# Patient Record
Sex: Female | Born: 2000 | Race: Black or African American | Hispanic: No | Marital: Single | Smoking: Never smoker
Health system: Southern US, Community
[De-identification: ages and names within clinical notes are randomized; demographics above are authoritative.]

## PROBLEM LIST (undated history)

## (undated) DIAGNOSIS — F419 Anxiety disorder, unspecified: Secondary | ICD-10-CM

## (undated) DIAGNOSIS — Z789 Other specified health status: Secondary | ICD-10-CM

## (undated) HISTORY — PX: NO PAST SURGERIES: SHX2092

## (undated) HISTORY — DX: Anxiety disorder, unspecified: F41.9

---
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
## 2019-02-19 ENCOUNTER — Emergency Department (HOSPITAL_COMMUNITY)
Admission: EM | Admit: 2019-02-19 | Discharge: 2019-02-19 | Disposition: A | Discharge status: Home / Self Care | Payer: Medicaid Other | Attending: Emergency Medicine | Admitting: Emergency Medicine

## 2019-02-19 ENCOUNTER — Other Ambulatory Visit: Payer: Self-pay

## 2019-02-19 ENCOUNTER — Encounter (HOSPITAL_COMMUNITY): Payer: Self-pay | Admitting: *Deleted

## 2019-02-19 DIAGNOSIS — Z79899 Other long term (current) drug therapy: Secondary | ICD-10-CM | POA: Diagnosis not present

## 2019-02-19 DIAGNOSIS — Y939 Activity, unspecified: Secondary | ICD-10-CM | POA: Insufficient documentation

## 2019-02-19 DIAGNOSIS — S61307A Unspecified open wound of left little finger with damage to nail, initial encounter: Secondary | ICD-10-CM | POA: Diagnosis not present

## 2019-02-19 DIAGNOSIS — S61317A Laceration without foreign body of left little finger with damage to nail, initial encounter: Secondary | ICD-10-CM | POA: Diagnosis not present

## 2019-02-19 DIAGNOSIS — Y929 Unspecified place or not applicable: Secondary | ICD-10-CM | POA: Diagnosis not present

## 2019-02-19 DIAGNOSIS — Y999 Unspecified external cause status: Secondary | ICD-10-CM | POA: Insufficient documentation

## 2019-02-19 DIAGNOSIS — X58XXXA Exposure to other specified factors, initial encounter: Secondary | ICD-10-CM | POA: Diagnosis not present

## 2019-02-19 DIAGNOSIS — S60922A Unspecified superficial injury of left hand, initial encounter: Secondary | ICD-10-CM | POA: Diagnosis present

## 2019-02-19 DIAGNOSIS — S61309A Unspecified open wound of unspecified finger with damage to nail, initial encounter: Secondary | ICD-10-CM

## 2019-02-19 MED ORDER — IBUPROFEN 800 MG PO TABS
800.0000 mg | ORAL_TABLET | Freq: Once | ORAL | Status: AC
  Administered 2019-02-19: 800 mg via ORAL
  Filled 2019-02-19: qty 1

## 2019-02-19 NOTE — Discharge Instructions (Addendum)
Keep a dressing on your finger to prevent re-injury of the nail.  Wash daily with soap and water, applying a new dressing each day.  A new nail will grow back in several months.  Use tylenol and ibuprofen for pain and swelling.  Use ice for pain and swelling.  Keep your pinky elevated when able.  Return to the ER if you develop fevers, severe worsening pain, pus draining form the area, it becomes very red/swollen, or with any new, worsening, or concerning symptoms.

## 2019-02-19 NOTE — ED Provider Notes (Signed)
[facility] EMERGENC[facility] DEPART[facility] Provider Note   CSN: [id number] Arrival date & time: 02/19/19  1761    History   Chief Complaint Chief Complaint  Patient presents with  . Nail Problem    HPI Rhonda Dixon is a 18 y.o. female presenting for evaluation of pinky injury.  Patient states just prior to arrival she jammed her left pinky, causing her fake nail attached to her real nail to lift up from the nailbed.  She reports acute onset pain and bleeding.  She soaked it in salt water, which stopped the bleeding.  She reports continued pain at the nail bed.  She denies injury elsewhere.  She denies numbness or tingling.  She has no medical problems, takes medications daily.  She is not on blood thinners.     HPI  History reviewed. No pertinent past medical history.  Patient Active Problem List   Diagnosis Date Noted  . Cough 11/22/2018  . Generalized muscle ache 11/22/2018  . Generalized anxiety disorder 10/23/2018  . Current severe episode of major depressive disorder without psychotic features without prior episode ([place]) 10/23/2018  . Hematuria 08/29/2018  . External hemorrhoid 08/22/2018  . 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 ALLERGIC RHINITIS, SEASONAL 04/01/2009    History reviewed. No pertinent surgical history.   OB History   No obstetric history on file.      Home Medications    Prior to Admission medications   Medication Sig Start Date End Date Taking? Authorizing Provider  cetirizine (ZYRTEC) 10 MG tablet Take 1 tablet (10 mg total) by mouth daily. 11/22/18   Everrett Coombe, MD  escitalopram (LEXAPRO) 10 MG tablet Take 0.5 tablets (5 mg total) by mouth daily. 10/23/18   Kathrene Alu, MD  ipratropium (ATROVENT) 0.06 % nasal spray Place 2 sprays into both nostrils 4 (four) times daily. 07/26/18   Tasia Catchings, Amy V, PA-C  omeprazole (PRILOSEC) 20 MG capsule Take 1 capsule (20 mg total) by mouth daily. 08/29/18   Steve Rattler, DO  ondansetron  (ZOFRAN) 4 MG tablet Take 1 tablet (4 mg total) by mouth every 8 (eight) hours as needed for nausea or vomiting. 08/29/18   Lucila Maine C, DO  polyethylene glycol powder (GLYCOLAX/MIRALAX) powder Take 17 g by mouth daily. 08/29/18   Steve Rattler, DO  predniSONE (DELTASONE) 50 MG tablet Take 1 tablet (50 mg total) by mouth daily. 07/26/18   Ok Edwards, PA-C    Family History Family History  Problem Relation Age of Onset  . Lupus Mother     Social History Social History   Tobacco Use  . Smoking status: Never Smoker  . Smokeless tobacco: Never Used  Substance Use Topics  . Alcohol use: No  . Drug use: No     Allergies   Patient has no known allergies.   Review of Systems Review of Systems  Skin:       Nail injury of L pinky  Hematological: Does not bruise/bleed easily.     Physical Exam Updated Vital Signs BP 125/78 (BP Location: Right Arm)   Pulse 95   Temp 98.5 F (36.9 C) (Oral)   Resp 14   Ht 5\' 4"  (1.626 m)   Wt 65.8 kg   SpO2 100%   BMI 24.89 kg/m   Physical Exam Vitals signs and nursing note reviewed.  Constitutional:      General: She is not in acute distress.    Appearance: She is well-developed.  HENT:  Head: Normocephalic and atraumatic.  Neck:     Musculoskeletal: Normal range of motion.  Pulmonary:     Effort: Pulmonary effort is normal.  Abdominal:     General: There is no distension.  Musculoskeletal: Normal range of motion.     Comments: Active range of motion of the pinky.  Distal sensation intact.  Good distal cap refill.  No tenderness palpation of the PIP or DIP.  Skin:    General: Skin is warm.     Capillary Refill: Capillary refill takes less than 2 seconds.     Findings: No rash.     Comments: Left pinky nail avulsed all except at the base of the nailbed.  No active bleeding.  Acrylic nail firmly attached to the real nail.  Neurological:     Mental Status: She is alert and oriented to person, place, and time.       ED Treatments / Results  Labs (all labs ordered are listed, but only abnormal results are displayed) Labs Reviewed - No data to display  EKG None  Radiology No results found.  Procedures Procedures (including critical care time)  Medications Ordered in ED Medications  ibuprofen (ADVIL) tablet 800 mg (800 mg Oral Given 02/19/19 [id number])     Initial Impression / Assessment and Plan / ED Course  I have reviewed the triage vital signs and the nursing notes.  Pertinent labs & imaging results that were available during my care of the patient were reviewed by me and considered in my medical decision making (see chart for details).        Patient presenting for evaluation of pinky nail injury.  Physical exam reassuring, she is neurovascularly intact.  On exam, she has almost complete avulsion of the pinky nail.  Discussed options of nail removal versus anchoring nail with sutures versus bulky dressing to apply pressure and decreased movement of the nail.  Patient does not want sutures or nail removal.  Discussed likelihood that nail will fall off, and that it will take several months for her new nail to grow back in.  Pt does not want to attempt removal of acrylic nail at this time. Discussed symptomatic treatment with daily cleaning, dressing changes, Tylenol, ibuprofen, elevation, and ice.  Encouraged close monitoring for signs of infection.  At this time, patient appears safe for discharge.  Return precautions given.  Patient states she understands and agrees to plan.   Final Clinical Impressions(s) / ED Diagnoses   Final diagnoses:  Avulsion of fingernail, initial encounter    ED Discharge Orders    None       Alveria ApleyCaccavale, Victorious Cosio, PA-C 02/19/19 1004    Pricilla LovelessGoldston, Scott, MD 02/22/19 [phone]

## 2019-02-19 NOTE — ED Triage Notes (Signed)
States she was wrestling around with a friend and her pinkly fingernail on the left got pushed backwards.

## 2019-02-19 NOTE — ED Triage Notes (Signed)
Pt in with L pinky nail injury. States her fake nail got pulled back, also pulling up her real nail from the bed.

## 2019-03-09 ENCOUNTER — Ambulatory Visit (INDEPENDENT_AMBULATORY_CARE_PROVIDER_SITE_OTHER): Payer: Medicaid Other | Admitting: Family Medicine

## 2019-03-09 ENCOUNTER — Other Ambulatory Visit: Payer: Self-pay

## 2019-03-09 VITALS — BP 100/70 | HR 94 | Temp 94.0°F

## 2019-03-09 DIAGNOSIS — F411 Generalized anxiety disorder: Secondary | ICD-10-CM

## 2019-03-09 DIAGNOSIS — N898 Other specified noninflammatory disorders of vagina: Secondary | ICD-10-CM

## 2019-03-09 MED ORDER — FLUCONAZOLE 150 MG PO TABS: 150.0000 mg | ORAL_TABLET | Freq: Once | ORAL | 0 refills | Status: AC

## 2019-03-09 NOTE — Assessment & Plan Note (Signed)
History appears consistent with vaginal candidiasis.  She was advised to move forward with comprehensive STI treatment but she preferred to hold off on any STI work-up because she was recently tested and has not had any new partners.  She has never had a speculum exam performed before and there are significant difficulties with this particular exam.  A standard and small speculum were used unsuccessfully.  In order to avoid additional trauma, she will be treated empirically for yeast infection was encouraged to return if she did not experience resolution of her symptoms. -Diflucan 150 once

## 2019-03-09 NOTE — Progress Notes (Signed)
    Subjective:  Rhonda Dixon is a 18 y.o. female who presents to the [facility] today with a chief complaint of vaginal discharge.   HPI: She reports that she has been having dysuria, vaginal itching/irritation, clear chunky discharge for about 2 weeks.  She reports this feels very similar to previous yeast infection.  She reports that she has recently been screened for gonorrhea, chlamydia, HIV, RPR.  She denies fevers, abdominal pain, back pain.  She reports that she is sexually active with another female.  She reports that there is no way she could be pregnant.  Chief Complaint noted Review of Symptoms - see HPI  Objective:  Physical Exam: BP 100/70   Pulse 94   Temp (!) 94 F (34.4 C)   SpO2 98%    Gen: NAD, resting comfortably Pelvic: Normal vaginal appearance and healthy appearing mucosa.  No significant malodor or discharge appreciated on exam.  Pelvic exam was complicated in the initial speculum was not able to progress.  A smaller speculum was used with the same result.  No results found for this or any previous visit (from the past 72 hour(s)).   Assessment/Plan:  Vaginal discharge History appears consistent with vaginal candidiasis.  She was advised to move forward with comprehensive STI treatment but she preferred to hold off on any STI work-up because she was recently tested and has not had any new partners.  She has never had a speculum exam performed before and there are significant difficulties with this particular exam.  A standard and small speculum were used unsuccessfully.  In order to avoid additional trauma, she will be treated empirically for yeast infection was encouraged to return if she did not experience resolution of her symptoms. -Diflucan 150 once  Generalized anxiety disorder She reports that she has been out of her medication, escitalopram, for about 2 months.  She would like a refill.  She never benefited significantly from this medication.  She was  encouraged to return to clinic at a later date for a more in-depth conversation about her anxiety/depression to fully address this issue.  She denies SI/HI at this time.  She feels comfortable rescheduling for later date.

## 2019-03-09 NOTE — Assessment & Plan Note (Signed)
She reports that she has been out of her medication, escitalopram, for about 2 months.  She would like a refill.  She never benefited significantly from this medication.  She was encouraged to return to clinic at a later date for a more in-depth conversation about her anxiety/depression to fully address this issue.  She denies SI/HI at this time.  She feels comfortable rescheduling for later date.

## 2019-03-09 NOTE — Patient Instructions (Signed)
I have prescribed a one-time pill for a yeast infection. If you do not experience significant symptom resolution in the next 4-5 days, I would consider returning for lab work.    I'm sorry that we did not have more time to discuss your experience with depression so far.  I think it would be really beneficial to have a visit dedicated to that.

## 2019-03-09 NOTE — Progress Notes (Signed)
wet 

## 2019-06-26 ENCOUNTER — Encounter (HOSPITAL_COMMUNITY): Payer: Self-pay

## 2019-06-26 ENCOUNTER — Ambulatory Visit (HOSPITAL_COMMUNITY)
Admission: EM | Admit: 2019-06-26 | Discharge: 2019-06-26 | Disposition: A | Discharge status: Home / Self Care | Payer: Medicaid Other | Attending: Family Medicine | Admitting: Family Medicine

## 2019-06-26 DIAGNOSIS — R5383 Other fatigue: Secondary | ICD-10-CM | POA: Diagnosis not present

## 2019-06-26 DIAGNOSIS — J069 Acute upper respiratory infection, unspecified: Secondary | ICD-10-CM | POA: Insufficient documentation

## 2019-06-26 DIAGNOSIS — R05 Cough: Secondary | ICD-10-CM | POA: Diagnosis not present

## 2019-06-26 DIAGNOSIS — Z20822 Contact with and (suspected) exposure to covid-19: Secondary | ICD-10-CM

## 2019-06-26 DIAGNOSIS — Z20828 Contact with and (suspected) exposure to other viral communicable diseases: Secondary | ICD-10-CM | POA: Diagnosis not present

## 2019-06-26 DIAGNOSIS — J029 Acute pharyngitis, unspecified: Secondary | ICD-10-CM | POA: Diagnosis not present

## 2019-06-26 DIAGNOSIS — R0981 Nasal congestion: Secondary | ICD-10-CM | POA: Diagnosis not present

## 2019-06-26 DIAGNOSIS — R059 Cough, unspecified: Secondary | ICD-10-CM

## 2019-06-26 MED ORDER — PROMETHAZINE-DM 6.25-15 MG/5ML PO SYRP: 5.0000 mL | ORAL_SOLUTION | Freq: Four times a day (QID) | ORAL | 0 refills | Status: DC | PRN

## 2019-06-26 NOTE — ED Triage Notes (Addendum)
Pt reports sore throat, nasal congestion, cough and low energy x 1 week.

## 2019-06-26 NOTE — Discharge Instructions (Addendum)
Take tylenol for pain and fever Take cough medicine as needed Quarantine at home until covid test results available Call for questions     Person Under Monitoring Name: Rhonda Dixon  Location: [address]  [place] [id number]   Infection Prevention Recommendations for Individuals Confirmed to have, or Being Evaluated for, 2019 Novel Coronavirus (COVID-19) Infection Who Receive Care at Home  Individuals who are confirmed to have, or are being evaluated for, COVID-19 should follow the prevention steps below until a healthcare provider or local or state health department says they can return to normal activities.  Stay home except to get medical care You should restrict activities outside your home, except for getting medical care. Do not go to work, school, or public areas, and do not use public transportation or taxis.  Call ahead before visiting your doctor Before your medical appointment, call the healthcare provider and tell them that you have, or are being evaluated for, COVID-19 infection. This will help the healthcare providers office take steps to keep other people from getting infected. Ask your healthcare provider to call the local or state health department.  Monitor your symptoms Seek prompt medical attention if your illness is worsening (e.g., difficulty breathing). Before going to your medical appointment, call the healthcare provider and tell them that you have, or are being evaluated for, COVID-19 infection. Ask your healthcare provider to call the local or state health department.  Wear a facemask You should wear a facemask that covers your nose and mouth when you are in the same room with other people and when you visit a healthcare provider. People who live with or visit you should also wear a facemask while they are in the same room with you.  Separate yourself from other people in your home As much as possible, you should stay in a different room  from other people in your home. Also, you should use a separate bathroom, if available.  Avoid sharing household items You should not share dishes, drinking glasses, cups, eating utensils, towels, bedding, or other items with other people in your home. After using these items, you should wash them thoroughly with soap and water.  Cover your coughs and sneezes Cover your mouth and nose with a tissue when you cough or sneeze, or you can cough or sneeze into your sleeve. Throw used tissues in a lined trash can, and immediately wash your hands with soap and water for at least 20 seconds or use an alcohol-based hand rub.  Wash your [employer] Wash your hands often and thoroughly with soap and water for at least 20 seconds. You can use an alcohol-based hand sanitizer if soap and water are not available and if your hands are not visibly dirty. Avoid touching your eyes, nose, and mouth with unwashed hands.   Prevention Steps for Caregivers and Household Members of Individuals Confirmed to have, or Being Evaluated for, COVID-19 Infection Being Cared for in the Home  If you live with, or provide care at home for, a person confirmed to have, or being evaluated for, COVID-19 infection please follow these guidelines to prevent infection:  Follow healthcare providers instructions Make sure that you understand and can help the patient follow any healthcare provider instructions for all care.  Provide for the patients basic needs You should help the patient with basic needs in the home and provide support for getting groceries, prescriptions, and other personal needs.  Monitor the patients symptoms If they are getting sicker, call his  or her medical provider and tell them that the patient has, or is being evaluated for, COVID-19 infection. This will help the healthcare providers office take steps to keep other people from getting infected. Ask the healthcare provider to call the local or state  health department.  Limit the number of people who have contact with the patient If possible, have only one caregiver for the patient. Other household members should stay in another home or place of residence. If this is not possible, they should stay in another room, or be separated from the patient as much as possible. Use a separate bathroom, if available. Restrict visitors who do not have an essential need to be in the home.  Keep older adults, very young children, and other sick people away from the patient Keep older adults, very young children, and those who have compromised immune systems or chronic health conditions away from the patient. This includes people with chronic heart, lung, or kidney conditions, diabetes, and cancer.  Ensure good ventilation Make sure that shared spaces in the home have good air flow, such as from an air conditioner or an opened window, weather permitting.  Wash your hands often Wash your hands often and thoroughly with soap and water for at least 20 seconds. You can use an alcohol based hand sanitizer if soap and water are not available and if your hands are not visibly dirty. Avoid touching your eyes, nose, and mouth with unwashed hands. Use disposable paper towels to dry your hands. If not available, use dedicated cloth towels and replace them when they become wet.  Wear a facemask and gloves Wear a disposable facemask at all times in the room and gloves when you touch or have contact with the patients blood, body fluids, and/or secretions or excretions, such as sweat, saliva, sputum, nasal mucus, vomit, urine, or feces.  Ensure the mask fits over your nose and mouth tightly, and do not touch it during use. Throw out disposable facemasks and gloves after using them. Do not reuse. Wash your hands immediately after removing your facemask and gloves. If your personal clothing becomes contaminated, carefully remove clothing and launder. Wash your hands  after handling contaminated clothing. Place all used disposable facemasks, gloves, and other waste in a lined container before disposing them with other household waste. Remove gloves and wash your hands immediately after handling these items.  Do not share dishes, glasses, or other household items with the patient Avoid sharing household items. You should not share dishes, drinking glasses, cups, eating utensils, towels, bedding, or other items with a patient who is confirmed to have, or being evaluated for, COVID-19 infection. After the person uses these items, you should wash them thoroughly with soap and water.  Wash laundry thoroughly Immediately remove and wash clothes or bedding that have blood, body fluids, and/or secretions or excretions, such as sweat, saliva, sputum, nasal mucus, vomit, urine, or feces, on them. Wear gloves when handling laundry from the patient. Read and follow directions on labels of laundry or clothing items and detergent. In general, wash and dry with the warmest temperatures recommended on the label.  Clean all areas the individual has used often Clean all touchable surfaces, such as counters, tabletops, doorknobs, bathroom fixtures, toilets, phones, keyboards, tablets, and bedside tables, every day. Also, clean any surfaces that may have blood, body fluids, and/or secretions or excretions on them. Wear gloves when cleaning surfaces the patient has come in contact with. Use a diluted bleach solution (e.g., dilute  bleach with 1 part bleach and 10 parts water) or a household disinfectant with a label that says EPA-registered for coronaviruses. To make a bleach solution at home, add 1 tablespoon of bleach to 1 quart (4 cups) of water. For a larger supply, add  cup of bleach to 1 gallon (16 cups) of water. Read labels of cleaning products and follow recommendations provided on product labels. Labels contain instructions for safe and effective use of the cleaning product  including precautions you should take when applying the product, such as wearing gloves or eye protection and making sure you have good ventilation during use of the product. Remove gloves and wash hands immediately after cleaning.  Monitor yourself for signs and symptoms of illness Caregivers and household members are considered close contacts, should monitor their health, and will be asked to limit movement outside of the home to the extent possible. Follow the monitoring steps for close contacts listed on the symptom monitoring form.   ? If you have additional questions, contact your local health department or call the epidemiologist on call at [phone] (available 24/7). ? This guidance is subject to change. For the most up-to-date guidance from [facility], please refer to their website: [email or web address]

## 2019-06-26 NOTE — ED Provider Notes (Signed)
[facility]    CSN: [id number] Arrival date & time: 06/26/19  0849      History   Chief Complaint Chief Complaint  Patient presents with   Sore Throat   Cough   Fatigue   Nasal Congestion    HPI Rhonda Dixon is a 18 y.o. female.   HPI  Patient states she has had some sweats and chills.  Did not take her temperature.  She has significant fatigue acute.  Cough sore throat and runny nose for about a week.  She tried to go to work today.  She was too sick to continue.  She was sent home and told to get testing.  She does not have any known exposure to coronavirus.  She does work in a [employer] and is around a lot of people.  Everyone at home as well.  Her sense of taste and smell is good.  Appetite poor.  She is drinking fluids.  No nausea vomiting diarrhea.    History reviewed. No pertinent past medical history.  Patient Active Problem List   Diagnosis Date Noted   Cough 11/22/2018   Generalized muscle ache 11/22/2018   Generalized anxiety disorder 10/23/2018   Current severe episode of major depressive disorder without psychotic features without prior episode (HCC) 10/23/2018   Hematuria 08/29/2018   External hemorrhoid 08/22/2018   Vaginal discharge 07/12/2017   Nocturnal enuresis 01/04/2013   ALLERGIC RHINITIS, SEASONAL 04/01/2009    History reviewed. No pertinent surgical history.  OB History   No obstetric history on file.      Home Medications    Prior to Admission medications   Medication Sig Start Date End Date Taking? Authorizing Provider  cetirizine (ZYRTEC) 10 MG tablet Take 1 tablet (10 mg total) by mouth daily. 11/22/18   Howard PouchFeng, Lauren, MD  escitalopram (LEXAPRO) 10 MG tablet Take 0.5 tablets (5 mg total) by mouth daily. 10/23/18   Lennox SoldersWinfrey, Amanda C, MD  ipratropium (ATROVENT) 0.06 % nasal spray Place 2 sprays into both nostrils 4 (four) times daily. 07/26/18   Cathie HoopsYu, Amy V, PA-C  promethazine-dextromethorphan  (PROMETHAZINE-DM) 6.25-15 MG/5ML syrup Take 5 mLs by mouth 4 (four) times daily as needed for cough. 06/26/19   Eustace MooreNelson, Jaleigh Mccroskey Sue, MD  omeprazole (PRILOSEC) 20 MG capsule Take 1 capsule (20 mg total) by mouth daily. 08/29/18 06/26/19  Tillman Sersiccio, Angela C, DO    Family History Family History  Problem Relation Age of Onset   Lupus Mother     Social History Social History   Tobacco Use   Smoking status: Never Smoker   Smokeless tobacco: Never Used  Substance Use Topics   Alcohol use: No   Drug use: No     Allergies   Patient has no known allergies.   Review of Systems Review of Systems  Constitutional: Positive for fatigue and fever. Negative for chills.       Did not take temperature  HENT: Positive for congestion, rhinorrhea and sore throat. Negative for ear pain.   Eyes: Negative for pain and visual disturbance.  Respiratory: Positive for cough. Negative for shortness of breath.   Cardiovascular: Negative for chest pain and palpitations.  Gastrointestinal: Negative for abdominal pain and vomiting.  Genitourinary: Negative for dysuria and hematuria.  Musculoskeletal: Negative for arthralgias and back pain.  Skin: Negative for color change and rash.  Neurological: Negative for seizures, syncope and headaches.  All other systems reviewed and are negative.    Physical Exam Triage Vital  Signs ED Triage Vitals  Enc Vitals Group     BP 06/26/19 0909 102/71     Pulse Rate 06/26/19 0909 78     Resp 06/26/19 0909 15     Temp 06/26/19 0909 97.7 F (36.5 C)     Temp Source 06/26/19 0909 Temporal     SpO2 06/26/19 0909 100 %     Weight --      Height --      Head Circumference --      Peak Flow --      Pain Score 06/26/19 0906 2     Pain Loc --      Pain Edu? --      Excl. in GC? --    No data found.  Updated Vital Signs BP 102/71 (BP Location: Right Arm)    Pulse 78    Temp 97.7 F (36.5 C) (Temporal)    Resp 15    SpO2 100%       Physical  Exam Constitutional:      General: She is not in acute distress.    Appearance: She is well-developed.  HENT:     Head: Normocephalic and atraumatic.     Right Ear: Tympanic membrane and ear canal normal.     Left Ear: Tympanic membrane and ear canal normal.     Nose: Rhinorrhea present.     Mouth/Throat:     Mouth: Mucous membranes are moist.     Pharynx: Posterior oropharyngeal erythema present.     Comments: Mild redness.  No exudate Eyes:     Conjunctiva/sclera: Conjunctivae normal.     Pupils: Pupils are equal, round, and reactive to light.  Neck:     Musculoskeletal: Normal range of motion.  Cardiovascular:     Rate and Rhythm: Normal rate and regular rhythm.  Pulmonary:     Effort: Pulmonary effort is normal. No respiratory distress.     Breath sounds: No wheezing or rales.  Abdominal:     General: There is no distension.     Palpations: Abdomen is soft.  Musculoskeletal: Normal range of motion.  Lymphadenopathy:     Cervical: Cervical adenopathy present.  Skin:    General: Skin is warm and dry.  Neurological:     General: No focal deficit present.     Mental Status: She is alert.  Psychiatric:        Mood and Affect: Mood normal.        Behavior: Behavior normal.     Comments: Appears tired      UC Treatments / Results  Labs (all labs ordered are listed, but only abnormal results are displayed) Labs Reviewed  NOVEL CORONAVIRUS, NAA (HOSP ORDER, SEND-OUT TO REF LAB; TAT 18-24 HRS)    EKG   Radiology No results found.  Procedures Procedures (including critical care time)  Medications Ordered in UC Medications - No data to display  Initial Impression / Assessment and Plan / UC Course  I have reviewed the triage vital signs and the nursing notes.  Pertinent labs & imaging results that were available during my care of the patient were reviewed by me and considered in my medical decision making (see chart for details).     Discussed she likely has  a viral illness.  Impossible to tell if it is coronavirus.  Covid testing is done.  Importance of quarantine is reviewed. Final Clinical Impressions(s) / UC Diagnoses   Final diagnoses:  Cough  Viral upper respiratory tract  infection  Suspected COVID-19 virus infection     Discharge Instructions     Take tylenol for pain and fever Take cough medicine as needed Quarantine at home until covid test results available Call for questions     Person Under Monitoring Name: JAMYIA FORTUNE  Location: [address]  [place] [address]   Infection Prevention Recommendations for Individuals Confirmed to have, or Being Evaluated for, 2019 Novel Coronavirus (COVID-19) Infection Who Receive Care at Home  Individuals who are confirmed to have, or are being evaluated for, COVID-19 should follow the prevention steps below until a healthcare provider or local or state health department says they can return to normal activities.  Stay home except to get medical care You should restrict activities outside your home, except for getting medical care. Do not go to work, school, or public areas, and do not use public transportation or taxis.  Call ahead before visiting your doctor Before your medical appointment, call the healthcare provider and tell them that you have, or are being evaluated for, COVID-19 infection. This will help the healthcare providers office take steps to keep other people from getting infected. Ask your healthcare provider to call the local or state health department.  Monitor your symptoms Seek prompt medical attention if your illness is worsening (e.g., difficulty breathing). Before going to your medical appointment, call the healthcare provider and tell them that you have, or are being evaluated for, COVID-19 infection. Ask your healthcare provider to call the local or state health department.  Wear a facemask You should wear a facemask that covers your nose and  mouth when you are in the same room with other people and when you visit a healthcare provider. People who live with or visit you should also wear a facemask while they are in the same room with you.  Separate yourself from other people in your home As much as possible, you should stay in a different room from other people in your home. Also, you should use a separate bathroom, if available.  Avoid sharing household items You should not share dishes, drinking glasses, cups, eating utensils, towels, bedding, or other items with other people in your home. After using these items, you should wash them thoroughly with soap and water.  Cover your coughs and sneezes Cover your mouth and nose with a tissue when you cough or sneeze, or you can cough or sneeze into your sleeve. Throw used tissues in a lined trash can, and immediately wash your hands with soap and water for at least 20 seconds or use an alcohol-based hand rub.  Wash your [employer] your hands often and thoroughly with soap and water for at least 20 seconds. You can use an alcohol-based hand sanitizer if soap and water are not available and if your hands are not visibly dirty. Avoid touching your eyes, nose, and mouth with unwashed hands.   Prevention Steps for Caregivers and Household Members of Individuals Confirmed to have, or Being Evaluated for, COVID-19 Infection Being Cared for in the Home  If you live with, or provide care at home for, a person confirmed to have, or being evaluated for, COVID-19 infection please follow these guidelines to prevent infection:  Follow healthcare providers instructions Make sure that you understand and can help the patient follow any healthcare provider instructions for all care.  Provide for the patients basic needs You should help the patient with basic needs in the home and provide support for getting groceries, prescriptions,  and other personal needs.  Monitor the patients  symptoms If they are getting sicker, call his or her medical provider and tell them that the patient has, or is being evaluated for, COVID-19 infection. This will help the healthcare providers office take steps to keep other people from getting infected. Ask the healthcare provider to call the local or state health department.  Limit the number of people who have contact with the patient  If possible, have only one caregiver for the patient.  Other household members should stay in another home or place of residence. If this is not possible, they should stay  in another room, or be separated from the patient as much as possible. Use a separate bathroom, if available.  Restrict visitors who do not have an essential need to be in the home.  Keep older adults, very young children, and other sick people away from the patient Keep older adults, very young children, and those who have compromised immune systems or chronic health conditions away from the patient. This includes people with chronic heart, lung, or kidney conditions, diabetes, and cancer.  Ensure good ventilation Make sure that shared spaces in the home have good air flow, such as from an air conditioner or an opened window, weather permitting.  Wash your hands often  Wash your hands often and thoroughly with soap and water for at least 20 seconds. You can use an alcohol based hand sanitizer if soap and water are not available and if your hands are not visibly dirty.  Avoid touching your eyes, nose, and mouth with unwashed hands.  Use disposable paper towels to dry your hands. If not available, use dedicated cloth towels and replace them when they become wet.  Wear a facemask and gloves  Wear a disposable facemask at all times in the room and gloves when you touch or have contact with the patients blood, body fluids, and/or secretions or excretions, such as sweat, saliva, sputum, nasal mucus, vomit, urine, or feces.  Ensure  the mask fits over your nose and mouth tightly, and do not touch it during use.  Throw out disposable facemasks and gloves after using them. Do not reuse.  Wash your hands immediately after removing your facemask and gloves.  If your personal clothing becomes contaminated, carefully remove clothing and launder. Wash your hands after handling contaminated clothing.  Place all used disposable facemasks, gloves, and other waste in a lined container before disposing them with other household waste.  Remove gloves and wash your hands immediately after handling these items.  Do not share dishes, glasses, or other household items with the patient  Avoid sharing household items. You should not share dishes, drinking glasses, cups, eating utensils, towels, bedding, or other items with a patient who is confirmed to have, or being evaluated for, COVID-19 infection.  After the person uses these items, you should wash them thoroughly with soap and water.  Wash laundry thoroughly  Immediately remove and wash clothes or bedding that have blood, body fluids, and/or secretions or excretions, such as sweat, saliva, sputum, nasal mucus, vomit, urine, or feces, on them.  Wear gloves when handling laundry from the patient.  Read and follow directions on labels of laundry or clothing items and detergent. In general, wash and dry with the warmest temperatures recommended on the label.  Clean all areas the individual has used often  Clean all touchable surfaces, such as counters, tabletops, doorknobs, bathroom fixtures, toilets, phones, keyboards, tablets, and bedside tables, every day.  Also, clean any surfaces that may have blood, body fluids, and/or secretions or excretions on them.  Wear gloves when cleaning surfaces the patient has come in contact with.  Use a diluted bleach solution (e.g., dilute bleach with 1 part bleach and 10 parts water) or a household disinfectant with a label that says  EPA-registered for coronaviruses. To make a bleach solution at home, add 1 tablespoon of bleach to 1 quart (4 cups) of water. For a larger supply, add  cup of bleach to 1 gallon (16 cups) of water.  Read labels of cleaning products and follow recommendations provided on product labels. Labels contain instructions for safe and effective use of the cleaning product including precautions you should take when applying the product, such as wearing gloves or eye protection and making sure you have good ventilation during use of the product.  Remove gloves and wash hands immediately after cleaning.  Monitor yourself for signs and symptoms of illness Caregivers and household members are considered close contacts, should monitor their health, and will be asked to limit movement outside of the home to the extent possible. Follow the monitoring steps for close contacts listed on the symptom monitoring form.   ? If you have additional questions, contact your local health department or call the epidemiologist on call at [phone] (available 24/7). ? This guidance is subject to change. For the most up-to-date guidance from [facility], please refer to their website: [email or web address]    ED Prescriptions    Medication Sig Dispense Auth. Provider   promethazine-dextromethorphan (PROMETHAZINE-DM) 6.25-15 MG/5ML syrup Take 5 mLs by mouth 4 (four) times daily as needed for cough. 118 mL Eustace Moore, MD     PDMP not reviewed this encounter.   Eustace Moore, MD 06/26/19 1105

## 2019-06-27 LAB — NOVEL CORONAVIRUS, NAA (HOSP ORDER, SEND-OUT TO REF LAB; TAT 18-24 HRS): SARS-CoV-2, NAA: NOT DETECTED

## 2019-07-25 ENCOUNTER — Ambulatory Visit: Payer: Medicaid Other

## 2019-07-27 ENCOUNTER — Ambulatory Visit: Payer: Medicaid Other | Admitting: Family Medicine

## 2019-07-30 ENCOUNTER — Ambulatory Visit (INDEPENDENT_AMBULATORY_CARE_PROVIDER_SITE_OTHER): Payer: Medicaid Other | Admitting: Student in an Organized Health Care Education/Training Program

## 2019-07-30 ENCOUNTER — Other Ambulatory Visit: Payer: Self-pay

## 2019-07-30 VITALS — BP 110/80 | HR 89 | Wt 159.8 lb

## 2019-07-30 DIAGNOSIS — R112 Nausea with vomiting, unspecified: Secondary | ICD-10-CM | POA: Diagnosis not present

## 2019-07-30 DIAGNOSIS — K219 Gastro-esophageal reflux disease without esophagitis: Secondary | ICD-10-CM | POA: Diagnosis not present

## 2019-07-30 DIAGNOSIS — N898 Other specified noninflammatory disorders of vagina: Secondary | ICD-10-CM

## 2019-07-30 LAB — POCT WET PREP (WET MOUNT)
Clue Cells Wet Prep Whiff POC: NEGATIVE
Trichomonas Wet Prep HPF POC: ABSENT

## 2019-07-30 LAB — POCT URINE PREGNANCY: Preg Test, Ur: NEGATIVE

## 2019-07-30 MED ORDER — FLUCONAZOLE 150 MG PO TABS: 150.0000 mg | ORAL_TABLET | Freq: Once | ORAL | 0 refills | Status: AC

## 2019-07-30 NOTE — Patient Instructions (Addendum)
It was a pleasure to see you today!  To summarize our discussion for this visit:  We are testing for gonorrhea, chlamydia, yeast, trichomonas.  I will mail you results unless there is an abnormal and I will call you.  I highly recommend you consider trying a type of birth control if you do not want to become pregnant.  I have provided some information on different options here.  I recommend trying over-the-counter acid reflux treatments.  If that does not seem like it is helping please come back to see your PCP.  Some additional health maintenance measures we should update are: Health Maintenance Due  Topic Date Due  . INFLUENZA VACCINE  04/14/2019  .    Call the clinic at [phone] if your symptoms worsen or you have any concerns.   Thank you for allowing me to take part in your care,  Dr. Jamelle Rushinghelsey Anderson   Contraception Choices Contraception, also called birth control, means things to use or ways to try not to get pregnant. Hormonal birth control This kind of birth control uses hormones. Here are some types of hormonal birth control:  A tube that is put under skin of the arm (implant). The tube can stay in for as long as 3 years.  Shots to get every 3 months (injections).  Pills to take every day (birth control pills).  A patch to change 1 time each week for 3 weeks (birth control patch). After that, the patch is taken off for 1 week.  A ring to put in the vagina. The ring is left in for 3 weeks. Then it is taken out of the vagina for 1 week. Then a new ring is put in.  Pills to take after unprotected sex (emergency birth control pills). Barrier birth control Here are some types of barrier birth control:  A thin covering that is put on the penis before sex (female condom). The covering is thrown away after sex.  A soft, loose covering that is put in the vagina before sex (female condom). The covering is thrown away after sex.  A rubber bowl that sits over the cervix  (diaphragm). The bowl must be made for you. The bowl is put into the vagina before sex. The bowl is left in for 6-8 hours after sex. It is taken out within 24 hours.  A small, soft cup that fits over the cervix (cervical cap). The cup must be made for you. The cup can be left in for 6-8 hours after sex. It is taken out within 48 hours.  A sponge that is put into the vagina before sex. It must be left in for at least 6 hours after sex. It must be taken out within 30 hours. Then it is thrown away.  A chemical that kills or stops sperm from getting into the uterus (spermicide). It may be a pill, cream, jelly, or foam to put in the vagina. The chemical should be used at least 10-15 minutes before sex. IUD (intrauterine) birth control An IUD is a small, T-shaped piece of plastic. It is put inside the uterus. There are two kinds:  Hormone IUD. This kind can stay in for 3-5 years.  Copper IUD. This kind can stay in for 10 years. Permanent birth control Here are some types of permanent birth control:  Surgery to block the fallopian tubes.  Having an insert put into each fallopian tube.  Surgery to tie off the tubes that carry sperm (vasectomy). Natural planning birth  control Here are some types of natural planning birth control:  Not having sex on the days the woman could get pregnant.  Using a calendar: ? To keep track of the length of each period. ? To find out what days pregnancy can happen. ? To plan to not have sex on days when pregnancy can happen.  Watching for symptoms of ovulation and not having sex during ovulation. One way the woman can check for ovulation is to check her temperature.  Waiting to have sex until after ovulation. Summary  Contraception, also called birth control, means things to use or ways to try not to get pregnant.  Hormonal methods of birth control include implants, injections, pills, patches, vaginal rings, and emergency birth control pills.  Barrier  methods of birth control can include female condoms, female condoms, diaphragms, cervical caps, sponges, and spermicides.  There are two types of IUD (intrauterine device) birth control. An IUD can be put in a woman's uterus to prevent pregnancy for 3-5 years.  Permanent sterilization can be done through a procedure for males, females, or both.  Natural planning methods involve not having sex on the days when the woman could get pregnant. This information is not intended to replace advice given to you by your health care provider. Make sure you discuss any questions you have with your health care provider. Document Released: 06/27/2009 Document Revised: 12/20/2018 Document Reviewed: 09/09/2016 Elsevier Patient Education  2020 [place] Sex Practicing safe sex means taking steps before and during sex to reduce your risk of:  Getting an STI (sexually transmitted infection).  Giving your partner an STI.  Unwanted or unplanned pregnancy. How can I practice safe sex?     Ways you can practice safe sex  Limit your sexual partners to only one partner who is having sex with only you.  Avoid using alcohol and drugs before having sex. Alcohol and drugs can affect your judgment.  Before having sex with a new partner: ? Talk to your partner about past partners, past STIs, and drug use. ? Get screened for STIs and discuss the results with your partner. Ask your partner to get screened, too.  Check your body regularly for sores, blisters, rashes, or unusual discharge. If you notice any of these problems, visit your health care provider.  Avoid sexual contact if you have symptoms of an infection or you are being treated for an STI.  While having sex, use a condom. Make sure to: ? Use a condom every time you have vaginal, oral, or anal sex. Both females and males should wear condoms during oral sex. ? Keep condoms in place from the beginning to the end of sexual activity. ? Use a  latex condom, if possible. Latex condoms offer the best protection. ? Use only water-based lubricants with a condom. Using petroleum-based lubricants or oils will weaken the condom and increase the chance that it will break. Ways your health care provider can help you practice safe sex  See your health care provider for regular screenings, exams, and tests for STIs.  Talk with your health care provider about what kind of birth control (contraception) is best for you.  Get vaccinated against hepatitis B and human papillomavirus (HPV).  If you are at risk of being infected with HIV (human immunodeficiency virus), talk with your health care provider about taking a prescription medicine to prevent HIV infection. You are at risk for HIV if you: ? Are a man who has sex  with other men. ? Are sexually active with more than one partner. ? Take drugs by injection. ? Have a sex partner who has HIV. ? Have unprotected sex. ? Have sex with someone who has sex with both men and women. ? Have had an STI. Follow these instructions at home:  Take over-the-counter and prescription medicines as told by your health care provider.  Keep all follow-up visits as told by your health care provider. This is important. Where to find more information  Centers for Disease Control and Prevention: LessFurniture.be  Planned Parenthood: https://www.plannedparenthood.org/  Office on Women's Health: EmploymentTracking.tn Summary  Practicing safe sex means taking steps before and during sex to reduce your risk of STIs, giving your partner STIs, and having an unwanted or unplanned pregnancy.  Before having sex with a new partner, talk to your partner about past partners, past STIs, and drug use.  Use a condom every time you have vaginal, oral, or anal sex. Both females and males should wear condoms during oral sex.  Check your body  regularly for sores, blisters, rashes, or unusual discharge. If you notice any of these problems, visit your health care provider.  See your health care provider for regular screenings, exams, and tests for STIs. This information is not intended to replace advice given to you by your health care provider. Make sure you discuss any questions you have with your health care provider. Document Released: 10/07/2004 Document Revised: 12/22/2018 Document Reviewed: 06/12/2018 Elsevier Patient Education  2020 [employer].

## 2019-07-30 NOTE — Progress Notes (Signed)
   Subjective:    Patient ID: Rhonda Dixon, female    DOB: 05-30-2001, 18 y.o.   MRN: [id number]  CC: STD and pregnancy test  HPI:  18 year old female who is sexually active with same female partner for the last 2 years presents with concerns for pregnancy and STDs.  The last time she had intercourse was November 7 which was unprotected- did not use "pull out" method.  She used Plan B 3 days after that interaction and her period began on the 9th which was normal for her other than increased abdominal cramping.  She took a pregnancy test 2 days after the intercourse which was negative.  She would like to be tested for pregnancy and STDs today.  Declines HIV and syphilis.  Denies dysuria, vaginal pruritus.  Endorses having increased vaginal discharge which is white and thick.  Has never been on birth control and is not considering at this time as her sister gained weight when she was on birth control.  Additionally, patient states that she has had several episodes of nausea and vomiting following fast food consumption which is worsened by laying down after eating.  She feels like she has epigastric burning pain that sometimes radiates to her back especially when she is laying down.  She usually takes to leave for the pain and takes it up to 2 times per week before bed.  She thinks it helps because she feels better in the morning.  Smoking status reviewed   ROS: pertinent noted in the HPI   I have personally reviewed pertinent past medical history, surgical, family, and social history as appropriate. Objective:  BP 110/80   Pulse 89   Wt 159 lb 12.8 oz (72.5 kg)   SpO2 100%   BMI 27.43 kg/m   Vitals and nursing note reviewed  General: NAD, pleasant, able to participate in exam Pelvic: External genitalia negative for lesions.  Vaginal os positive for moderate white discharge.  Negative for bleeding or lesions. Extremities: no edema or cyanosis. Skin: warm and dry, no rashes noted Neuro:  alert, no obvious focal deficits Psych: Normal affect and mood  Assessment & Plan:   Vaginal discharge Performed pelvic exam with GC chlamydia probe and wet prep which was positive for moderate yeast. Urine pregnancy was negative. I called to inform the patient and prescribed Diflucan. She had no questions or concerns at this time. Will send GC/chlamydia results when back.   GERD (gastroesophageal reflux disease) History is highly suspicious of GERD. Recommended patient stop taking NSAIDs and instead try some over-the-counter antacid medications.  Provided with examples. Asked patient to return if symptoms do not improve for further work-up.  Orders Placed This Encounter  Procedures  . POCT urine pregnancy  . POCT Wet Prep [facility])    Meds ordered this encounter  Medications  . fluconazole (DIFLUCAN) 150 MG tablet    Sig: Take 1 tablet (150 mg total) by mouth once for 1 dose.    Dispense:  1 tablet    Refill:  0    Doristine Mango, [facility] PGY-2

## 2019-07-30 NOTE — Assessment & Plan Note (Signed)
Performed pelvic exam with GC chlamydia probe and wet prep which was positive for moderate yeast. Urine pregnancy was negative. I called to inform the patient and prescribed Diflucan. She had no questions or concerns at this time. Will send GC/chlamydia results when back.

## 2019-07-30 NOTE — Assessment & Plan Note (Signed)
History is highly suspicious of GERD. Recommended patient stop taking NSAIDs and instead try some over-the-counter antacid medications.  Provided with examples. Asked patient to return if symptoms do not improve for further work-up.

## 2019-07-31 ENCOUNTER — Other Ambulatory Visit: Payer: Self-pay | Admitting: Student in an Organized Health Care Education/Training Program

## 2019-07-31 ENCOUNTER — Other Ambulatory Visit (HOSPITAL_COMMUNITY)
Admission: RE | Admit: 2019-07-31 | Discharge: 2019-07-31 | Disposition: A | Discharge status: Home / Self Care | Payer: Medicaid Other | Source: Ambulatory Visit | Attending: Family Medicine | Admitting: Family Medicine

## 2019-07-31 DIAGNOSIS — N898 Other specified noninflammatory disorders of vagina: Secondary | ICD-10-CM

## 2019-07-31 NOTE — Addendum Note (Signed)
Addended by: Leonia Corona R on: 07/31/2019 10:39 AM   Modules accepted: Orders

## 2019-08-01 LAB — CERVICOVAGINAL ANCILLARY ONLY
Chlamydia: NEGATIVE
Comment: NEGATIVE
Comment: NORMAL
Neisseria Gonorrhea: NEGATIVE

## 2019-08-20 ENCOUNTER — Other Ambulatory Visit: Payer: Self-pay

## 2019-08-20 ENCOUNTER — Ambulatory Visit (INDEPENDENT_AMBULATORY_CARE_PROVIDER_SITE_OTHER): Payer: Medicaid Other | Admitting: Family Medicine

## 2019-08-20 ENCOUNTER — Encounter: Payer: Self-pay | Admitting: Family Medicine

## 2019-08-20 VITALS — BP 120/74 | HR 88 | Wt 158.0 lb

## 2019-08-20 DIAGNOSIS — Z3200 Encounter for pregnancy test, result unknown: Secondary | ICD-10-CM | POA: Diagnosis not present

## 2019-08-20 DIAGNOSIS — N926 Irregular menstruation, unspecified: Secondary | ICD-10-CM

## 2019-08-20 LAB — POCT URINE PREGNANCY: Preg Test, Ur: NEGATIVE

## 2019-08-20 NOTE — Patient Instructions (Signed)
It was great to see you!  Our plans for today:  - Your pregnancy test is negative. - Make sure to wear condoms every time you have sex for the full time to prevent against pregnancy and sexually transmitted infections. - I strongly encourage you to use birth control based you do not want to get pregnant.  Review be handout on different birth control options and let [place] know if you change your mind.  Take care and seek immediate care sooner if you develop any concerns.   Dr. [facility]

## 2019-08-20 NOTE — Progress Notes (Signed)
  Subjective:   Patient ID: Rhonda Dixon    DOB: 2001-09-03, 18 y.o. female   MRN: [id number]  Rhonda Dixon is a 18 y.o. female with a history of external hemorrhoid, GERD, allergic rhinitis, noctural enuresis, GAD, MDD here for   Abnormal menses Patient is concerned about possible pregnancy as her last period had a lighter flow than usual. She did not miss her period. LMP 12/2 with last intercourse 11/28. She reports some nausea and vomited a few times and has noticed her breasts have been tender. Her menses are typically 3 days long with heavy flow and has periods every month. Denies abnormal vaginal discharge, dyspareunia, intermenstrual bleeding. She is currently not on birth control and is not currently interested in starting. Occasionally uses condoms. She has tried Plan B before. She is in a monogamous relationship and has no concerns for STIs.  Review of Systems:  Per HPI.  Medications and smoking status reviewed.  Objective:   BP 120/74   Pulse 88   Wt 158 lb (71.7 kg)   LMP 08/13/2019 (Approximate)   BMI 27.12 kg/m  Vitals and nursing note reviewed.  General: well nourished, well developed, in no acute distress with non-toxic appearance CV: regular rate  Lungs: normal work of breathing Skin: warm, dry, no rashes or lesions Extremities: warm and well perfused, normal tone MSK: ROM grossly intact, gait normal Neuro: Alert and oriented, speech normal  Assessment & Plan:   Abnormal menses Reports lighter flow than usual and is concerned about possible pregnancy given she is not on birth control. UPreg negative today. Tender breasts and slight nausea may be related to hormonal fluctuation versus exacerbation of anxiety. Extensive discussion was had regarding birth control and safe sex practices, handout provided. Patient recently tested for STIs a few weeks ago and with lack of symptoms or new partners, no indication for testing today. Would recommend follow-up if her  symptoms persist, may require adjustment of dosing of Lexapro.  Orders Placed This Encounter  Procedures  . POCT urine pregnancy   No orders of the defined types were placed in this encounter.   Rory Percy, DO PGY-3, [facility] 08/20/2019 4:27 PM

## 2019-08-20 NOTE — Assessment & Plan Note (Addendum)
Reports lighter flow than usual and is concerned about possible pregnancy given she is not on birth control. UPreg negative today. Tender breasts and slight nausea may be related to hormonal fluctuation versus exacerbation of anxiety. Extensive discussion was had regarding birth control and safe sex practices, handout provided. Patient recently tested for STIs a few weeks ago and with lack of symptoms or new partners, no indication for testing today. Would recommend follow-up if her symptoms persist, may require adjustment of dosing of Lexapro.

## 2019-09-18 ENCOUNTER — Ambulatory Visit: Payer: Medicaid Other | Admitting: Family Medicine

## 2019-09-18 DIAGNOSIS — Z20828 Contact with and (suspected) exposure to other viral communicable diseases: Secondary | ICD-10-CM | POA: Diagnosis not present

## 2019-09-19 ENCOUNTER — Ambulatory Visit: Payer: Medicaid Other

## 2019-09-26 ENCOUNTER — Ambulatory Visit: Payer: Medicaid Other | Attending: Internal Medicine

## 2019-09-26 DIAGNOSIS — Z20822 Contact with and (suspected) exposure to covid-19: Secondary | ICD-10-CM | POA: Diagnosis not present

## 2019-09-27 LAB — NOVEL CORONAVIRUS, NAA: SARS-CoV-2, NAA: NOT DETECTED

## 2019-12-11 ENCOUNTER — Ambulatory Visit: Payer: Medicaid Other | Admitting: Family Medicine

## 2019-12-11 NOTE — Progress Notes (Deleted)
    SUBJECTIVE:   CHIEF COMPLAINT / HPI:   GAD escitalopram 10  PERTINENT  PMH / PSH: ***  OBJECTIVE:   There were no vitals taken for this visit.  ***  ASSESSMENT/PLAN:   No problem-specific Assessment & Plan notes found for this encounter.     Mirian Mo, MD [facility]

## 2019-12-18 ENCOUNTER — Ambulatory Visit: Payer: Medicaid Other | Admitting: Family Medicine

## 2019-12-18 ENCOUNTER — Ambulatory Visit (INDEPENDENT_AMBULATORY_CARE_PROVIDER_SITE_OTHER): Payer: Medicaid Other | Admitting: Family Medicine

## 2019-12-18 ENCOUNTER — Other Ambulatory Visit: Payer: Self-pay

## 2019-12-18 ENCOUNTER — Other Ambulatory Visit (HOSPITAL_COMMUNITY)
Admission: RE | Admit: 2019-12-18 | Discharge: 2019-12-18 | Disposition: A | Discharge status: Home / Self Care | Payer: Medicaid Other | Source: Ambulatory Visit | Attending: Family Medicine | Admitting: Family Medicine

## 2019-12-18 VITALS — BP 94/68 | HR 74 | Ht 64.0 in | Wt 158.4 lb

## 2019-12-18 DIAGNOSIS — Z113 Encounter for screening for infections with a predominantly sexual mode of transmission: Secondary | ICD-10-CM

## 2019-12-18 DIAGNOSIS — N898 Other specified noninflammatory disorders of vagina: Secondary | ICD-10-CM

## 2019-12-18 DIAGNOSIS — F411 Generalized anxiety disorder: Secondary | ICD-10-CM

## 2019-12-18 LAB — POCT WET PREP (WET MOUNT)
Clue Cells Wet Prep Whiff POC: POSITIVE
Trichomonas Wet Prep HPF POC: ABSENT

## 2019-12-18 LAB — POCT URINE PREGNANCY: Preg Test, Ur: NEGATIVE

## 2019-12-18 MED ORDER — FLUOXETINE HCL 20 MG PO TABS: 20.0000 mg | ORAL_TABLET | Freq: Every day | ORAL | 3 refills | Status: DC

## 2019-12-18 MED ORDER — METRONIDAZOLE 500 MG PO TABS: 500.0000 mg | ORAL_TABLET | Freq: Two times a day (BID) | ORAL | 0 refills | Status: DC

## 2019-12-18 NOTE — Progress Notes (Signed)
    SUBJECTIVE:   CHIEF COMPLAINT / HPI:   Vaginal discharge Rhonda Dixon reports that she has been having some white vaginal discharge for the past week or week and a half.  There is some malodor.  She denies fever, nausea, vomiting, pelvic pain.  She would like to be tested for STIs.  Anxiety/depression She reports that she has been struggling with significant symptoms of anxiety and depression since she is about 15.  She has previously been treated with Rhonda Dixon but she does not remember that was significantly helpful.  She is not currently taking Rhonda Dixon.  GAD-7 score today of 19, PHQ-9 score 20.  No SI/HI.  PERTINENT  PMH / PSH: Previously medicated with for anxiety with Rhonda Dixon.  OBJECTIVE:   BP 94/68   Pulse 74   Ht 5\' 4"  (1.626 m)   Wt 158 lb 6.4 oz (71.8 kg)   LMP 12/11/2019 (Approximate)   SpO2 100%   BMI 27.19 kg/m    General: Seated comfortably in the exam table.  No acute distress. Pelvis: Normal external female genitalia.  Healthy appearing vaginal mucosa.  Mild leukorrhea.  Samples taken.  No cervical motion tenderness.  Normal adnexa.  No enlarged uterus.   ASSESSMENT/PLAN:   Vaginal discharge Positive clue cells and whiff test.  Positive for bacterial vaginosis.  Pregnancy test negative.  Last day of most recent.  Was 3 days ago.  No intercourse since then. -Metronidazole 500 mg twice daily 7 days -Follow-up GC/chlamydia -Follow-up HIV, RPR  Generalized anxiety disorder She reports experiencing significant symptoms of both anxiety and depression.  She is currently more concerned about her depressive symptoms.  After a thorough discussion regarding SSRIs and adverse effects in teenagers, it was decided to start her on Rhonda Dixon (she did not find her previous treatment with Rhonda Dixon helpful).  She is planning to follow-up in 1 week with a virtual visit to assess her symptoms and ensure that she is not having worsened depression with suicidality.  She was also  encouraged to establish with a therapist.  She had not previously been seen by a therapist due to fear of a negative stigma. -Rhonda Dixon 20 mg daily -Video follow-up in 1 week -Plan for follow-up visit in 4 weeks -Provided list of Medicaid excepting therapists     12/13/2019, MD [facility]

## 2019-12-18 NOTE — Assessment & Plan Note (Signed)
She reports experiencing significant symptoms of both anxiety and depression.  She is currently more concerned about her depressive symptoms.  After a thorough discussion regarding SSRIs and adverse effects in teenagers, it was decided to start her on fluoxetine (she did not find her previous treatment with Lexapro helpful).  She is planning to follow-up in 1 week with a virtual visit to assess her symptoms and ensure that she is not having worsened depression with suicidality.  She was also encouraged to establish with a therapist.  She had not previously been seen by a therapist due to fear of a negative stigma. -Fluoxetine 20 mg daily -Video follow-up in 1 week -Plan for follow-up visit in 4 weeks -Provided list of Medicaid excepting therapists

## 2019-12-18 NOTE — Assessment & Plan Note (Addendum)
Positive clue cells and whiff test.  Positive for bacterial vaginosis.  Pregnancy test negative.  Last day of most recent.  Was 3 days ago.  No intercourse since then. -Metronidazole 500 mg twice daily 7 days -Follow-up GC/chlamydia -Follow-up HIV, RPR

## 2019-12-18 NOTE — Patient Instructions (Addendum)
Looks like your testing was remarkable for bacterial vaginosis.  This is a simple infection to treat.  I have prescribed you metronidazole to take twice daily for the next 7 days.  Anxiety/depression: Please see below for therapists in the area who accept Medicaid.  We are also going to start you on a medication called Prozac.  You can start taking this once a day.  Please schedule a telephone visit in 1 week.  This medication has a risk for increased suicidality in young adults.  I simply would like to touch base in 1 week to ensure that your symptoms are not worsening.  This medication typically takes 4-6 weeks to take full effect.  We will need another appointment 4 weeks from today to consider increasing her dose.   Therapy and Counseling Resources Most providers on this list will take Medicaid. Patients with commercial insurance or Medicare should contact their insurance company to get a list of in network providers.  [facility]  [address]   [place], [place] [address]      [phone]  [facility] [address]  [place], [place] [address]       [phone]     [facility] [address], [place], [place]  [phone]    [facility] [address] [place], [place] [phone]  [facility]:  [address] [place] [phone]  [facility]:  [address] [place], [place] [phone]  [facility] (under & uninsured) [address]   [place] [phone]    [email or web address]    [facility] [place] -[address]     Phone:  [phone]     [facility]-  [address]  [phone]   [facility] [address]      [place], [place] [phone]  [facility]: [address] [place], [place], [place]  [phone]   [facility]  (Spanish therapist) [address] [place]  [address]   [place] [address]    [phone]    [facility]   [address],  [place], [place]  [phone]   [facility]  [address] [place]  [phone]  [facility]  [address] [place], [place]        [phone]  [facility] under & uninsured [place], To schedule an appointment call [phone]- [phone] [address], [place] [phone]):  [facility] [place] - Fri from 8 AM - 3 PM  [facility] [address],  (Spanish)   [address], [place]: [phone]) 8:30 - 12; 1 - 2:30  [facility] [employer],  [address] [place], [place]    [phone]):8:30 - 12; 2 - 3PM  [facility],  [address]  [place]; [phone]):   Mon - Fri 8 AM - 5 PM  [facility] [address] [place]  MWF 12:30 to 3:00 or call to schedule an appointment  [phone]  Specific Provider options Psychology Today  [email or web address]/us 1. click on find a therapist  2. enter your zip code 3. left side and select or tailor a therapist for your specific need.   [facility] Provider Directory [email or web address]  (Medicaid)   Follow all drop down to find a provider  [place] [phone] or [email or web address] [address], [place], [place] Recovery support and educational   In home counseling [place] Telephone: [phone]  office in [place] [email or web address]   Does not take reg. Medicaid or Medicare private insurance [facility], [place] health Choice, [facility], [place], [place], [place], [place] Health Choice  24- Hour Availability:  . [place] or [phone]  . [facility] 11-10-2003 [phone]  [facility] Crisis Service  [phone]    . [place]  [phone] (after hours)  . Therapeutic Alternative/Mobile Crisis   [phone]  . [phone] National Suicide Hotline  [phone] ([place])  . Call 911 or go to emergency room  . [address]  [phone]);  [place] and [phone]   . [facility]  [phone]); [place], [place], [place], [place], [place], [place], [place]

## 2019-12-19 LAB — CERVICOVAGINAL ANCILLARY ONLY
Chlamydia: NEGATIVE
Comment: NEGATIVE
Comment: NORMAL
Neisseria Gonorrhea: NEGATIVE

## 2020-01-07 ENCOUNTER — Ambulatory Visit: Payer: Medicaid Other

## 2020-01-09 ENCOUNTER — Encounter: Payer: Self-pay | Admitting: Family Medicine

## 2020-01-09 ENCOUNTER — Ambulatory Visit (INDEPENDENT_AMBULATORY_CARE_PROVIDER_SITE_OTHER): Payer: Medicaid Other | Admitting: Family Medicine

## 2020-01-09 ENCOUNTER — Other Ambulatory Visit: Payer: Self-pay

## 2020-01-09 VITALS — BP 102/70 | HR 85 | Ht 64.0 in | Wt 155.0 lb

## 2020-01-09 DIAGNOSIS — L608 Other nail disorders: Secondary | ICD-10-CM | POA: Insufficient documentation

## 2020-01-09 DIAGNOSIS — Z1331 Encounter for screening for depression: Secondary | ICD-10-CM | POA: Diagnosis not present

## 2020-01-09 NOTE — Progress Notes (Signed)
    SUBJECTIVE:   CHIEF COMPLAINT / HPI:   Bilateral great toe nail discoloration Patient recently had a manicure with acrylic nails.  After the nail has been on for a few days she developed toe pain in both great toes.  The pain resolved and a few days.  She then noticed some discoloration at the base of the cuticle as they grew out.  She is concerned about the discoloration.  She is curious if she can have the nails removed.  They are not causing her any pain right now.  She has not tried to remove that acrylic nails on her own.  Elevated PHQ-9 Patient screen positive on PHQ-9 today.  Negative for suicidal ideation. Depression screen [facility] 2/9 01/09/2020 12/18/2019 12/18/2019 08/20/2019 08/20/2019  Decreased Interest 2 2 0 0 0  Down, Depressed, Hopeless 2 3 2  0 0  PHQ - 2 Score 4 5 2  0 0  Altered sleeping 1 3 0 - -  Tired, decreased energy 1 3 2  - -  Change in appetite 2 3 - - -  Feeling bad or failure about yourself  2 2 - - -  Trouble concentrating 2 2 - - -  Moving slowly or fidgety/restless 2 2 - - -  Suicidal thoughts 0 0 - - -  PHQ-9 Score 14 20 4  - -  Difficult doing work/chores - - - - -     PERTINENT  PMH / PSH:  GAD  OBJECTIVE:   BP 102/70   Pulse 85   Ht 5\' 4"  (1.626 m)   Wt 155 lb (70.3 kg)   LMP 12/11/2019 (Approximate)   SpO2 99%   BMI 26.61 kg/m   General: No acute distress MSK: Patient with acrylic white nails on all 10 toes.  They are starting to grow out.  There is mild darkening at the base of the great toes bilaterally.  The right great to may be slightly elevated, but it is hard to tell given is covered with the acrylic nail, the great toes are nontender bilaterally, there is no erythema or purulent drainage  ASSESSMENT/PLAN:   Discolored nails Discolored nails likely from application of acrylic nail.  No signs or symptoms of infection.  Likely will improve with removal of acrylic nail and given time to grow back out.  Positive depression  screening Positive PHQ 9 without sign SI/HI.  Recommend follow-up with PCP     , MD [facility]

## 2020-01-09 NOTE — Assessment & Plan Note (Signed)
Discolored nails likely from application of acrylic nail.  No signs or symptoms of infection.  Likely will improve with removal of acrylic nail and given time to grow back out.

## 2020-01-09 NOTE — Assessment & Plan Note (Signed)
Positive PHQ 9 without sign SI/HI.  Recommend follow-up with PCP

## 2020-01-17 ENCOUNTER — Ambulatory Visit (HOSPITAL_COMMUNITY)
Admission: EM | Admit: 2020-01-17 | Discharge: 2020-01-17 | Disposition: A | Discharge status: Home / Self Care | Payer: Medicaid Other | Attending: Internal Medicine | Admitting: Internal Medicine

## 2020-01-17 ENCOUNTER — Other Ambulatory Visit: Payer: Self-pay

## 2020-01-17 DIAGNOSIS — Z20822 Contact with and (suspected) exposure to covid-19: Secondary | ICD-10-CM | POA: Diagnosis not present

## 2020-01-17 DIAGNOSIS — K2900 Acute gastritis without bleeding: Secondary | ICD-10-CM | POA: Diagnosis not present

## 2020-01-17 DIAGNOSIS — X58XXXA Exposure to other specified factors, initial encounter: Secondary | ICD-10-CM | POA: Insufficient documentation

## 2020-01-17 DIAGNOSIS — S43402A Unspecified sprain of left shoulder joint, initial encounter: Secondary | ICD-10-CM | POA: Diagnosis not present

## 2020-01-17 DIAGNOSIS — R1013 Epigastric pain: Secondary | ICD-10-CM | POA: Diagnosis present

## 2020-01-17 LAB — POCT URINALYSIS DIP (DEVICE)
Glucose, UA: NEGATIVE mg/dL
Hgb urine dipstick: NEGATIVE
Ketones, ur: 40 mg/dL — AB
Leukocytes,Ua: NEGATIVE
Nitrite: NEGATIVE
Protein, ur: 30 mg/dL — AB
Specific Gravity, Urine: 1.025 (ref 1.005–1.030)
Urobilinogen, UA: 0.2 mg/dL (ref 0.0–1.0)
pH: 7 (ref 5.0–8.0)

## 2020-01-17 LAB — POC URINE PREG, ED: Preg Test, Ur: NEGATIVE

## 2020-01-17 MED ORDER — ALUM & MAG HYDROXIDE-SIMETH 200-200-20 MG/5ML PO SUSP
ORAL | Status: AC
  Filled 2020-01-17: qty 30

## 2020-01-17 MED ORDER — PANTOPRAZOLE SODIUM 20 MG PO TBEC: 20.0000 mg | DELAYED_RELEASE_TABLET | Freq: Every day | ORAL | 0 refills | Status: DC

## 2020-01-17 MED ORDER — LIDOCAINE VISCOUS HCL 2 % MT SOLN
15.0000 mL | Freq: Once | OROMUCOSAL | Status: AC
  Administered 2020-01-17: 12:00:00 15 mL via ORAL

## 2020-01-17 MED ORDER — ONDANSETRON 4 MG PO TBDP
4.0000 mg | ORAL_TABLET | Freq: Once | ORAL | Status: AC
  Administered 2020-01-17: 12:00:00 4 mg via ORAL

## 2020-01-17 MED ORDER — ONDANSETRON 4 MG PO TBDP
ORAL_TABLET | ORAL | Status: AC
  Filled 2020-01-17: qty 1

## 2020-01-17 MED ORDER — ALUM & MAG HYDROXIDE-SIMETH 200-200-20 MG/5ML PO SUSP
30.0000 mL | Freq: Once | ORAL | Status: AC
  Administered 2020-01-17: 30 mL via ORAL

## 2020-01-17 MED ORDER — ACETAMINOPHEN 500 MG PO TABS: 500.0000 mg | ORAL_TABLET | Freq: Four times a day (QID) | ORAL | 0 refills | Status: DC | PRN

## 2020-01-17 MED ORDER — ONDANSETRON 4 MG PO TBDP: 4.0000 mg | ORAL_TABLET | Freq: Three times a day (TID) | ORAL | 0 refills | Status: DC | PRN

## 2020-01-17 MED ORDER — LIDOCAINE VISCOUS HCL 2 % MT SOLN
OROMUCOSAL | Status: AC
  Filled 2020-01-17: qty 15

## 2020-01-17 NOTE — ED Triage Notes (Signed)
Pt c/o acute onset generalized abdom pain, n/v last night, HA today with general malaise/weakness. Denies sore throat, congestion, or cough.   Also c/o left shoulder pain; denies injury to area.  Denies dysuria sx.

## 2020-01-17 NOTE — ED Provider Notes (Addendum)
[facility]    CSN: [id number] Arrival date & time: 01/17/20  1106      History   Chief Complaint Chief Complaint  Patient presents with  . Abdominal Pain  . Emesis    HPI Rhonda Dixon is a 19 y.o. female comes to urgent care with complaints of abdominal pain started yesterday.  Abdominal pain is mainly in the epigastric and periumbilical region.  She took some Advil with no improvement.  No known aggravating factors.  Pain is sharp and throbbing.  She has had some vomiting that is nonbilious nonbloody.  No diarrhea.  No dizziness, near syncope or syncopal episode.  It is associated with a headache as well as generalized malaise and weakness.  She denies any sore throat, nasal congestion or cough.   Patient also complains of left shoulder pain.  No fall.  No shoulder dislocation.  No heavy lifting.  HPI  No past medical history on file.  Patient Active Problem List   Diagnosis Date Noted  . Discolored nails 01/09/2020  . Positive depression screening 01/09/2020  . Abnormal menses 08/20/2019  . GERD (gastroesophageal reflux disease) 07/30/2019  . Generalized anxiety disorder 10/23/2018  . Current severe episode of major depressive disorder without psychotic features without prior episode (HCC) 10/23/2018  . Hematuria 08/29/2018  . External hemorrhoid 08/22/2018  . Vaginal discharge 07/12/2017  . 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 ALLERGIC RHINITIS, SEASONAL 04/01/2009    No past surgical history on file.  OB History   No obstetric history on file.      Home Medications    Prior to Admission medications   Medication Sig Start Date End Date Taking? Authorizing Provider  acetaminophen (TYLENOL) 500 MG tablet Take 1 tablet (500 mg total) by mouth every 6 (six) hours as needed. 01/17/20   Abi Shoults, Britta Mccreedy, MD  cetirizine (ZYRTEC) 10 MG tablet Take 1 tablet (10 mg total) by mouth daily. 11/22/18   Howard Pouch, MD  metroNIDAZOLE (FLAGYL) 500 MG tablet Take 1  tablet (500 mg total) by mouth 2 (two) times daily. 12/18/19   Mirian Mo, MD  ondansetron (ZOFRAN ODT) 4 MG disintegrating tablet Take 1 tablet (4 mg total) by mouth every 8 (eight) hours as needed for nausea or vomiting. 01/17/20   Deshon Hsiao, Britta Mccreedy, MD  pantoprazole (PROTONIX) 20 MG tablet Take 1 tablet (20 mg total) by mouth daily. 01/17/20   Merrilee Jansky, MD  promethazine-dextromethorphan (PROMETHAZINE-DM) 6.25-15 MG/5ML syrup Take 5 mLs by mouth 4 (four) times daily as needed for cough. 06/26/19   Eustace Moore, MD  escitalopram (LEXAPRO) 10 MG tablet Take 0.5 tablets (5 mg total) by mouth daily. 10/23/18 01/17/20  Lennox Solders, MD  FLUoxetine (PROZAC) 20 MG tablet Take 1 tablet (20 mg total) by mouth daily. 12/18/19 01/17/20  Mirian Mo, MD  ipratropium (ATROVENT) 0.06 % nasal spray Place 2 sprays into both nostrils 4 (four) times daily. 07/26/18 01/17/20  Belinda Fisher, PA-C  omeprazole (PRILOSEC) 20 MG capsule Take 1 capsule (20 mg total) by mouth daily. 08/29/18 06/26/19  Tillman Sers, DO    Family History Family History  Problem Relation Age of Onset  . Lupus Mother     Social History Social History   Tobacco Use  . Smoking status: Never Smoker  . Smokeless tobacco: Never Used  Substance Use Topics  . Alcohol use: No  . Drug use: No     Allergies   Patient has no known allergies.  Review of Systems Review of Systems  Constitutional: Positive for activity change and fatigue. Negative for chills and fever.  HENT: Negative.   Respiratory: Negative for chest tightness and shortness of breath.   Gastrointestinal: Positive for diarrhea, nausea and vomiting. Negative for constipation.  Genitourinary: Negative.   Musculoskeletal: Negative.   Neurological: Positive for headaches. Negative for seizures and numbness.     Physical Exam Triage Vital Signs ED Triage Vitals  Enc Vitals Group     BP 01/17/20 1125 101/63     Pulse Rate 01/17/20 1125 (!) 105     Resp  01/17/20 1125 16     Temp 01/17/20 1125 97.8 F (36.6 C)     Temp src --      SpO2 01/17/20 1125 100 %     Weight --      Height --      Head Circumference --      Peak Flow --      Pain Score 01/17/20 1123 8     Pain Loc --      Pain Edu? --      Excl. in [place]? --    No data found.  Updated Vital Signs BP 101/63 (BP Location: Left Arm)   Pulse (!) 105   Temp 97.8 F (36.6 C)   Resp 16   LMP 12/27/2019 (Approximate)   SpO2 100%   Visual Acuity Right Eye Distance:   Left Eye Distance:   Bilateral Distance:    Right Eye Near:   Left Eye Near:    Bilateral Near:     Physical Exam   UC Treatments / Results  Labs (all labs ordered are listed, but only abnormal results are displayed) Labs Reviewed  POCT URINALYSIS DIP (DEVICE) - Abnormal; Notable for the following components:      Result Value   Bilirubin Urine SMALL (*)    Ketones, ur 40 (*)    Protein, ur 30 (*)    All other components within normal limits  SARS CORONAVIRUS 2 (TAT 6-24 HRS)  POC URINE PREG, ED    EKG   Radiology No results found.  Procedures Procedures (including critical care time)  Medications Ordered in UC Medications  ondansetron (ZOFRAN-ODT) disintegrating tablet 4 mg (4 mg Oral Given 01/17/20 1134)  alum & mag hydroxide-simeth (MAALOX/MYLANTA) 200-200-20 MG/5ML suspension 30 mL (30 mLs Oral Given 01/17/20 1217)    And  lidocaine (XYLOCAINE) 2 % viscous mouth solution 15 mL (15 mLs Oral Given 01/17/20 1217)    Initial Impression / Assessment and Plan / UC Course  I have reviewed the triage vital signs and the nursing notes.  Pertinent labs & imaging results that were available during my care of the patient were reviewed by me and considered in my medical decision making (see chart for details).     1.  Acute gastritis without hemorrhage: GI cocktail improved with the symptoms Protonix 20 mg orally daily Zofran 4 mg every 8 hours as needed for nausea/vomiting Tylenol as needed  for abdominal pain Return precautions given  2.  Left shoulder sprain: Gentle range of motion exercises Tylenol as needed for pain If no improvement, patient is advised to follow-up with orthopedic surgery for further evaluation.  Final Clinical Impressions(s) / UC Diagnoses   Final diagnoses:  Acute superficial gastritis without hemorrhage  Sprain of left shoulder, unspecified shoulder sprain type, initial encounter   Discharge Instructions   None    ED Prescriptions    Medication Sig  Dispense Auth. Provider   pantoprazole (PROTONIX) 20 MG tablet Take 1 tablet (20 mg total) by mouth daily. 30 tablet Anneli Bing, Britta Mccreedy, MD   ondansetron (ZOFRAN ODT) 4 MG disintegrating tablet Take 1 tablet (4 mg total) by mouth every 8 (eight) hours as needed for nausea or vomiting. 20 tablet Gregor Dershem, Britta Mccreedy, MD   acetaminophen (TYLENOL) 500 MG tablet Take 1 tablet (500 mg total) by mouth every 6 (six) hours as needed. 30 tablet Jaelen Soth, Britta Mccreedy, MD     PDMP not reviewed this encounter.   Merrilee Jansky, MD 01/17/20 1446    Merrilee Jansky, MD 01/17/20 1447

## 2020-01-18 LAB — SARS CORONAVIRUS 2 (TAT 6-24 HRS): SARS Coronavirus 2: NEGATIVE

## 2020-02-08 ENCOUNTER — Ambulatory Visit (HOSPITAL_COMMUNITY)
Admission: EM | Admit: 2020-02-08 | Discharge: 2020-02-08 | Disposition: A | Discharge status: Home / Self Care | Payer: Medicaid Other | Attending: Family Medicine | Admitting: Family Medicine

## 2020-02-08 ENCOUNTER — Encounter (HOSPITAL_COMMUNITY): Payer: Self-pay

## 2020-02-08 ENCOUNTER — Other Ambulatory Visit: Payer: Self-pay

## 2020-02-08 DIAGNOSIS — R110 Nausea: Secondary | ICD-10-CM | POA: Diagnosis not present

## 2020-02-08 DIAGNOSIS — R109 Unspecified abdominal pain: Secondary | ICD-10-CM | POA: Diagnosis not present

## 2020-02-08 DIAGNOSIS — R1010 Upper abdominal pain, unspecified: Secondary | ICD-10-CM | POA: Insufficient documentation

## 2020-02-08 DIAGNOSIS — Z3202 Encounter for pregnancy test, result negative: Secondary | ICD-10-CM

## 2020-02-08 LAB — BASIC METABOLIC PANEL
Anion gap: 8 (ref 5–15)
BUN: 8 mg/dL (ref 6–20)
CO2: 25 mmol/L (ref 22–32)
Calcium: 9.3 mg/dL (ref 8.9–10.3)
Chloride: 106 mmol/L (ref 98–111)
Creatinine, Ser: 1.08 mg/dL — ABNORMAL HIGH (ref 0.44–1.00)
GFR calc Af Amer: >60 mL/min (ref >60)
GFR calc non Af Amer: >60 mL/min (ref >60)
Glucose, Bld: 78 mg/dL (ref 70–99)
Potassium: 4.5 mmol/L (ref 3.5–5.1)
Sodium: 139 mmol/L (ref 135–145)

## 2020-02-08 LAB — CBC WITH DIFFERENTIAL/PLATELET
Abs Immature Granulocytes: 0.01 10*3/uL (ref 0.00–0.07)
Basophils Absolute: 0.1 10*3/uL (ref 0.0–0.1)
Basophils Relative: 1 %
Eosinophils Absolute: 0.1 10*3/uL (ref 0.0–0.5)
Eosinophils Relative: 3 %
HCT: 41.8 % (ref 36.0–46.0)
Hemoglobin: 13.7 g/dL (ref 12.0–15.0)
Immature Granulocytes: 0 %
Lymphocytes Relative: 40 %
Lymphs Abs: 1.9 10*3/uL (ref 0.7–4.0)
MCH: 30.6 pg (ref 26.0–34.0)
MCHC: 32.8 g/dL (ref 30.0–36.0)
MCV: 93.5 fL (ref 80.0–100.0)
Monocytes Absolute: 0.7 10*3/uL (ref 0.1–1.0)
Monocytes Relative: 15 %
Neutro Abs: 2 10*3/uL (ref 1.7–7.7)
Neutrophils Relative %: 41 %
Platelets: 359 10*3/uL (ref 150–400)
RBC: 4.47 MIL/uL (ref 3.87–5.11)
RDW: 13 % (ref 11.5–15.5)
WBC: 4.8 10*3/uL (ref 4.0–10.5)
nRBC: 0 % (ref 0.0–0.2)

## 2020-02-08 LAB — POCT URINALYSIS DIP (DEVICE)
Bilirubin Urine: NEGATIVE
Glucose, UA: NEGATIVE mg/dL
Hgb urine dipstick: NEGATIVE
Ketones, ur: NEGATIVE mg/dL
Leukocytes,Ua: NEGATIVE
Nitrite: NEGATIVE
Protein, ur: NEGATIVE mg/dL
Specific Gravity, Urine: >=1.03 (ref 1.005–1.030)
Urobilinogen, UA: 2 mg/dL — ABNORMAL HIGH (ref 0.0–1.0)
pH: 6.5 (ref 5.0–8.0)

## 2020-02-08 LAB — POC URINE PREG, ED: Preg Test, Ur: NEGATIVE

## 2020-02-08 NOTE — ED Triage Notes (Signed)
Pt c/o intermittent RUQ, LUQ painx3 days. Pt states the pain feels like the acid in her stomach is "messing" with her stomach. Pt c/o nausea. Pt denies V/D or constipation. Pt last BM was today. Pt states her stomach does not hurt at this time.

## 2020-02-08 NOTE — Discharge Instructions (Addendum)
Urobilinogen was in your urine  We will go forward with a gallbladder friendly diet.   I have included information for the stomach doctor for you to call as well  We will call you with abnormal test results.

## 2020-02-10 LAB — URINE CULTURE: Culture: >=100000 — AB

## 2020-02-12 NOTE — ED Provider Notes (Signed)
[facility]   [id number] 02/08/20 Arrival Time: 1823  CC: ABDOMINAL PAIN  SUBJECTIVE:  Rhonda Dixon is a 19 y.o. female who presents with complaint of abdominal discomfort that began gradually 3 days ago.  Denies a precipitating event, trauma, close contacts with similar symptoms, recent travel or antibiotic use. Localizes pain to epigastrum. Describes as intermittent and burning in character. Has not tried OTC medications. Denies alleviating or aggravating factors. Denies similar symptoms in the past.    Denies fever, chills, appetite changes, weight changes, nausea, vomiting, chest pain, SOB, diarrhea, constipation, hematochezia, melena, dysuria, difficulty urinating, increased frequency or urgency, flank pain, loss of bowel or bladder function, vaginal discharge, vaginal odor, vaginal bleeding, dyspareunia, pelvic pain.     ROS: As per HPI.  All other pertinent ROS negative.     History reviewed. No pertinent past medical history. History reviewed. No pertinent surgical history. No Known Allergies No current facility-administered medications on file prior to encounter.   Current Outpatient Medications on File Prior to Encounter  Medication Sig Dispense Refill  . acetaminophen (TYLENOL) 500 MG tablet Take 1 tablet (500 mg total) by mouth every 6 (six) hours as needed. 30 tablet 0  . cetirizine (ZYRTEC) 10 MG tablet Take 1 tablet (10 mg total) by mouth daily. 30 tablet 11  . metroNIDAZOLE (FLAGYL) 500 MG tablet Take 1 tablet (500 mg total) by mouth 2 (two) times daily. 14 tablet 0  . ondansetron (ZOFRAN ODT) 4 MG disintegrating tablet Take 1 tablet (4 mg total) by mouth every 8 (eight) hours as needed for nausea or vomiting. 20 tablet 0  . pantoprazole (PROTONIX) 20 MG tablet Take 1 tablet (20 mg total) by mouth daily. 30 tablet 0  . promethazine-dextromethorphan (PROMETHAZINE-DM) 6.25-15 MG/5ML syrup Take 5 mLs by mouth 4 (four) times daily as needed for cough. 118 mL 0    . [DISCONTINUED] escitalopram (LEXAPRO) 10 MG tablet Take 0.5 tablets (5 mg total) by mouth daily. 30 tablet 1  . [DISCONTINUED] FLUoxetine (PROZAC) 20 MG tablet Take 1 tablet (20 mg total) by mouth daily. 30 tablet 3  . [DISCONTINUED] ipratropium (ATROVENT) 0.06 % nasal spray Place 2 sprays into both nostrils 4 (four) times daily. 15 mL 0  . [DISCONTINUED] omeprazole (PRILOSEC) 20 MG capsule Take 1 capsule (20 mg total) by mouth daily. 14 capsule 0   Social History   Socioeconomic History  . Marital status: Single    Spouse name: Not on file  . Number of children: Not on file  . Years of education: Not on file  . Highest education level: Not on file  Occupational History  . Not on file  Tobacco Use  . Smoking status: Never Smoker  . Smokeless tobacco: Never Used  Substance and Sexual Activity  . Alcohol use: Yes    Comment: occ  . Drug use: No  . Sexual activity: Not Currently  Other Topics Concern  . Not on file  Social History Narrative  . Not on file   Social Determinants of Health   Financial Resource Strain:   . Difficulty of Paying Living Expenses:   Food Insecurity:   . Worried About Programme researcher, broadcasting/film/video in the Last Year:   . Barista in the Last Year:   Transportation Needs:   . Freight forwarder (Medical):   Marland Kitchen Lack of Transportation (Non-Medical):   Physical Activity:   . Days of Exercise per Week:   . Minutes of Exercise per Session:  Stress:   . Feeling of Stress :   Social Connections:   . Frequency of Communication with Friends and Family:   . Frequency of Social Gatherings with Friends and Family:   . Attends Religious Services:   . Active Member of Clubs or Organizations:   . Attends Archivist Meetings:   Marland Kitchen Marital Status:   Intimate Partner Violence:   . Fear of Current or Ex-Partner:   . Emotionally Abused:   Marland Kitchen Physically Abused:   . Sexually Abused:    Family History  Problem Relation Age of Onset  . Lupus Mother       OBJECTIVE:  Vitals:   02/08/20 1941  BP: (!) 112/58  Pulse: 64  Resp: 16  Temp: 99 F (37.2 C)  TempSrc: Oral  SpO2: 100%  Weight: 150 lb (68 kg)  Height: 5\' 4"  (1.626 m)    General appearance: Alert; NAD HEENT: NCAT.  Oropharynx clear.  Lungs: clear to auscultation bilaterally without adventitious breath sounds Heart: regular rate and rhythm.  Radial pulses 2+ symmetrical bilaterally Abdomen: soft, non-distended; normal active bowel sounds; non-tender to light and deep palpation; nontender at McBurney's point; negative Murphy's sign; negative rebound; no guarding Back: no CVA tenderness Extremities: no edema; symmetrical with no gross deformities Skin: warm and dry Neurologic: normal gait Psychological: alert and cooperative; normal mood and affect  LABS: No results found for this or any previous visit (from the past 24 hour(s)).  DIAGNOSTIC STUDIES: No results found.   ASSESSMENT & PLAN:  1. Pain of upper abdomen   2. Nausea     No orders of the defined types were placed in this encounter.  Abd pain Nausea Get rest and drink fluids Take home Zofran as needed for nausea.  DIET Instructions:  30 minutes after taking nausea medicine, begin with sips of clear liquids. If able to hold down 2 - 4 ounces for 30 minutes, begin drinking more. Increase your fluid intake to replace losses. Clear liquids only for 24 hours (water, tea, sport drinks, clear flat ginger ale or cola and juices, broth, jello, popsicles, ect). Advance to bland foods, applesauce, rice, baked or boiled chicken, ect. Avoid milk, greasy foods and anything that doesn't agree with you. Included gallbladder friendly diet  If you experience new or worsening symptoms return or go to ER such as fever, chills, nausea, vomiting, diarrhea, bloody or dark tarry stools, constipation, urinary symptoms, worsening abdominal discomfort, symptoms that do not improve with medications, inability to keep fluids  down.  Reviewed expectations re: course of current medical issues. Questions answered. Outlined signs and symptoms indicating need for more acute intervention. Patient verbalized understanding. After Visit Summary given.    Faustino Congress, NP 02/12/20 1010

## 2020-04-02 ENCOUNTER — Ambulatory Visit: Payer: Medicaid Other | Admitting: Family Medicine

## 2020-04-10 ENCOUNTER — Ambulatory Visit (INDEPENDENT_AMBULATORY_CARE_PROVIDER_SITE_OTHER): Payer: Medicaid Other | Admitting: Family Medicine

## 2020-04-10 ENCOUNTER — Other Ambulatory Visit: Payer: Self-pay

## 2020-04-10 ENCOUNTER — Encounter: Payer: Self-pay | Admitting: Family Medicine

## 2020-04-10 ENCOUNTER — Other Ambulatory Visit (HOSPITAL_COMMUNITY)
Admission: RE | Admit: 2020-04-10 | Discharge: 2020-04-10 | Disposition: A | Discharge status: Home / Self Care | Payer: Medicaid Other | Source: Ambulatory Visit | Attending: Family Medicine | Admitting: Family Medicine

## 2020-04-10 VITALS — BP 108/74 | HR 74 | Ht 64.0 in | Wt 157.2 lb

## 2020-04-10 DIAGNOSIS — N898 Other specified noninflammatory disorders of vagina: Secondary | ICD-10-CM | POA: Diagnosis not present

## 2020-04-10 DIAGNOSIS — Z114 Encounter for screening for human immunodeficiency virus [HIV]: Secondary | ICD-10-CM | POA: Diagnosis not present

## 2020-04-10 DIAGNOSIS — Z113 Encounter for screening for infections with a predominantly sexual mode of transmission: Secondary | ICD-10-CM | POA: Diagnosis not present

## 2020-04-10 DIAGNOSIS — B9689 Other specified bacterial agents as the cause of diseases classified elsewhere: Secondary | ICD-10-CM

## 2020-04-10 DIAGNOSIS — Z309 Encounter for contraceptive management, unspecified: Secondary | ICD-10-CM | POA: Insufficient documentation

## 2020-04-10 DIAGNOSIS — N760 Acute vaginitis: Secondary | ICD-10-CM | POA: Diagnosis not present

## 2020-04-10 LAB — POCT URINALYSIS DIP (MANUAL ENTRY)
Glucose, UA: NEGATIVE mg/dL
Ketones, POC UA: NEGATIVE mg/dL
Leukocytes, UA: NEGATIVE
Nitrite, UA: NEGATIVE
Spec Grav, UA: >=1.03 — AB (ref 1.010–1.025)
Urobilinogen, UA: 0.2 E.U./dL
pH, UA: 6 (ref 5.0–8.0)

## 2020-04-10 LAB — POCT WET PREP (WET MOUNT)
Clue Cells Wet Prep Whiff POC: POSITIVE
Trichomonas Wet Prep HPF POC: ABSENT

## 2020-04-10 MED ORDER — METRONIDAZOLE 500 MG PO TABS
500.0000 mg | ORAL_TABLET | Freq: Two times a day (BID) | ORAL | 0 refills | Status: DC
Start: 2020-04-10 — End: 2021-01-15

## 2020-04-10 NOTE — Assessment & Plan Note (Signed)
She was informed that having intercourse on a regular basis without any birth control creates a situation where she is very likely to become pregnant.  We discussed various methods of birth control.  She is not interested in initiating any birth control today.  She was strongly advised to consider birth control.

## 2020-04-10 NOTE — Progress Notes (Signed)
    SUBJECTIVE:   CHIEF COMPLAINT / HPI:   Bacterial vaginosis For the past week, she has noted very mild white vaginal discharge with malodor and vaginal itching.  She denies abdominal pain, fevers, stomach pain.  She is sexually active and would like to be tested for STIs.  She reports that her last period began 2 days ago.  Birth control She reports that she has been in a committed relationship for over a year and has been sexually active on a regular basis with her partner without any form of birth control.  She has not gotten pregnant yet.  She does not want to become pregnant at this time but she wants to know if she should be concerned that she has not yet conceived.  PERTINENT  PMH / PSH: Sexually active  OBJECTIVE:   BP 108/74   Pulse 74   Ht 5\' 4"  (1.626 m)   Wt 157 lb 3.2 oz (71.3 kg)   LMP 04/08/2020 (Exact Date)   SpO2 98%   BMI 26.98 kg/m    General: Well-appearing young woman.  No acute distress.  Lying comfortably on the exam table. Pelvic exam: Normal external genitalia.  Pink, moist vaginal mucosa.  Os visualized.  No obvious leukorrhea.  Samples taken.  Bimanual exam declined.  ASSESSMENT/PLAN:   Bacterial vaginosis History and wet prep consistent with bacterial vaginosis.  No pregnancy test performed because her period began 2 days ago. -Metronidazole 500 mg twice daily for 7 days -Follow-up GC/chlamydia -Follow-up HIV/RPR, hepatitis C  Contraception management She was informed that having intercourse on a regular basis without any birth control creates a situation where she is very likely to become pregnant.  We discussed various methods of birth control.  She is not interested in initiating any birth control today.  She was strongly advised to consider birth control.     04/10/2020, MD [facility]

## 2020-04-10 NOTE — Assessment & Plan Note (Signed)
History and wet prep consistent with bacterial vaginosis.  No pregnancy test performed because her period began 2 days ago. -Metronidazole 500 mg twice daily for 7 days -Follow-up GC/chlamydia -Follow-up HIV/RPR, hepatitis C

## 2020-04-10 NOTE — Patient Instructions (Signed)
Bacterial vaginosis: This is an overabundance of the normal vaginal flora.  This should improve without issue with oral antibiotics for 1 week.

## 2020-04-11 LAB — CERVICOVAGINAL ANCILLARY ONLY
Chlamydia: NEGATIVE
Comment: NEGATIVE
Comment: NORMAL
Neisseria Gonorrhea: NEGATIVE

## 2020-04-11 LAB — HIV ANTIBODY (ROUTINE TESTING W REFLEX): HIV Screen 4th Generation wRfx: NONREACTIVE

## 2020-04-11 LAB — HEPATITIS C ANTIBODY: Hep C Virus Ab: <0.1 s/co ratio (ref 0.0–0.9)

## 2020-04-11 LAB — RPR: RPR Ser Ql: NONREACTIVE

## 2020-06-06 ENCOUNTER — Other Ambulatory Visit: Payer: Medicaid Other

## 2020-06-28 ENCOUNTER — Other Ambulatory Visit: Payer: Self-pay

## 2020-06-28 ENCOUNTER — Encounter (HOSPITAL_COMMUNITY): Payer: Self-pay | Admitting: Emergency Medicine

## 2020-06-28 ENCOUNTER — Emergency Department (HOSPITAL_COMMUNITY): Payer: Medicaid Other

## 2020-06-28 ENCOUNTER — Emergency Department (HOSPITAL_COMMUNITY)
Admission: EM | Admit: 2020-06-28 | Discharge: 2020-06-28 | Disposition: A | Discharge status: Home / Self Care | Payer: Medicaid Other | Attending: Emergency Medicine | Admitting: Emergency Medicine

## 2020-06-28 DIAGNOSIS — M545 Low back pain, unspecified: Secondary | ICD-10-CM | POA: Diagnosis not present

## 2020-06-28 DIAGNOSIS — Z5321 Procedure and treatment not carried out due to patient leaving prior to being seen by health care provider: Secondary | ICD-10-CM | POA: Insufficient documentation

## 2020-06-28 DIAGNOSIS — R519 Headache, unspecified: Secondary | ICD-10-CM | POA: Diagnosis not present
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
## 2020-06-28 NOTE — ED Notes (Signed)
No answer for vitals recheck x1 

## 2020-06-29 ENCOUNTER — Other Ambulatory Visit: Payer: Self-pay

## 2020-06-29 ENCOUNTER — Emergency Department (HOSPITAL_COMMUNITY)
Admission: EM | Admit: 2020-06-29 | Discharge: 2020-06-29 | Disposition: A | Discharge status: Home / Self Care | Payer: Medicaid Other | Attending: Emergency Medicine | Admitting: Emergency Medicine

## 2020-06-29 ENCOUNTER — Encounter (HOSPITAL_COMMUNITY): Payer: Self-pay

## 2020-06-29 ENCOUNTER — Emergency Department (HOSPITAL_COMMUNITY): Payer: Medicaid Other

## 2020-06-29 DIAGNOSIS — M25512 Pain in left shoulder: Secondary | ICD-10-CM | POA: Diagnosis not present

## 2020-06-29 DIAGNOSIS — M5459 Other low back pain: Secondary | ICD-10-CM | POA: Insufficient documentation

## 2020-06-29 DIAGNOSIS — M545 Low back pain, unspecified: Secondary | ICD-10-CM | POA: Diagnosis not present

## 2020-06-29 DIAGNOSIS — M549 Dorsalgia, unspecified: Secondary | ICD-10-CM | POA: Diagnosis present

## 2020-06-29 MED ORDER — CYCLOBENZAPRINE HCL 10 MG PO TABS: 10.0000 mg | ORAL_TABLET | Freq: Two times a day (BID) | ORAL | 0 refills | Status: DC | PRN

## 2020-06-29 MED ORDER — IBUPROFEN 800 MG PO TABS
800.0000 mg | ORAL_TABLET | Freq: Once | ORAL | Status: AC
  Administered 2020-06-29: 800 mg via ORAL
  Filled 2020-06-29: qty 1
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
## 2020-06-30 ENCOUNTER — Telehealth: Payer: Self-pay | Admitting: *Deleted

## 2020-06-30 NOTE — Telephone Encounter (Signed)
Transition Care Management Unsuccessful Follow-up Telephone Call  Date of discharge and from where:  06/29/20 [facility]  Attempts:  1st Attempt  Reason for unsuccessful TCM follow-up call:  Left voice message

## 2020-07-01 ENCOUNTER — Telehealth: Payer: Self-pay | Admitting: *Deleted

## 2020-07-01 NOTE — Telephone Encounter (Signed)
Email sent to Family Medicine  to schedule follow up appointment .   Zakeria Kulzer    PEC [phone] 

## 2020-07-02 NOTE — Telephone Encounter (Signed)
Message will be closed as we are outside of the 48 hour window to contact patient.  

## 2020-09-24 NOTE — Progress Notes (Deleted)
    SUBJECTIVE:   CHIEF COMPLAINT / HPI:   ***  PERTINENT  PMH / PSH: ***  OBJECTIVE:   There were no vitals taken for this visit.  General: ***, NAD CV: RRR, no murmurs*** Pulm: CTAB, no wheezes or rales  ASSESSMENT/PLAN:   No problem-specific Assessment & Plan notes found for this encounter.     Rhonda Wendell, MD [facility]  

## 2020-09-24 NOTE — Patient Instructions (Incomplete)
It was nice seeing you today!    Stay well, Liley Rake, MD [facility] [phone]  

## 2020-09-25 ENCOUNTER — Ambulatory Visit: Payer: Medicaid Other | Admitting: Family Medicine

## 2020-09-26 ENCOUNTER — Ambulatory Visit (INDEPENDENT_AMBULATORY_CARE_PROVIDER_SITE_OTHER): Payer: Medicaid Other | Admitting: Family Medicine

## 2020-09-26 ENCOUNTER — Other Ambulatory Visit (HOSPITAL_COMMUNITY)
Admission: RE | Admit: 2020-09-26 | Discharge: 2020-09-26 | Disposition: A | Discharge status: Home / Self Care | Payer: Medicaid Other | Source: Ambulatory Visit | Attending: Family Medicine | Admitting: Family Medicine

## 2020-09-26 ENCOUNTER — Other Ambulatory Visit: Payer: Self-pay

## 2020-09-26 ENCOUNTER — Encounter: Payer: Self-pay | Admitting: Family Medicine

## 2020-09-26 VITALS — BP 112/70 | HR 79 | Ht 64.17 in | Wt 164.4 lb

## 2020-09-26 DIAGNOSIS — R300 Dysuria: Secondary | ICD-10-CM | POA: Diagnosis not present

## 2020-09-26 DIAGNOSIS — B373 Candidiasis of vulva and vagina: Secondary | ICD-10-CM

## 2020-09-26 DIAGNOSIS — N898 Other specified noninflammatory disorders of vagina: Secondary | ICD-10-CM | POA: Insufficient documentation

## 2020-09-26 DIAGNOSIS — Z113 Encounter for screening for infections with a predominantly sexual mode of transmission: Secondary | ICD-10-CM | POA: Diagnosis not present

## 2020-09-26 DIAGNOSIS — K1370 Unspecified lesions of oral mucosa: Secondary | ICD-10-CM

## 2020-09-26 DIAGNOSIS — B3731 Acute candidiasis of vulva and vagina: Secondary | ICD-10-CM

## 2020-09-26 LAB — POCT UA - MICROSCOPIC ONLY

## 2020-09-26 LAB — POCT URINALYSIS DIP (MANUAL ENTRY)
Bilirubin, UA: NEGATIVE
Glucose, UA: NEGATIVE mg/dL
Ketones, POC UA: NEGATIVE mg/dL
Leukocytes, UA: NEGATIVE
Nitrite, UA: NEGATIVE
Protein Ur, POC: NEGATIVE mg/dL
Spec Grav, UA: 1.025 (ref 1.010–1.025)
Urobilinogen, UA: 0.2 E.U./dL
pH, UA: 6.5 (ref 5.0–8.0)

## 2020-09-26 LAB — POCT WET PREP (WET MOUNT)
Clue Cells Wet Prep Whiff POC: NEGATIVE
Trichomonas Wet Prep HPF POC: ABSENT

## 2020-09-26 MED ORDER — FLUCONAZOLE 150 MG PO TABS: 150.0000 mg | ORAL_TABLET | Freq: Once | ORAL | 0 refills | Status: AC

## 2020-09-26 NOTE — Assessment & Plan Note (Signed)
Symptoms and pelvic exam congruent with vaginal candidiasis.  Wet prep confirmed.  Prescribed p.o. fluconazole 150 mg once.  Return precautions given.

## 2020-09-26 NOTE — Patient Instructions (Addendum)
It was wonderful to meet you today. Thank you for allowing me to be a part of your care. Below is a short summary of what we discussed at your visit today:  Yeast infection Today, we performed a pelvic exam and obtained a swab.  Your vaginal exam and discharge were congruent with a yeast infection.  We have collected swabs to confirm this. For normal results, we will send a MyChart message; for abnormal results, we will give you a call with the results and a plan going forward.  If you do in fact have a yeast infection, I will send the appropriate medication to your pharmacy.  STD screenings Today we obtained a vaginal swab to test for gonorrhea, chlamydia, and trichomonas. We also obtained a blood sample to test for HIV, herpes, and syphilis. For normal results, we will send a MyChart message; for abnormal results, we will give you a call with the results and a plan going forward.   Vaccines Today you declined the annual flu vaccine the COVID vaccine.  If you change your mind at any time, you may simply call the clinic and make a vaccine only appointment with a nurse at your convenience.  We recommend getting your flu shot every year.  While it does not fully prevent you from getting the flu or COVID, it does reduce symptom severity and keep you out of the hospital.    If you have any questions or concerns, please do not hesitate to contact us via phone or MyChart message.   Fayette Pho, MD

## 2020-09-26 NOTE — Assessment & Plan Note (Addendum)
Cervical vaginal swab and blood samples obtained.  Vaginal erythema and thick white discharge most likely due to vaginal candidiasis as noted above.  Low suspicion for STI.  Offered to discuss PrEP, patient declined citing low concern due to currently monogamous relationship.  Discussed with her that should she have additional sexual partners in the future, high risk sexual activity, or other concerns and would desire HIV prophylaxis, that she may obtain a clinic appointment anytime to discuss PrEP.

## 2020-09-26 NOTE — Addendum Note (Signed)
Addended by: Valetta Close on: 09/26/2020 05:48 PM   Modules accepted: Orders
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
## 2020-09-27 LAB — HIV ANTIBODY (ROUTINE TESTING W REFLEX): HIV Screen 4th Generation wRfx: NONREACTIVE

## 2020-09-27 LAB — RPR: RPR Ser Ql: NONREACTIVE

## 2020-09-27 LAB — HSV(HERPES SIMPLEX VRS) I + II AB-IGG
HSV 1 Glycoprotein G Ab, IgG: 44.3 index — ABNORMAL HIGH (ref 0.00–0.90)
HSV 2 IgG, Type Spec: <0.91 index (ref 0.00–0.90)

## 2020-09-29 LAB — CERVICOVAGINAL ANCILLARY ONLY
Bacterial Vaginitis (gardnerella): NEGATIVE
Candida Glabrata: NEGATIVE
Candida Vaginitis: POSITIVE — AB
Chlamydia: NEGATIVE
Comment: NEGATIVE
Comment: NEGATIVE
Comment: NEGATIVE
Comment: NEGATIVE
Comment: NEGATIVE
Comment: NORMAL
Neisseria Gonorrhea: NEGATIVE
Trichomonas: NEGATIVE

## 2020-09-30 ENCOUNTER — Encounter: Payer: Self-pay | Admitting: Family Medicine

## 2020-11-04 DIAGNOSIS — Z1152 Encounter for screening for COVID-19: Secondary | ICD-10-CM | POA: Diagnosis not present

## 2020-11-13 DIAGNOSIS — Z1152 Encounter for screening for COVID-19: Secondary | ICD-10-CM | POA: Diagnosis not present

## 2021-01-15 ENCOUNTER — Encounter (HOSPITAL_COMMUNITY): Payer: Self-pay

## 2021-01-15 ENCOUNTER — Other Ambulatory Visit: Payer: Self-pay

## 2021-01-15 ENCOUNTER — Ambulatory Visit (HOSPITAL_COMMUNITY)
Admission: EM | Admit: 2021-01-15 | Discharge: 2021-01-15 | Disposition: A | Discharge status: Home / Self Care | Payer: Medicaid Other | Attending: Internal Medicine | Admitting: Internal Medicine

## 2021-01-15 DIAGNOSIS — Z20822 Contact with and (suspected) exposure to covid-19: Secondary | ICD-10-CM | POA: Diagnosis not present

## 2021-01-15 DIAGNOSIS — Z79899 Other long term (current) drug therapy: Secondary | ICD-10-CM | POA: Insufficient documentation

## 2021-01-15 DIAGNOSIS — J029 Acute pharyngitis, unspecified: Secondary | ICD-10-CM | POA: Insufficient documentation

## 2021-01-15 DIAGNOSIS — R059 Cough, unspecified: Secondary | ICD-10-CM | POA: Diagnosis not present

## 2021-01-15 LAB — SARS CORONAVIRUS 2 (TAT 6-24 HRS): SARS Coronavirus 2: NEGATIVE

## 2021-01-15 MED ORDER — BENZONATATE 100 MG PO CAPS: 100.0000 mg | ORAL_CAPSULE | Freq: Three times a day (TID) | ORAL | 0 refills | Status: DC

## 2021-01-15 NOTE — Discharge Instructions (Addendum)
Warm salt water gargle Please quarantine until COVID-19 test results are available Tessalon Perles as needed for cough Throat lozenges will help your cough as well We will call you with recommendations if labs results are abnormal.

## 2021-01-15 NOTE — ED Triage Notes (Signed)
Pt c/o sore throat, cough X 3 days.

## 2021-01-18 NOTE — ED Provider Notes (Signed)
[facility]    CSN: [id number] Arrival date & time: 01/15/21  1110      History   Chief Complaint Chief Complaint  Patient presents with  . Cough  . Sore Throat    HPI Rhonda Dixon is a 20 y.o. female comes to the urgent care with a 3-day history of sore throat and cough.  Symptoms started insidiously and has been persistent.  She denies any shortness of breath or chest tightness.  Cough is nonproductive.  No fever or chills.  No sick contacts.  No abdominal pain, nausea or vomiting.   HPI  History reviewed. No pertinent past medical history.  Patient Active Problem List   Diagnosis Date Noted  . Vaginal candidiasis 09/26/2020  . Routine screening for STI (sexually transmitted infection) 09/26/2020  . Bacterial vaginosis 04/10/2020  . Contraception management 04/10/2020  . Discolored nails 01/09/2020  . Positive depression screening 01/09/2020  . Abnormal menses 08/20/2019  . GERD (gastroesophageal reflux disease) 07/30/2019  . Generalized anxiety disorder 10/23/2018  . Current severe episode of major depressive disorder without psychotic features without prior episode (HCC) 10/23/2018  . Hematuria 08/29/2018  . External hemorrhoid 08/22/2018  . Vaginal discharge 07/12/2017  . 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 ALLERGIC RHINITIS, SEASONAL 04/01/2009    History reviewed. No pertinent surgical history.  OB History   No obstetric history on file.      Home Medications    Prior to Admission medications   Medication Sig Start Date End Date Taking? Authorizing Provider  benzonatate (TESSALON) 100 MG capsule Take 1 capsule (100 mg total) by mouth every 8 (eight) hours. 01/15/21  Yes Zykeriah Mathia, Britta Mccreedy, MD  acetaminophen (TYLENOL) 500 MG tablet Take 1 tablet (500 mg total) by mouth every 6 (six) hours as needed. 01/17/20   Cruz Bong, Britta Mccreedy, MD  cetirizine (ZYRTEC) 10 MG tablet Take 1 tablet (10 mg total) by mouth daily. 11/22/18   Howard Pouch, MD   cyclobenzaprine (FLEXERIL) 10 MG tablet Take 1 tablet (10 mg total) by mouth 2 (two) times daily as needed for muscle spasms. 06/29/20   Farrel Gordon, PA-C  ondansetron (ZOFRAN ODT) 4 MG disintegrating tablet Take 1 tablet (4 mg total) by mouth every 8 (eight) hours as needed for nausea or vomiting. 01/17/20   Jodey Burbano, Britta Mccreedy, MD  pantoprazole (PROTONIX) 20 MG tablet Take 1 tablet (20 mg total) by mouth daily. 01/17/20   Merrilee Jansky, MD  promethazine-dextromethorphan (PROMETHAZINE-DM) 6.25-15 MG/5ML syrup Take 5 mLs by mouth 4 (four) times daily as needed for cough. 06/26/19   Eustace Moore, MD  escitalopram (LEXAPRO) 10 MG tablet Take 0.5 tablets (5 mg total) by mouth daily. 10/23/18 01/17/20  Lennox Solders, MD  FLUoxetine (PROZAC) 20 MG tablet Take 1 tablet (20 mg total) by mouth daily. 12/18/19 01/17/20  Mirian Mo, MD  ipratropium (ATROVENT) 0.06 % nasal spray Place 2 sprays into both nostrils 4 (four) times daily. 07/26/18 01/17/20  Belinda Fisher, PA-C  omeprazole (PRILOSEC) 20 MG capsule Take 1 capsule (20 mg total) by mouth daily. 08/29/18 06/26/19  Tillman Sers, DO    Family History Family History  Problem Relation Age of Onset  . Lupus Mother     Social History Social History   Tobacco Use  . Smoking status: Never Smoker  . Smokeless tobacco: Never Used  Substance Use Topics  . Alcohol use: Yes    Comment: occ  . Drug use: No  Allergies   Patient has no known allergies.   Review of Systems Review of Systems  Constitutional: Negative for chills and fever.  HENT: Positive for sore throat.   Respiratory: Positive for cough.   Musculoskeletal: Negative for arthralgias.  Neurological: Negative for headaches.     Physical Exam Triage Vital Signs ED Triage Vitals  Enc Vitals Group     BP 01/15/21 1226 120/80     Pulse Rate 01/15/21 1226 95     Resp 01/15/21 1226 20     Temp 01/15/21 1226 98.6 F (37 C)     Temp Source 01/15/21 1226 Oral     SpO2  01/15/21 1226 98 %     Weight --      Height --      Head Circumference --      Peak Flow --      Pain Score 01/15/21 1225 8     Pain Loc --      Pain Edu? --      Excl. in GC? --    No data found.  Updated Vital Signs BP 120/80 (BP Location: Right Arm)   Pulse 95   Temp 98.6 F (37 C) (Oral)   Resp 20   LMP 01/11/2021 (Exact Date)   SpO2 98%   Visual Acuity Right Eye Distance:   Left Eye Distance:   Bilateral Distance:    Right Eye Near:   Left Eye Near:    Bilateral Near:     Physical Exam Vitals and nursing note reviewed.  Constitutional:      General: She is not in acute distress.    Appearance: She is not ill-appearing.  HENT:     Right Ear: Tympanic membrane normal.     Left Ear: Tympanic membrane normal.     Nose: Congestion present.     Mouth/Throat:     Mouth: Mucous membranes are moist.     Pharynx: No posterior oropharyngeal erythema.  Cardiovascular:     Rate and Rhythm: Normal rate and regular rhythm.     Heart sounds: Normal heart sounds.  Pulmonary:     Effort: Pulmonary effort is normal.     Breath sounds: Normal breath sounds.  Neurological:     Mental Status: She is alert.      UC Treatments / Results  Labs (all labs ordered are listed, but only abnormal results are displayed) Labs Reviewed  SARS CORONAVIRUS 2 (TAT 6-24 HRS)    EKG   Radiology No results found.  Procedures Procedures (including critical care time)  Medications Ordered in UC Medications - No data to display  Initial Impression / Assessment and Plan / UC Course  I have reviewed the triage vital signs and the nursing notes.  Pertinent labs & imaging results that were available during my care of the patient were reviewed by me and considered in my medical decision making (see chart for details).     1.  Acute pharyngitis likely viral COVID-19 PCR test that was [employer] as needed for cough Warm salt water gargle We will call you with  recommendations if labs are abnormal Cepacol lozenges will help soothe your throat. Return precautions given Final Clinical Impressions(s) / UC Diagnoses   Final diagnoses:  Acute pharyngitis, unspecified etiology     Discharge Instructions     Warm salt water gargle Please quarantine until COVID-19 test results are available Tessalon Perles as needed for cough Throat lozenges will help your cough as well We  will call you with recommendations if labs results are abnormal.   ED Prescriptions    Medication Sig Dispense Auth. Provider   benzonatate (TESSALON) 100 MG capsule Take 1 capsule (100 mg total) by mouth every 8 (eight) hours. 21 capsule Arla Boutwell, Britta Mccreedy, MD     PDMP not reviewed this encounter.   Merrilee Jansky, MD 01/18/21 1120

## 2021-01-21 ENCOUNTER — Other Ambulatory Visit: Payer: Self-pay

## 2021-01-21 ENCOUNTER — Ambulatory Visit (HOSPITAL_COMMUNITY)
Admission: EM | Admit: 2021-01-21 | Discharge: 2021-01-21 | Disposition: A | Discharge status: Home / Self Care | Payer: Medicaid Other | Attending: Family Medicine | Admitting: Family Medicine

## 2021-01-21 ENCOUNTER — Encounter (HOSPITAL_COMMUNITY): Payer: Self-pay | Admitting: *Deleted

## 2021-01-21 DIAGNOSIS — N760 Acute vaginitis: Secondary | ICD-10-CM | POA: Diagnosis not present

## 2021-01-21 DIAGNOSIS — N898 Other specified noninflammatory disorders of vagina: Secondary | ICD-10-CM | POA: Diagnosis not present

## 2021-01-21 MED ORDER — NYSTATIN 100000 UNIT/GM EX CREA: TOPICAL_CREAM | CUTANEOUS | 0 refills | Status: DC

## 2021-01-21 MED ORDER — DOXYCYCLINE HYCLATE 100 MG PO TABS: 100.0000 mg | ORAL_TABLET | Freq: Two times a day (BID) | ORAL | 0 refills | Status: DC

## 2021-01-21 NOTE — ED Triage Notes (Signed)
Pt reports vag swelling and yeast infection over night.

## 2021-01-21 NOTE — ED Provider Notes (Signed)
[facility]    CSN: [id number] Arrival date & time: 01/21/21  1027      History   Chief Complaint Chief Complaint  Patient presents with  . Groin Swelling  . Vaginitis    HPI Rhonda Dixon is a 20 y.o. female.   Presenting today with partner for evaluation of right-sided labial pain and swelling that started yesterday after intercourse.  She denies any new products used or abnormalities to the interaction.  States this is never happened to her before.  Denies abnormal bleeding, but states the area is somewhat itchy and she does have a white vaginal discharge at the moment.  Has not tried anything over-the-counter for the area thus far.  Denies fever, chills, pelvic pain, known exposures to STDs, chance of pregnancy, nausea vomiting diarrhea.  LMP 01/11/2021.     History reviewed. No pertinent past medical history.  Patient Active Problem List   Diagnosis Date Noted  . Vaginal candidiasis 09/26/2020  . Routine screening for STI (sexually transmitted infection) 09/26/2020  . Bacterial vaginosis 04/10/2020  . Contraception management 04/10/2020  . Discolored nails 01/09/2020  . Positive depression screening 01/09/2020  . Abnormal menses 08/20/2019  . GERD (gastroesophageal reflux disease) 07/30/2019  . Generalized anxiety disorder 10/23/2018  . Current severe episode of major depressive disorder without psychotic features without prior episode (HCC) 10/23/2018  . Hematuria 08/29/2018  . External hemorrhoid 08/22/2018  . Vaginal discharge 07/12/2017  . 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 ALLERGIC RHINITIS, SEASONAL 04/01/2009    History reviewed. No pertinent surgical history.  OB History   No obstetric history on file.      Home Medications    Prior to Admission medications   Medication Sig Start Date End Date Taking? Authorizing Provider  doxycycline (VIBRA-TABS) 100 MG tablet Take 1 tablet (100 mg total) by mouth 2 (two) times daily. 01/21/21  Yes  Particia Nearing, PA-C  nystatin cream (MYCOSTATIN) Apply to affected area 2 times daily 01/21/21  Yes Particia Nearing, PA-C  acetaminophen (TYLENOL) 500 MG tablet Take 1 tablet (500 mg total) by mouth every 6 (six) hours as needed. 01/17/20   Lamptey, Britta Mccreedy, MD  benzonatate (TESSALON) 100 MG capsule Take 1 capsule (100 mg total) by mouth every 8 (eight) hours. 01/15/21   Merrilee Jansky, MD  cetirizine (ZYRTEC) 10 MG tablet Take 1 tablet (10 mg total) by mouth daily. 11/22/18   Howard Pouch, MD  cyclobenzaprine (FLEXERIL) 10 MG tablet Take 1 tablet (10 mg total) by mouth 2 (two) times daily as needed for muscle spasms. 06/29/20   Farrel Gordon, PA-C  ondansetron (ZOFRAN ODT) 4 MG disintegrating tablet Take 1 tablet (4 mg total) by mouth every 8 (eight) hours as needed for nausea or vomiting. 01/17/20   Lamptey, Britta Mccreedy, MD  pantoprazole (PROTONIX) 20 MG tablet Take 1 tablet (20 mg total) by mouth daily. 01/17/20   Merrilee Jansky, MD  promethazine-dextromethorphan (PROMETHAZINE-DM) 6.25-15 MG/5ML syrup Take 5 mLs by mouth 4 (four) times daily as needed for cough. 06/26/19   Eustace Moore, MD  escitalopram (LEXAPRO) 10 MG tablet Take 0.5 tablets (5 mg total) by mouth daily. 10/23/18 01/17/20  Lennox Solders, MD  FLUoxetine (PROZAC) 20 MG tablet Take 1 tablet (20 mg total) by mouth daily. 12/18/19 01/17/20  Mirian Mo, MD  ipratropium (ATROVENT) 0.06 % nasal spray Place 2 sprays into both nostrils 4 (four) times daily. 07/26/18 01/17/20  Belinda Fisher, PA-C  omeprazole (PRILOSEC)  20 MG capsule Take 1 capsule (20 mg total) by mouth daily. 08/29/18 06/26/19  Tillman Sers, DO    Family History Family History  Problem Relation Age of Onset  . Lupus Mother     Social History Social History   Tobacco Use  . Smoking status: Never Smoker  . Smokeless tobacco: Never Used  Substance Use Topics  . Alcohol use: Yes    Comment: occ  . Drug use: No     Allergies   Patient has no known  allergies.   Review of Systems Review of Systems Per HPI Physical Exam Triage Vital Signs ED Triage Vitals  Enc Vitals Group     BP 01/21/21 1230 108/69     Pulse Rate 01/21/21 1230 88     Resp 01/21/21 1230 18     Temp 01/21/21 1230 98.2 F (36.8 C)     Temp Source 01/21/21 1230 Oral     SpO2 01/21/21 1230 98 %     Weight --      Height --      Head Circumference --      Peak Flow --      Pain Score 01/21/21 1228 7     Pain Loc --      Pain Edu? --      Excl. in GC? --    No data found.  Updated Vital Signs BP 108/69   Pulse 88   Temp 98.2 F (36.8 C) (Oral)   Resp 18   LMP 01/11/2021 (Exact Date)   SpO2 98%   Visual Acuity Right Eye Distance:   Left Eye Distance:   Bilateral Distance:    Right Eye Near:   Left Eye Near:    Bilateral Near:     Physical Exam Vitals and nursing note reviewed. Exam conducted with a chaperone present.  Constitutional:      Appearance: Normal appearance. She is not ill-appearing.  HENT:     Head: Atraumatic.  Eyes:     Extraocular Movements: Extraocular movements intact.     Conjunctiva/sclera: Conjunctivae normal.  Cardiovascular:     Rate and Rhythm: Normal rate and regular rhythm.     Heart sounds: Normal heart sounds.  Pulmonary:     Effort: Pulmonary effort is normal.     Breath sounds: Normal breath sounds.  Abdominal:     General: Bowel sounds are normal. There is no distension.     Palpations: Abdomen is soft.     Tenderness: There is no abdominal tenderness. There is no right CVA tenderness, left CVA tenderness or guarding.  Genitourinary:      Comments: Thick white vaginal discharge present within vaginal canal Musculoskeletal:        General: Normal range of motion.     Cervical back: Normal range of motion and neck supple.  Skin:    General: Skin is warm and dry.  Neurological:     Mental Status: She is alert and oriented to person, place, and time.  Psychiatric:        Mood and Affect: Mood normal.         Thought Content: Thought content normal.        Judgment: Judgment normal.      UC Treatments / Results  Labs (all labs ordered are listed, but only abnormal results are displayed) Labs Reviewed  CERVICOVAGINAL ANCILLARY ONLY    EKG   Radiology No results found.  Procedures Procedures (including critical care time)  Medications  Ordered in UC Medications - No data to display  Initial Impression / Assessment and Plan / UC Course  I have reviewed the triage vital signs and the nursing notes.  Pertinent labs & imaging results that were available during my care of the patient were reviewed by me and considered in my medical decision making (see chart for details).     Vaginal swab pending, will treat with doxycycline, nystatin cream to cover both bacterial and candidal causes while awaiting these results.  Discussed Aquaphor to the area for further protection, particularly with urination.  Avoid any sexual interaction until results return and symptoms resolved.  Over-the-counter pain relievers as needed for irritation.  Final Clinical Impressions(s) / UC Diagnoses   Final diagnoses:  Infection of labia  Vaginal discharge   Discharge Instructions   None    ED Prescriptions    Medication Sig Dispense Auth. Provider   doxycycline (VIBRA-TABS) 100 MG tablet Take 1 tablet (100 mg total) by mouth 2 (two) times daily. 14 tablet Roosvelt Maser [place], [place]   nystatin cream (MYCOSTATIN) Apply to affected area 2 times daily 60 g Particia Nearing, [place]     PDMP not reviewed this encounter.   Particia Nearing, [place] 01/21/21 1311

## 2021-01-22 LAB — CERVICOVAGINAL ANCILLARY ONLY
Bacterial Vaginitis (gardnerella): POSITIVE — AB
Candida Glabrata: NEGATIVE
Candida Vaginitis: NEGATIVE
Chlamydia: NEGATIVE
Comment: NEGATIVE
Comment: NEGATIVE
Comment: NEGATIVE
Comment: NEGATIVE
Comment: NEGATIVE
Comment: NORMAL
Neisseria Gonorrhea: NEGATIVE
Trichomonas: NEGATIVE

## 2021-01-23 ENCOUNTER — Telehealth (HOSPITAL_COMMUNITY): Payer: Self-pay | Admitting: Emergency Medicine

## 2021-01-23 MED ORDER — METRONIDAZOLE 500 MG PO TABS: 500.0000 mg | ORAL_TABLET | Freq: Two times a day (BID) | ORAL | 0 refills | Status: DC

## 2021-03-11 ENCOUNTER — Other Ambulatory Visit (HOSPITAL_COMMUNITY)
Admission: RE | Admit: 2021-03-11 | Discharge: 2021-03-11 | Disposition: A | Discharge status: Home / Self Care | Payer: Medicaid Other | Source: Ambulatory Visit | Attending: Family Medicine | Admitting: Family Medicine

## 2021-03-11 ENCOUNTER — Encounter: Payer: Self-pay | Admitting: Family Medicine

## 2021-03-11 ENCOUNTER — Ambulatory Visit (INDEPENDENT_AMBULATORY_CARE_PROVIDER_SITE_OTHER): Payer: Medicaid Other | Admitting: Family Medicine

## 2021-03-11 ENCOUNTER — Other Ambulatory Visit: Payer: Self-pay

## 2021-03-11 VITALS — HR 86 | Ht 64.0 in | Wt 175.0 lb

## 2021-03-11 DIAGNOSIS — N898 Other specified noninflammatory disorders of vagina: Secondary | ICD-10-CM | POA: Diagnosis not present

## 2021-03-11 LAB — POCT WET PREP (WET MOUNT)
Clue Cells Wet Prep Whiff POC: NEGATIVE
Trichomonas Wet Prep HPF POC: ABSENT

## 2021-03-11 NOTE — Progress Notes (Signed)
    SUBJECTIVE:   CHIEF COMPLAINT / HPI:   Vaginal discharge Rhonda Dixon reports that she has been having vaginal discharge for roughly the past 2 weeks.  She notes that it does itch a little bit but she is primarily concerned about the malodor.  She describes the discharge is thin and white.  She has mild suprapubic discomfort but no nausea, vomiting or fevers.  She has been in a monogamous relationship with 1 woman for the past 2 years.  She has not had any new partners since her last STI testing in 09/2020.  PERTINENT  PMH / PSH: Previous history of vaginal candidiasis and BV.  OBJECTIVE:   Pulse 86   Ht 5\' 4"  (1.626 m)   Wt 175 lb (79.4 kg)   LMP 03/02/2021   SpO2 100%   BMI 30.04 kg/m   General: Alert and cooperative and appears to be in no acute distress Respiratory: Breathing comfortably on room air.  No respiratory distress. Pelvic: Normal-appearing external genitalia.  Moist, pink vaginal mucosa.  Mild leukorrhea.  No significant plaque formation.  No significant malodor.  Samples taken.  No bimanual exam performed.  Chaperone present. Abdomen: Bowel sounds normal. Abdomen soft and non-tender.  Extremities: No peripheral edema. Warm/ well perfused.  Strong radial pulses.   ASSESSMENT/PLAN:   Vaginal discharge Wet prep showed no evidence of yeast infection, BV or Trichomonas.  She has a low suspicion for STIs based on no new sexual partners in the past 2 years.  We will send out for gonorrhea/chlamydia testing.  We discussed the possibility of empiric treatment for yeast despite the negative wet prep.  Based on our conversation, I have a very low suspicion for yeast infection and she noted that she is primarily concerned about malodor as opposed to significant itching and discharge.  We did not move forward with empiric treatment for yeast.  We did discuss appropriate, routine pelvic hygiene. -Follow-up GC/chlamydia     03/04/2021, MD [facility]

## 2021-03-11 NOTE — Assessment & Plan Note (Signed)
Wet prep showed no evidence of yeast infection, BV or Trichomonas.  She has a low suspicion for STIs based on no new sexual partners in the past 2 years.  We will send out for gonorrhea/chlamydia testing.  We discussed the possibility of empiric treatment for yeast despite the negative wet prep.  Based on our conversation, I have a very low suspicion for yeast infection and she noted that she is primarily concerned about malodor as opposed to significant itching and discharge.  We did not move forward with empiric treatment for yeast.  We did discuss appropriate, routine pelvic hygiene. -Follow-up GC/chlamydia

## 2021-03-12 LAB — CERVICOVAGINAL ANCILLARY ONLY
Chlamydia: NEGATIVE
Comment: NEGATIVE
Comment: NEGATIVE
Comment: NORMAL
Neisseria Gonorrhea: NEGATIVE
Trichomonas: NEGATIVE

## 2021-04-06 ENCOUNTER — Other Ambulatory Visit: Payer: Self-pay

## 2021-04-06 ENCOUNTER — Emergency Department (HOSPITAL_COMMUNITY)
Admission: EM | Admit: 2021-04-06 | Discharge: 2021-04-06 | Disposition: A | Discharge status: Home / Self Care | Payer: Medicaid Other | Attending: Emergency Medicine | Admitting: Emergency Medicine

## 2021-04-06 ENCOUNTER — Encounter (HOSPITAL_COMMUNITY): Payer: Self-pay

## 2021-04-06 DIAGNOSIS — R079 Chest pain, unspecified: Secondary | ICD-10-CM | POA: Diagnosis not present

## 2021-04-06 DIAGNOSIS — R0789 Other chest pain: Secondary | ICD-10-CM | POA: Diagnosis not present

## 2021-04-06 DIAGNOSIS — M940 Chondrocostal junction syndrome [Tietze]: Secondary | ICD-10-CM | POA: Diagnosis not present

## 2021-04-06 NOTE — Discharge Instructions (Addendum)
Your evaluated for your chest pain.  EKG showed no evidence of abnormal heart beats or heart attack.  Our evaluation showed low degree of risk for concerning disease such as a blood clot in your lung, collapsed lung, pneumonia.  You should take 2 ibuprofen tablets (200 mg x 2 totaling 400 mg) and alternate this with Tylenol x1 (325-500 mg) every 3-4 hours to allow both of these medications to build up in your blood to improve your pain.  You should follow-up with your primary care doctor in the next couple days for reevaluation further management.  Use the number above if you are unable to follow-up with them.  Please return to the ER for any worsening, including severe chest pain, profuse sweating, shortness of breath, new vomiting.

## 2021-04-06 NOTE — ED Provider Notes (Signed)
[facility] EMERGENCY DEPARTMENT Provider Note   CSN: [id number] Arrival date & time: 04/06/21  6803     History No chief complaint on file.   Rhonda Dixon is a 20 y.o. female.  HPI  20 year old female PMHx GERD, GAD, MDD, presenting for chest pain.  Onset suddenly approximately 8 hours ago, when she sat out up out of bed, moderate severity, located centrally, worse with movement.  No prior CV history.  No known injury.  States she does carry heavy objects at work.  No further medical concerns cynically fevers, chills, diaphoresis, sore throat, rhinorrhea, sneezing, shortness of breath, pedal edema, palpitations, N/V, abdominal pain, bowel/bladder changes, headache, visual change, focal paresthesia/weakness.  History obtained from patient and chart review.  History reviewed. No pertinent past medical history.  Patient Active Problem List   Diagnosis Date Noted   Routine screening for STI (sexually transmitted infection) 09/26/2020   Contraception management 04/10/2020   Discolored nails 01/09/2020   Positive depression screening 01/09/2020   Abnormal menses 08/20/2019   GERD (gastroesophageal reflux disease) 07/30/2019   Generalized anxiety disorder 10/23/2018   Current severe episode of major depressive disorder without psychotic features without prior episode (HCC) 10/23/2018   Hematuria 08/29/2018   External hemorrhoid 08/22/2018   Vaginal discharge 07/12/2017   Nocturnal enuresis 01/04/2013   ALLERGIC RHINITIS, SEASONAL 04/01/2009    History reviewed. No pertinent surgical history.   OB History   No obstetric history on file.     Family History  Problem Relation Age of Onset   Lupus Mother     Social History   Tobacco Use   Smoking status: Never   Smokeless tobacco: Never  Substance Use Topics   Alcohol use: Yes    Comment: occ   Drug use: No    Home Medications Prior to Admission medications   Medication Sig Start Date End  Date Taking? Authorizing Provider  acetaminophen (TYLENOL) 500 MG tablet Take 1 tablet (500 mg total) by mouth every 6 (six) hours as needed. 01/17/20   Lamptey, Britta Mccreedy, MD  benzonatate (TESSALON) 100 MG capsule Take 1 capsule (100 mg total) by mouth every 8 (eight) hours. 01/15/21   Merrilee Jansky, MD  cetirizine (ZYRTEC) 10 MG tablet Take 1 tablet (10 mg total) by mouth daily. 11/22/18   Howard Pouch, MD  cyclobenzaprine (FLEXERIL) 10 MG tablet Take 1 tablet (10 mg total) by mouth 2 (two) times daily as needed for muscle spasms. 06/29/20   Farrel Gordon, PA-C  doxycycline (VIBRA-TABS) 100 MG tablet Take 1 tablet (100 mg total) by mouth 2 (two) times daily. 01/21/21   Particia Nearing, PA-C  metroNIDAZOLE (FLAGYL) 500 MG tablet Take 1 tablet (500 mg total) by mouth 2 (two) times daily. 01/23/21   Merrilee Jansky, MD  nystatin cream (MYCOSTATIN) Apply to affected area 2 times daily 01/21/21   Particia Nearing, PA-C  ondansetron (ZOFRAN ODT) 4 MG disintegrating tablet Take 1 tablet (4 mg total) by mouth every 8 (eight) hours as needed for nausea or vomiting. 01/17/20   Lamptey, Britta Mccreedy, MD  pantoprazole (PROTONIX) 20 MG tablet Take 1 tablet (20 mg total) by mouth daily. 01/17/20   Merrilee Jansky, MD  promethazine-dextromethorphan (PROMETHAZINE-DM) 6.25-15 MG/5ML syrup Take 5 mLs by mouth 4 (four) times daily as needed for cough. 06/26/19   Eustace Moore, MD  escitalopram (LEXAPRO) 10 MG tablet Take 0.5 tablets (5 mg total) by mouth daily. 10/23/18 01/17/20  Lezlie Octave  C, MD  FLUoxetine (PROZAC) 20 MG tablet Take 1 tablet (20 mg total) by mouth daily. 12/18/19 01/17/20  Mirian Mo, MD  ipratropium (ATROVENT) 0.06 % nasal spray Place 2 sprays into both nostrils 4 (four) times daily. 07/26/18 01/17/20  Belinda Fisher, PA-C  omeprazole (PRILOSEC) 20 MG capsule Take 1 capsule (20 mg total) by mouth daily. 08/29/18 06/26/19  Tillman Sers, DO    Allergies    Patient has no known  allergies.  Review of Systems   Review of Systems  All other systems reviewed and are negative.  Physical Exam Updated Vital Signs BP 131/72   Pulse 75   Temp 98.1 F (36.7 C)   Resp 16   SpO2 100%   Physical Exam Vitals and nursing note reviewed.  Constitutional:      General: She is not in acute distress.    Appearance: She is well-developed.  HENT:     Head: Normocephalic and atraumatic.  Eyes:     Extraocular Movements: Extraocular movements intact.     Conjunctiva/sclera: Conjunctivae normal.     Pupils: Pupils are equal, round, and reactive to light.  Cardiovascular:     Rate and Rhythm: Normal rate and regular rhythm.     Heart sounds: No murmur heard. Pulmonary:     Effort: Pulmonary effort is normal. No respiratory distress.     Breath sounds: Normal breath sounds.  Abdominal:     General: There is no distension.     Palpations: Abdomen is soft.     Tenderness: There is no abdominal tenderness. There is no guarding or rebound.  Musculoskeletal:     Cervical back: Neck supple. No rigidity.     Right lower leg: No edema.     Left lower leg: No edema.     Comments: Tender to palpation over sternum, no deformity or crepitus  Skin:    General: Skin is warm and dry.  Neurological:     Mental Status: She is alert and oriented to person, place, and time. Mental status is at baseline.  Psychiatric:        Mood and Affect: Mood normal.        Behavior: Behavior normal.    ED Results / Procedures / Treatments   Labs (all labs ordered are listed, but only abnormal results are displayed) Labs Reviewed - No data to display  EKG EKG Interpretation  Date/Time:  Monday April 06 2021 09:33:00 EDT Ventricular Rate:  74 PR Interval:  158 QRS Duration: 82 QT Interval:  358 QTC Calculation: 397 R Axis:   70 Text Interpretation: Normal sinus rhythm Normal ECG NSR Confirmed by Coralee Pesa ([id number]) on 04/06/2021 8:10:05 PM  Radiology No results  found.  Procedures Procedures   Medications Ordered in ED Medications - No data to display  ED Course  I have reviewed the triage vital signs and the nursing notes.  Pertinent labs & imaging results that were available during my care of the patient were reviewed by me and considered in my medical decision making (see chart for details).    MDM Rules/Calculators/A&P                         This is a 20 year old female with no relevant past medical history, nor significant cardiovascular risk factors, presenting for chest pain after sitting up this morning.  On exam, AF, VSS, pain reproducible to palpation over sternum.  All studies independently reviewed by myself,  d/w the attending physician, factored into my MDM. -EKG: NSR 74 bpm, normal axis, normal intervals, no acute ST/T changes; no prior for comparison  Presentation appears most consistent with musculoskeletal chest pain, costochondritis.  Reassuring EKG.  HEAR score low risk.  PERC negative.  No tearing chest pain radiating posteriorly, positive for cyst, or focal neurodeficits.  No localizing breath sounds or associated infectious symptoms.  Lungs CTA bilaterally, no tracheal deviation.  No overlying rash such as shingles.  Therefore, feel the patient is stable for discharge home with close outpatient follow-up.  Recommended alternating ibuprofen and Tylenol to treat the pain.  Return precautions discussed.  She understands and agrees with this plan.  Patient HDS on reevaluation, nontoxic, ambulatory, subsequently discharged.  Final Clinical Impression(s) / ED Diagnoses Final diagnoses:  Costochondritis    Rx / DC Orders ED Discharge Orders     None        Colvin Caroli, MD 04/07/21 0211    Rozelle Logan, DO 04/07/21 2233

## 2021-04-06 NOTE — ED Provider Notes (Signed)
Emergency Medicine Provider Triage Evaluation Note  CRESENCIA ASMUS , a 20 y.o. female  was evaluated in triage.  Pt complains of chest wall pain.  Pain when she sat up out of bed.  Central.  Has been constant since. worse with movement.  No PE risk factors.  No previous cardiac problems.  Review of Systems  Positive: cp Negative:  N/v  Physical Exam  BP 115/76 (BP Location: Left Arm)   Pulse 77   Temp 98.9 F (37.2 C) (Oral)   Resp 16   SpO2 100%  Gen:   Awake, no distress   Resp:  Normal effort  MSK:   Moves extremities without difficulty. Ttp of the chest wall. Clear lung sounds Other:  No leg pain or swelling  Medical Decision Making  Medically screening exam initiated at 9:36 AM.  Appropriate orders placed.  Zanna Hawn Conaway was informed that the remainder of the evaluation will be completed by another provider, this initial triage assessment does not replace that evaluation, and the importance of remaining in the ED until their evaluation is complete.  Ekg. Likely Winfield Rast, PA-C 04/06/21 [id number]    Mancel Bale, MD 04/07/21 2202

## 2021-04-06 NOTE — ED Triage Notes (Signed)
Patient arrived by [facility] for chest wall pain. Patient states that it started when she raised up to get out of bed. Patient alert and oriented, NAD

## 2021-05-08 ENCOUNTER — Other Ambulatory Visit: Payer: Self-pay

## 2021-05-08 ENCOUNTER — Ambulatory Visit (INDEPENDENT_AMBULATORY_CARE_PROVIDER_SITE_OTHER): Payer: Medicaid Other | Admitting: Family Medicine

## 2021-05-08 ENCOUNTER — Other Ambulatory Visit (HOSPITAL_COMMUNITY)
Admission: RE | Admit: 2021-05-08 | Discharge: 2021-05-08 | Disposition: A | Discharge status: Home / Self Care | Payer: Medicaid Other | Source: Ambulatory Visit | Attending: Family Medicine | Admitting: Family Medicine

## 2021-05-08 VITALS — BP 112/80 | HR 82 | Ht 64.0 in | Wt 177.6 lb

## 2021-05-08 DIAGNOSIS — Z789 Other specified health status: Secondary | ICD-10-CM | POA: Insufficient documentation

## 2021-05-08 DIAGNOSIS — N760 Acute vaginitis: Secondary | ICD-10-CM | POA: Diagnosis not present

## 2021-05-08 DIAGNOSIS — B3731 Acute candidiasis of vulva and vagina: Secondary | ICD-10-CM

## 2021-05-08 DIAGNOSIS — B373 Candidiasis of vulva and vagina: Secondary | ICD-10-CM | POA: Diagnosis not present

## 2021-05-08 DIAGNOSIS — B9689 Other specified bacterial agents as the cause of diseases classified elsewhere: Secondary | ICD-10-CM

## 2021-05-08 DIAGNOSIS — N898 Other specified noninflammatory disorders of vagina: Secondary | ICD-10-CM

## 2021-05-08 DIAGNOSIS — Z3200 Encounter for pregnancy test, result unknown: Secondary | ICD-10-CM | POA: Diagnosis not present

## 2021-05-08 LAB — POCT WET PREP (WET MOUNT)
Clue Cells Wet Prep Whiff POC: POSITIVE
Trichomonas Wet Prep HPF POC: ABSENT

## 2021-05-08 LAB — POCT URINE PREGNANCY: Preg Test, Ur: NEGATIVE

## 2021-05-08 MED ORDER — FLUCONAZOLE 150 MG PO TABS: 150.0000 mg | ORAL_TABLET | Freq: Once | ORAL | 0 refills | Status: AC

## 2021-05-08 MED ORDER — METRONIDAZOLE 500 MG PO TABS
500.0000 mg | ORAL_TABLET | Freq: Two times a day (BID) | ORAL | 0 refills | Status: DC
Start: 2021-05-08 — End: 2021-09-25

## 2021-05-08 NOTE — Progress Notes (Signed)
    SUBJECTIVE:   CHIEF COMPLAINT / HPI:   Patient reports she has noted a change in her vaginal odor over the last 2 weeks.  She has noted some discharge but feels that it has been her normal discharge.  She has had a yeast infection before that was very similar to this.  Patient does report she has a new sexual partner and would like to be STD tested but does not want to get blood work at this time.  Patient does note that she is currently planning for pregnancy with her partner.  Her family is aware of this decision.  Is only been 3 weeks since her last menstrual cycle but she is willing to check if she is pregnant at this time.  Patient has some understanding of ovulation cycles.  PERTINENT  PMH / PSH: Reviewed  OBJECTIVE:   BP 112/80   Pulse 82   Ht 5\' 4"  (1.626 m)   Wt 177 lb 9.6 oz (80.6 kg)   LMP 04/17/2021   SpO2 100%   BMI 30.48 kg/m   General: NAD, well-appearing, well-nourished Respiratory: No respiratory distress, breathing comfortably, able to speak in full sentences Skin: warm and dry, no rashes noted on exposed skin Psych: Appropriate affect and mood Pelvic exam: VULVA: normal appearing vulva with no masses, tenderness or lesions, VAGINA: normal appearing vagina with normal color and discharge, no lesions, vaginal discharge - white and curd-like, CERVIX: normal appearing cervix without discharge or lesions, PAP: Pap smear done today, WET MOUNT done - results: KOH done, clue cells, excessive bacteria, exam chaperoned by 06/17/2021, CMA.  ASSESSMENT/PLAN:   Vaginal discharge Vaginal odor and discharge for the last 2 weeks.  Currently not having any other symptoms at this time.  Wet mount was completed and showed positive for bacterial vaginosis and yeast infection. - Diflucan 150 mg x 1 dose - Metronidazole 500 mg twice daily x7 days (to be started after Diflucan completed)  Trying to get pregnant Currently trying to get pregnant, most recent menstrual cycle was  at the beginning of this month.  Pregnancy test today was negative.  Counseled patient on timing with ovulation and recommended a menstrual cycle tracker.  Patient to monitor and if no menstrual cycle over the next 2 to 3 weeks we will take a home pregnancy test and come back to the clinic if it is positive.     Cleatrice Burke, DO [facility]

## 2021-05-08 NOTE — Patient Instructions (Addendum)
You have bacterial vaginosis and a yeast infection as well.  I will send in the medications.  I would take the Diflucan 1 day and then the next day you can start the metronidazole.  Please let me know if you have any issues or concerns.  I will send this medication once your pregnancy test comes back.  If you are trying to get pregnant, I do recommend getting a period tracker app as most of them will help estimate when your ovulation is.  It is less about the amount of intercourse you are having and more about the timing of it and those days around ovulation are the most likely days where you could have a successful pregnancy.  If you do not have your period within the next couple of weeks take a home test and if that is positive come back to the clinic and we can do more labs.  We also did your Pap smear today, this result can take a couple days to come back so I will contact you if there is any issues.  Otherwise you may just get a MyChart message letting of the results.

## 2021-05-08 NOTE — Assessment & Plan Note (Signed)
Vaginal odor and discharge for the last 2 weeks.  Currently not having any other symptoms at this time.  Wet mount was completed and showed positive for bacterial vaginosis and yeast infection. - Diflucan 150 mg x 1 dose - Metronidazole 500 mg twice daily x7 days (to be started after Diflucan completed)

## 2021-05-08 NOTE — Assessment & Plan Note (Signed)
Currently trying to get pregnant, most recent menstrual cycle was at the beginning of this month.  Pregnancy test today was negative.  Counseled patient on timing with ovulation and recommended a menstrual cycle tracker.  Patient to monitor and if no menstrual cycle over the next 2 to 3 weeks we will take a home pregnancy test and come back to the clinic if it is positive.

## 2021-05-13 LAB — CYTOLOGY - PAP
Chlamydia: NEGATIVE
Comment: NEGATIVE
Comment: NEGATIVE
Comment: NORMAL
Diagnosis: UNDETERMINED — AB
High risk HPV: POSITIVE — AB
Neisseria Gonorrhea: NEGATIVE

## 2021-06-04 ENCOUNTER — Ambulatory Visit: Payer: Medicaid Other

## 2021-06-18 ENCOUNTER — Other Ambulatory Visit: Payer: Self-pay

## 2021-06-18 ENCOUNTER — Ambulatory Visit (INDEPENDENT_AMBULATORY_CARE_PROVIDER_SITE_OTHER): Payer: Medicaid Other | Admitting: Family Medicine

## 2021-06-18 VITALS — BP 115/79 | HR 87 | Wt 182.0 lb

## 2021-06-18 DIAGNOSIS — R300 Dysuria: Secondary | ICD-10-CM

## 2021-06-18 DIAGNOSIS — N926 Irregular menstruation, unspecified: Secondary | ICD-10-CM | POA: Diagnosis not present

## 2021-06-18 DIAGNOSIS — N898 Other specified noninflammatory disorders of vagina: Secondary | ICD-10-CM

## 2021-06-18 LAB — POCT WET PREP (WET MOUNT)
Clue Cells Wet Prep Whiff POC: NEGATIVE
Trichomonas Wet Prep HPF POC: ABSENT

## 2021-06-18 LAB — POCT URINE PREGNANCY: Preg Test, Ur: NEGATIVE

## 2021-06-18 NOTE — Patient Instructions (Signed)
Urine pregnancy test is negative. If you desire pregnancy consider getting eve app to track cycle for time of ovulation.   For other tests I will notify you via mychart or phone.   Remember to follow up next August for a repeat pap smear.   Dr. Salvadore Dom

## 2021-06-18 NOTE — Progress Notes (Signed)
    SUBJECTIVE:   CHIEF COMPLAINT / HPI:   Rhonda Dixon is a 20 yo F who presents with her partner for the issues below.   Vaginal irritation Experiencing increased vaginal discharge and pruritis. Associated symptom of dysuria. Denies vaginal odor, pain with intercourse, vaginal bleeding, blood in urine, and back pain. Declines STD testing.   Missed Period-trying to conceive LMP 05/2021. She is unsure of actual date. Attempting to conceived for one month now.   PERTINENT  PMH / PSH: Hx of depression  OBJECTIVE:   BP 115/79   Pulse 87   Wt 182 lb (82.6 kg)   LMP 05/14/2021 (Approximate)   BMI 31.24 kg/m   Flowsheet Row Office Visit from 06/18/2021 in [facility]  PHQ-9 Total Score 8       General: Appears well, no acute distress. Age appropriate Respiratory: normal effort Pelvic exam: normal external genitalia, vulva, vagina, cervix, uterus and adnexa, WET MOUNT done - results: pending, exam chaperoned by Chase Picket, CMA. ASSESSMENT/PLAN:   1. Vaginal irritation Previously treated for yeast and BV 8/26. Symptoms reportedly similar after resuming sex. No post coital bleeding or foul odor. Will obtain wet mount. Patient declined STD testing at this time.  - POCT Wet Prep [facility]); if positive will treat  2. Dysuria Endorsing a burning sensation when she is urinating along with the symptoms above. Denies back and lower abdominal pain. Consider UTI v. Yeast infection v. STI. Patient declined STD testing.  - Urinalysis, dipstick only - Urine Culture; if positive will treat  3. Missed period LMP in September, unknown. Obtain UPT. Patient trying to conceive.  - POCT urine pregnancy   [name], DO [facility]

## 2021-06-19 LAB — URINALYSIS, DIPSTICK ONLY
Bilirubin, UA: NEGATIVE
Glucose, UA: NEGATIVE
Ketones, UA: NEGATIVE
Nitrite, UA: NEGATIVE
Specific Gravity, UA: 1.027 (ref 1.005–1.030)
Urobilinogen, Ur: 0.2 mg/dL (ref 0.2–1.0)
pH, UA: 5.5 (ref 5.0–7.5)

## 2021-06-23 LAB — URINE CULTURE

## 2021-06-25 ENCOUNTER — Encounter: Payer: Self-pay | Admitting: Family Medicine

## 2021-06-26 ENCOUNTER — Other Ambulatory Visit: Payer: Self-pay | Admitting: Family Medicine

## 2021-06-26 DIAGNOSIS — N342 Other urethritis: Secondary | ICD-10-CM

## 2021-06-26 MED ORDER — CEPHALEXIN 500 MG PO CAPS: 500.0000 mg | ORAL_CAPSULE | Freq: Two times a day (BID) | ORAL | 0 refills | Status: AC

## 2021-07-01 ENCOUNTER — Encounter: Payer: Medicaid Other | Admitting: Student

## 2021-07-01 NOTE — Progress Notes (Deleted)
    SUBJECTIVE:   Chief compliant/HPI: annual examination  Rhonda Dixon is a 20 y.o. who presents today for an annual exam.   Review of systems form notable for ***.   Updated history tabs and problem list ***.   OBJECTIVE:   There were no vitals taken for this visit.  ***  ASSESSMENT/PLAN:   No problem-specific Assessment & Plan notes found for this encounter.    Annual Examination  See AVS for age appropriate recommendations.   PHQ score ***, reviewed and discussed. Blood pressure reviewed and at goal ***.  Asked about intimate partner violence and patient reports ***.  The patient currently uses *** for contraception. Folate recommended as appropriate, minimum of 400 mcg per day.  Advanced directives ***   Considered the following items based upon USPSTF recommendations: HIV testing: {discussed/ordered:14545} Hepatitis C: {discussed/ordered:14545} Hepatitis B: {discussed/ordered:14545} Syphilis if at high risk: {discussed/ordered:14545} GC/CT {GC/CT screening :23818} Lipid panel (nonfasting or fasting) discussed based upon AHA recommendations and {ordered not order:23822}.  Consider repeat every 4-6 years.  Reviewed risk factors for latent tuberculosis and {not indicated/requested/declined:14582}  Discussed family history, BRCA testing {not indicated/requested/declined:14582}. Tool used to risk stratify was Pedigree Assessment tool ***  Cervical cancer screening: {PAPTYPE:23819} Immunizations ***   Follow up in 1  *** year or sooner if indicated.    Gerrit Heck, MD [place]

## 2021-07-20 NOTE — Progress Notes (Signed)
SUBJECTIVE:   Chief compliant/HPI: annual examination  Rhonda Dixon is a 20 y.o. who presents today for an annual exam.   Abnormal pap: in August this year patient had a pap smear that was + for HR HPV and had ASCUS. She will need pap smear repeated in 1 year. She is vaccinated against HPV. Given recent HPV diagnosis, patient desires pelvic exam to check for any signs of genital warts.  HC maintenance: counseled on flu, COVID, and TDAP vaccines.  Fertility issues: patient is trying to conceive for 5 months without success. She has never been on [facility]. She has been trying to get pregnant with the same partner, who has other children. She has STI testing in August that was negative. She reports that she had chlamydia several years ago. Currently in a monogamous heterosexual relationship. Patient reports that her period are regular, not particularly heavy. Last one started 07/14/21. She is concerned that "there is something wrong with [her]" as she expected to get pregnant now. No dyspareunia, attempting coitus 2-4 times per week. She is taking a PNV.  Weight: patient has BMI of 30, class I obesity. She works at [employer] and says that she gets most of her exercise by walking during her work day. She noticed that her diet has been more about expedience and not as intentional lately.  Reviewed and updated history.   Review of systems form not notable.    OBJECTIVE:   BP 114/75   Pulse 77   Wt 189 lb 4 oz (85.8 kg)   LMP 07/14/2021   SpO2 100%   BMI 32.48 kg/m   Nursing note and vitals reviewed GEN: young AAW, resting comfortably in chair, NAD, class I obesity Cardiac: Regular rate and rhythm. Normal S1/S2. No murmurs, rubs, or gallops appreciated. 2+ radial pulses. Lungs: Clear bilaterally to ascultation. No increased WOB, no accessory muscle usage. No w/r/r. Abdomen: Normoactive bowel sounds. No tenderness to deep or light palpation. No rebound or guarding.    PELVIC:  Normal  appearing external female genitalia, normal vaginal epithelium, no abnormal discharge.  Neuro: AOx3  Ext: no edema Psych: Pleasant and appropriate   ASSESSMENT/PLAN:   Trying to get pregnant Counseled patient on cycle timing for ovulation, can use basal thermometer. Also reassured her that it is unlikely that she is infertile at this point as it is normal to not get pregnant within 5-6 months of trying. Will check NG/CT given history of STI in past. Of note, partner has children, if infertility suspected, female partner is unlikely to be the cause at this point. Continue PNV.  Encounter for weight management Patient has BMI of 30, sedentary lifestyle, mostly fast food diet. Recommend implementing small changes that are manageable such as walking for 30 minutes 3 x per week outside of work or trying to achieve 10k steps per day. Also incorporate goals to decrease fried foods and carbs, increase fresh fruits and vegetables. Patient plans on trying to get 10k steps and to eat 2 vegetables with each meal.   Annual Examination  See AVS for age appropriate recommendations  PHQ score 4, reviewed and discussed.  Blood pressure reviewed and at goal.      Considered the following items based upon USPSTF recommendations: HIV testing:  recently done in August and NR Hepatitis C:  recently done in August and NR Hepatitis B: not indicated Syphilis if at high risk: { Recently done in August and NR GC/CTordered Lipid panel (nonfasting or fasting)  discussed based upon AHA recommendations (not indicated) and not ordered.  Reviewed risk factors for latent tuberculosis and not indicated Immunizations Counseled.   Follow up in 1 year or sooner if indicated.    Shirlean Mylar, MD [facility]

## 2021-07-21 ENCOUNTER — Other Ambulatory Visit: Payer: Self-pay

## 2021-07-21 ENCOUNTER — Encounter: Payer: Self-pay | Admitting: Family Medicine

## 2021-07-21 ENCOUNTER — Ambulatory Visit (INDEPENDENT_AMBULATORY_CARE_PROVIDER_SITE_OTHER): Payer: Medicaid Other | Admitting: Family Medicine

## 2021-07-21 ENCOUNTER — Other Ambulatory Visit (HOSPITAL_COMMUNITY)
Admission: RE | Admit: 2021-07-21 | Discharge: 2021-07-21 | Disposition: A | Discharge status: Home / Self Care | Payer: Medicaid Other | Source: Ambulatory Visit | Attending: Family Medicine | Admitting: Family Medicine

## 2021-07-21 VITALS — BP 114/75 | HR 77 | Wt 189.2 lb

## 2021-07-21 DIAGNOSIS — N898 Other specified noninflammatory disorders of vagina: Secondary | ICD-10-CM

## 2021-07-21 DIAGNOSIS — Z7689 Persons encountering health services in other specified circumstances: Secondary | ICD-10-CM | POA: Diagnosis not present

## 2021-07-21 DIAGNOSIS — Z789 Other specified health status: Secondary | ICD-10-CM

## 2021-07-21 NOTE — Patient Instructions (Signed)
It was a pleasure to see you today!  We will get some labs today.  If they are abnormal or we need to do something about them, I will call you.  If they are normal, I will send you a message on MyChart (if it is active) or a letter in the mail.  If you don't hear from [place] in 2 weeks, please call the office  [phone]. For weight loss: increase amount of fruit and vegetables, walk 30 minutes 3 x per week or reach 10,000 steps per day Follow up in 5-6 months if not pregnant   Be Well,  Dr. Leary Roca

## 2021-07-22 LAB — CERVICOVAGINAL ANCILLARY ONLY
Chlamydia: NEGATIVE
Comment: NEGATIVE
Comment: NEGATIVE
Comment: NORMAL
Neisseria Gonorrhea: NEGATIVE
Trichomonas: NEGATIVE

## 2021-07-23 DIAGNOSIS — Z3009 Encounter for other general counseling and advice on contraception: Secondary | ICD-10-CM

## 2021-07-23 DIAGNOSIS — Z7689 Persons encountering health services in other specified circumstances: Secondary | ICD-10-CM | POA: Insufficient documentation

## 2021-07-23 HISTORY — DX: Encounter for other general counseling and advice on contraception: Z30.09

## 2021-07-23 NOTE — Assessment & Plan Note (Signed)
Patient has BMI of 30, sedentary lifestyle, mostly fast food diet. Recommend implementing small changes that are manageable such as walking for 30 minutes 3 x per week outside of work or trying to achieve 10k steps per day. Also incorporate goals to decrease fried foods and carbs, increase fresh fruits and vegetables. Patient plans on trying to get 10k steps and to eat 2 vegetables with each meal.

## 2021-07-23 NOTE — Assessment & Plan Note (Signed)
Counseled patient on cycle timing for ovulation, can use basal thermometer. Also reassured her that it is unlikely that she is infertile at this point as it is normal to not get pregnant within 5-6 months of trying. Will check NG/CT given history of STI in past. Of note, partner has children, if infertility suspected, female partner is unlikely to be the cause at this point. Continue PNV.

## 2021-07-24 ENCOUNTER — Other Ambulatory Visit: Payer: Self-pay

## 2021-07-24 ENCOUNTER — Ambulatory Visit (HOSPITAL_COMMUNITY)
Admission: EM | Admit: 2021-07-24 | Discharge: 2021-07-24 | Disposition: A | Discharge status: Home / Self Care | Payer: Medicaid Other | Attending: Urgent Care | Admitting: Urgent Care

## 2021-07-24 DIAGNOSIS — R051 Acute cough: Secondary | ICD-10-CM | POA: Diagnosis present

## 2021-07-24 DIAGNOSIS — B349 Viral infection, unspecified: Secondary | ICD-10-CM | POA: Diagnosis not present

## 2021-07-24 DIAGNOSIS — R52 Pain, unspecified: Secondary | ICD-10-CM | POA: Diagnosis not present

## 2021-07-24 DIAGNOSIS — Z20822 Contact with and (suspected) exposure to covid-19: Secondary | ICD-10-CM | POA: Diagnosis not present

## 2021-07-24 DIAGNOSIS — R6883 Chills (without fever): Secondary | ICD-10-CM | POA: Diagnosis present

## 2021-07-24 DIAGNOSIS — J101 Influenza due to other identified influenza virus with other respiratory manifestations: Secondary | ICD-10-CM | POA: Insufficient documentation

## 2021-07-24 LAB — POC INFLUENZA A AND B ANTIGEN (URGENT CARE ONLY)
INFLUENZA A ANTIGEN, POC: POSITIVE — AB
INFLUENZA B ANTIGEN, POC: NEGATIVE

## 2021-07-24 MED ORDER — ACETAMINOPHEN 325 MG PO TABS
650.0000 mg | ORAL_TABLET | Freq: Once | ORAL | Status: AC
  Administered 2021-07-24: 650 mg via ORAL

## 2021-07-24 MED ORDER — PROMETHAZINE-DM 6.25-15 MG/5ML PO SYRP: 5.0000 mL | ORAL_SOLUTION | Freq: Every evening | ORAL | 0 refills | Status: DC | PRN

## 2021-07-24 MED ORDER — BENZONATATE 100 MG PO CAPS: 100.0000 mg | ORAL_CAPSULE | Freq: Three times a day (TID) | ORAL | 0 refills | Status: DC | PRN

## 2021-07-24 MED ORDER — ACETAMINOPHEN 325 MG PO TABS
ORAL_TABLET | ORAL | Status: AC
  Filled 2021-07-24: qty 2

## 2021-07-24 NOTE — ED Provider Notes (Addendum)
[facility]   MRN: [id number] DOB: 07-29-2001  Subjective:   Rhonda Dixon is a 20 y.o. female presenting for 3 day history of acute onset persistent malaise, fatigue, body aches, chills, cough that elicits chest pain. Had indirect exposure to influenza and wants to be tested for COVID too.  No headache, ear pain, throat pain, shortness of breath, wheezing.  No nausea, vomiting, abdominal pain.  No current facility-administered medications for this encounter.  Current Outpatient Medications:    acetaminophen (TYLENOL) 500 MG tablet, Take 1 tablet (500 mg total) by mouth every 6 (six) hours as needed., Disp: 30 tablet, Rfl: 0   benzonatate (TESSALON) 100 MG capsule, Take 1 capsule (100 mg total) by mouth every 8 (eight) hours., Disp: 21 capsule, Rfl: 0   cetirizine (ZYRTEC) 10 MG tablet, Take 1 tablet (10 mg total) by mouth daily., Disp: 30 tablet, Rfl: 11   cyclobenzaprine (FLEXERIL) 10 MG tablet, Take 1 tablet (10 mg total) by mouth 2 (two) times daily as needed for muscle spasms., Disp: 20 tablet, Rfl: 0   metroNIDAZOLE (FLAGYL) 500 MG tablet, Take 1 tablet (500 mg total) by mouth 2 (two) times daily., Disp: 14 tablet, Rfl: 0   nystatin cream (MYCOSTATIN), Apply to affected area 2 times daily, Disp: 60 g, Rfl: 0   ondansetron (ZOFRAN ODT) 4 MG disintegrating tablet, Take 1 tablet (4 mg total) by mouth every 8 (eight) hours as needed for nausea or vomiting., Disp: 20 tablet, Rfl: 0   pantoprazole (PROTONIX) 20 MG tablet, Take 1 tablet (20 mg total) by mouth daily., Disp: 30 tablet, Rfl: 0   promethazine-dextromethorphan (PROMETHAZINE-DM) 6.25-15 MG/5ML syrup, Take 5 mLs by mouth 4 (four) times daily as needed for cough., Disp: 118 mL, Rfl: 0   No Known Allergies  No past medical history on file.   No past surgical history on file.  Family History  Problem Relation Age of Onset   Lupus Mother     Social History   Tobacco Use   Smoking status: Never    Smokeless tobacco: Never  Substance Use Topics   Alcohol use: Yes    Comment: occ   Drug use: No    ROS   Objective:   Vitals: BP 106/75 (BP Location: Right Arm)   Pulse 97   Temp (!) 100.6 F (38.1 C) (Oral)   Resp 19   LMP 07/18/2021 (Approximate)   SpO2 97%   Physical Exam Constitutional:      General: She is not in acute distress.    Appearance: Normal appearance. She is well-developed. She is not ill-appearing, toxic-appearing or diaphoretic.  HENT:     Head: Normocephalic and atraumatic.     Right Ear: Tympanic membrane, ear canal and external ear normal. No drainage or tenderness. No middle ear effusion. There is no impacted cerumen. Tympanic membrane is not erythematous.     Left Ear: Tympanic membrane, ear canal and external ear normal. No drainage or tenderness.  No middle ear effusion. There is no impacted cerumen. Tympanic membrane is not erythematous.     Nose: Nose normal. No congestion or rhinorrhea.     Mouth/Throat:     Mouth: Mucous membranes are moist. No oral lesions.     Pharynx: No pharyngeal swelling, oropharyngeal exudate, posterior oropharyngeal erythema or uvula swelling.     Tonsils: No tonsillar exudate or tonsillar abscesses.  Eyes:     General: No scleral icterus.       Right  eye: No discharge.        Left eye: No discharge.     Extraocular Movements: Extraocular movements intact.     Right eye: Normal extraocular motion.     Left eye: Normal extraocular motion.     Conjunctiva/sclera: Conjunctivae normal.     Pupils: Pupils are equal, round, and reactive to light.  Cardiovascular:     Rate and Rhythm: Normal rate and regular rhythm.     Pulses: Normal pulses.     Heart sounds: Normal heart sounds. No murmur heard.   No friction rub. No gallop.  Pulmonary:     Effort: Pulmonary effort is normal. No respiratory distress.     Breath sounds: Normal breath sounds. No stridor. No wheezing, rhonchi or rales.  Musculoskeletal:     Cervical  back: Normal range of motion and neck supple.  Lymphadenopathy:     Cervical: No cervical adenopathy.  Skin:    General: Skin is warm and dry.     Findings: No rash.  Neurological:     General: No focal deficit present.     Mental Status: She is alert and oriented to person, place, and time.  Psychiatric:        Mood and Affect: Mood normal.        Behavior: Behavior normal.        Thought Content: Thought content normal.        Judgment: Judgment normal.   APAP given in clinic for fever.   Assessment and Plan :   PDMP not reviewed this encounter.  1. Acute viral syndrome   2. Acute cough   3. Chills   4. Body aches    The influenza point of care test was taking too long for the patient. We obtained the test, will follow up through mychart. Will treat with supportive care for influenza given current outbreak. Testing pending. Deferred imaging given clear cardiopulmonary exam, hemodynamically stable vital signs. Counseled patient on potential for adverse effects with medications prescribed/recommended today, ER and return-to-clinic precautions discussed, patient verbalized understanding.     Wallis Bamberg, PA-C 07/24/21 1344   Results for orders placed or performed during the hospital encounter of 07/24/21 (from the past 24 hour(s))  POC Influenza A & B Ag (Urgent Care)     Status: Abnormal   Collection Time: 07/24/21  1:47 PM  Result Value Ref Range   INFLUENZA A ANTIGEN, POC POSITIVE (A) NEGATIVE   INFLUENZA B ANTIGEN, POC NEGATIVE NEGATIVE   Patient called and notified about her positive flu A test result.     Wallis Bamberg, PA-C 07/24/21 9833

## 2021-07-24 NOTE — Discharge Instructions (Addendum)
We will notify you of your test results as they arrive and may take between 24 hours.  I encourage you to sign up for MyChart if you have not already done so as this can be the easiest way for [place] to communicate results to you online or through a phone app.  Generally, we only contact you if it is a positive test result.  In the meantime, if you develop worsening symptoms including fever, chest pain, shortness of breath despite our current treatment plan then please report to the emergency room as this may be a sign of worsening status from possible viral infection.  Otherwise, we will manage this as a viral syndrome. For sore throat or cough try using a honey-based tea. Use 3 teaspoons of honey with juice squeezed from half lemon. Place shaved pieces of ginger into 1/2-1 cup of water and warm over stove top. Then mix the ingredients and repeat every 4 hours as needed. Please take Tylenol 500mg -650mg  every 6 hours for aches and pains, fevers. Hydrate very well with at least 2 liters of water. Eat light meals such as soups to replenish electrolytes and soft fruits, veggies. Start an antihistamine like Zyrtec for postnasal drainage, sinus congestion.  You can take this together with pseudoephedrine (Sudafed) at a dose of 30 mg 2-3 times a day as needed for the same kind of congestion.

## 2021-07-24 NOTE — ED Triage Notes (Signed)
Pt presents with coughing, body aches and chills x 3 days. States she wants to be tested for COVID.

## 2021-07-25 LAB — SARS CORONAVIRUS 2 (TAT 6-24 HRS): SARS Coronavirus 2: NEGATIVE

## 2021-09-09 ENCOUNTER — Ambulatory Visit (INDEPENDENT_AMBULATORY_CARE_PROVIDER_SITE_OTHER): Payer: Medicaid Other | Admitting: Family Medicine

## 2021-09-09 ENCOUNTER — Other Ambulatory Visit: Payer: Self-pay

## 2021-09-09 VITALS — BP 104/60 | HR 85 | Wt 193.6 lb

## 2021-09-09 DIAGNOSIS — Z3201 Encounter for pregnancy test, result positive: Secondary | ICD-10-CM | POA: Insufficient documentation

## 2021-09-09 DIAGNOSIS — B379 Candidiasis, unspecified: Secondary | ICD-10-CM | POA: Diagnosis not present

## 2021-09-09 DIAGNOSIS — B3731 Acute candidiasis of vulva and vagina: Secondary | ICD-10-CM | POA: Diagnosis not present

## 2021-09-09 DIAGNOSIS — N898 Other specified noninflammatory disorders of vagina: Secondary | ICD-10-CM | POA: Diagnosis not present

## 2021-09-09 DIAGNOSIS — N926 Irregular menstruation, unspecified: Secondary | ICD-10-CM

## 2021-09-09 LAB — POCT WET PREP (WET MOUNT)
Clue Cells Wet Prep Whiff POC: NEGATIVE
Trichomonas Wet Prep HPF POC: ABSENT

## 2021-09-09 LAB — POCT URINE PREGNANCY: Preg Test, Ur: POSITIVE — AB

## 2021-09-09 MED ORDER — MICONAZOLE NITRATE 2 % EX CREA: 1.0000 "application " | TOPICAL_CREAM | Freq: Two times a day (BID) | CUTANEOUS | 0 refills | Status: AC

## 2021-09-09 MED ORDER — FLUCONAZOLE 150 MG PO TABS: 150.0000 mg | ORAL_TABLET | Freq: Once | ORAL | 0 refills | Status: DC

## 2021-09-09 NOTE — Patient Instructions (Signed)
It was great seeing you today!  We will test today for certain infections, I will let you know of any abnormal results. Please make sure to take a prenatal vitamin daily.   Please follow up at your next scheduled appointment, if anything arises between now and then, please don't hesitate to contact our office.   Thank you for allowing [place] to be a part of your medical care!  Thank you, Dr. Robyne Peers

## 2021-09-09 NOTE — Assessment & Plan Note (Signed)
-  etiology of vaginal symptoms, sent prescription for miconazole topical gel given pregnancy wanted to avoid oral fluconazole

## 2021-09-09 NOTE — Progress Notes (Signed)
° ° °  SUBJECTIVE:   CHIEF COMPLAINT / HPI:   Patient presents with vaginal odor for 2-3 weeks. Denies vaginal irritation, itching, discharge, fever, chills and pelvic pain. Sexually active with 1 female partner, does not use protection since he is her boyfriend and they are cohabiting. She is not on birth control and does not use barrier protection since she is desiring pregnancy. Denies any other symptoms, states that she did have a sore throat recently but this has completely resolved.   OBJECTIVE:   BP 104/60    Pulse 85    Wt 193 lb 9.6 oz (87.8 kg)    LMP 07/28/2021 (Approximate)    SpO2 98%    BMI 33.23 kg/m   General: Patient well-appearing, in no acute distress. CV: RRR, no murmurs or gallops auscultated  Resp: CTAB GU: no rashes or external lesions noted, no vaginal or cervical discharge noted Ext: radial pulses strong and equal bilaterally  Wet prep performed in the presence of chaperone.   ASSESSMENT/PLAN:   Yeast infection -etiology of vaginal symptoms, sent prescription for miconazole topical gel given pregnancy wanted to avoid oral fluconazole    Positive pregnancy test -Upreg positive, called patient to discuss results -instructed to take daily prenatal vitamin -lab visit for initial OB labs and then schedule for initial OB visit, patient aware and will schedule these, OB labs ordered   Reece Leader, DO [facility]

## 2021-09-09 NOTE — Assessment & Plan Note (Signed)
-  Upreg positive, called patient to discuss results -instructed to take daily prenatal vitamin -lab visit for initial OB labs and then schedule for initial OB visit, patient aware and will schedule these, OB labs ordered

## 2021-09-13 NOTE — L&D Delivery Note (Signed)
Delivery Note At 0933 a viable female infant was delivered via SVD, presentation: LOA. APGAR: 9, 9; weight pending.   Placenta status: spontaneously delivered intact with gentle cord traction. Fundus firm with massage and Pitocin.   Anesthesia: epidural and local Lacerations: bilateral labial Suture used for repair: 3-0 Vicryl rapide Est. Blood Loss (mL): 76 Placenta to LD Complications maternal fever immediately post delivery Cord ph n/a   Mom to postpartum. Baby to Couplet care / Skin to Skin.    Donette Larry, CNM 05/16/2022 10:07 AM

## 2021-09-18 ENCOUNTER — Other Ambulatory Visit: Payer: Medicaid Other

## 2021-09-18 ENCOUNTER — Other Ambulatory Visit: Payer: Self-pay

## 2021-09-18 DIAGNOSIS — Z3201 Encounter for pregnancy test, result positive: Secondary | ICD-10-CM

## 2021-09-22 ENCOUNTER — Telehealth: Payer: Self-pay

## 2021-09-22 NOTE — Telephone Encounter (Signed)
Patient calls nurse line stating that she is returning phone call to provider. Advised patient that call may have been to confirm appointment with Dr. Anner Crete on 1/13. Patient states that she had two missed calls.   Please advise if you were trying to reach patient.   Veronda Prude, RN

## 2021-09-23 LAB — CBC/D/PLT+RPR+RH+ABO+RUBIGG...
Basophils Absolute: 0.1 10*3/uL (ref 0.0–0.2)
Basos: 1 %
Bilirubin, UA: NEGATIVE
EOS (ABSOLUTE): 0 10*3/uL (ref 0.0–0.4)
Eos: 1 %
Glucose, UA: NEGATIVE
HCV Ab: <0.1 s/co ratio (ref 0.0–0.9)
HIV Screen 4th Generation wRfx: NONREACTIVE
Hematocrit: 38.1 % (ref 34.0–46.6)
Hemoglobin: 12.8 g/dL (ref 11.1–15.9)
Hepatitis B Surface Ag: NEGATIVE
Immature Grans (Abs): 0 10*3/uL (ref 0.0–0.1)
Immature Granulocytes: 0 %
Ketones, UA: NEGATIVE
Leukocytes,UA: NEGATIVE
Lymphocytes Absolute: 2.1 10*3/uL (ref 0.7–3.1)
Lymphs: 31 %
MCH: 30.1 pg (ref 26.6–33.0)
MCHC: 33.6 g/dL (ref 31.5–35.7)
MCV: 90 fL (ref 79–97)
Monocytes Absolute: 0.6 10*3/uL (ref 0.1–0.9)
Monocytes: 8 %
Neutrophils Absolute: 4.1 10*3/uL (ref 1.4–7.0)
Neutrophils: 59 %
Nitrite, UA: NEGATIVE
Platelets: 354 10*3/uL (ref 150–450)
Protein,UA: NEGATIVE
RBC: 4.25 x10E6/uL (ref 3.77–5.28)
RDW: 12.2 % (ref 11.7–15.4)
RPR Ser Ql: NONREACTIVE
Rh Factor: POSITIVE
Rubella Antibodies, IGG: 1.58 index (ref >0.99)
Specific Gravity, UA: 1.022 (ref 1.005–1.030)
Urobilinogen, Ur: 0.2 mg/dL (ref 0.2–1.0)
WBC: 6.9 10*3/uL (ref 3.4–10.8)
pH, UA: 6 (ref 5.0–7.5)

## 2021-09-23 LAB — HGB FRACTIONATION CASCADE
Hgb A2: 2.9 % (ref 1.8–3.2)
Hgb A: 97.1 % (ref 96.4–98.8)
Hgb F: 0 % (ref 0.0–2.0)
Hgb S: 0 %

## 2021-09-23 LAB — MICROSCOPIC EXAMINATION
Bacteria, UA: NONE SEEN
Casts: NONE SEEN /lpf
WBC, UA: NONE SEEN /hpf (ref 0–5)

## 2021-09-23 LAB — AB SCR+ANTIBODY ID

## 2021-09-23 LAB — URINE CULTURE, OB REFLEX

## 2021-09-23 LAB — HCV INTERPRETATION

## 2021-09-25 ENCOUNTER — Other Ambulatory Visit: Payer: Self-pay

## 2021-09-25 ENCOUNTER — Ambulatory Visit (INDEPENDENT_AMBULATORY_CARE_PROVIDER_SITE_OTHER): Payer: Medicaid Other | Admitting: Family Medicine

## 2021-09-25 ENCOUNTER — Encounter: Payer: Self-pay | Admitting: Family Medicine

## 2021-09-25 ENCOUNTER — Other Ambulatory Visit (HOSPITAL_COMMUNITY)
Admission: RE | Admit: 2021-09-25 | Discharge: 2021-09-25 | Disposition: A | Discharge status: Home / Self Care | Payer: Medicaid Other | Source: Ambulatory Visit | Attending: Family Medicine | Admitting: Family Medicine

## 2021-09-25 VITALS — BP 102/58 | HR 92 | Wt 195.0 lb

## 2021-09-25 DIAGNOSIS — Z3481 Encounter for supervision of other normal pregnancy, first trimester: Secondary | ICD-10-CM

## 2021-09-25 DIAGNOSIS — Z3490 Encounter for supervision of normal pregnancy, unspecified, unspecified trimester: Secondary | ICD-10-CM | POA: Insufficient documentation

## 2021-09-25 NOTE — Progress Notes (Signed)
Patient Name: ANGENI CHAUDHURI Date of Birth: 03/10/2001 [facility] Initial Prenatal Visit  FARYAL MARXEN is a 21 y.o. year old G1P0 at [redacted]w[redacted]d who presents for her initial prenatal visit. Pregnancy is planned She reports breast tenderness and nausea, no vomiting. She is taking a prenatal vitamin.  She denies pelvic pain. 1-2 weeks ago she had an episode of blood (light pink) when she wiped after urinating. No blood in toilet or in underwear. No additional bleeding since.  Pregnancy Dating: The patient is dated by LMP. LMP: approximately 07/28/2021 (unsure of exact date) Period is certain:  No.  Periods were regular:  Yes.  LMP was a typical period:  Yes.  Using hormonal contraception in 3 months prior to conception: No  Lab Review: Blood type: B Rh Status: + Antibody screen: Negative HIV: Negative RPR: Negative Hemoglobin electrophoresis reviewed: Yes, normal Results of OB urine culture are: Negative Rubella: Immune Hep C Ab: Negative Varicella status is Immune (vaccinated)  PMH: Reviewed and as detailed below: HTN: No  Gestational Hypertension/preeclampsia: No  Type 1 or 2 Diabetes: No  Depression:  Yes, not on medication, well-controlled currently Seizure disorder:  No VTE: No ,  History of STI Yes, chlamydia several years ago Abnormal Pap smear:  Yes- ASCUS 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  Summary of prior pregnancies: n/a, G1P0 Cesarean delivery: No  Gestational Diabetes:  No Hypertension in pregnancy: No History of preterm birth: No History of LGA/SGA infant:  No History of shoulder dystocia: No Indications for referral were reviewed, and the patient has no obstetric indications for referral to High [facility] Clinic at this time.   Social  History: Partner's name: Allyson Sabal Tobacco use: Yes- vapes intermittently Alcohol use:  Yes- 1 shot last week Other substance use:  No  Current Medications:  Prenatal vitamin Reviewed and appropriate in pregnancy.   Genetic and Infection Screen: Flow Sheet Updated Yes  Prenatal Exam: Gen: Well nourished, well developed.  No distress.  Vitals noted. HEENT: Normocephalic, atraumatic. Lungs: Normal respiratory effort Abd: soft, NTND.  Uterus not appreciated above pelvis. GU: Normal external female genitalia without lesions.  Nl vaginal, well rugated without lesions. No vaginal discharge.  Bimanual exam: No adnexal mass or TTP. No CMT.  Psych: Normal grooming and dress.  Not depressed or anxious appearing.  Normal thought content and process without flight of ideas or looseness of associations  Fetal heart tones: n/a, only 8 weeks by LMP  Assessment/Plan:  SHALUNDA LINDH is a 21 y.o. G1P0 at [redacted]w[redacted]d by LMP who presents to initiate prenatal care. She is doing well.   Routine prenatal care: As dating is not reliable, a dating ultrasound has been ordered. Scheduled for January 30th. Pre-pregnancy weight updated. Expected weight gain this pregnancy is 15-25 pounds Prenatal labs reviewed, unremarkable. Indications for referral to [facility] were reviewed and the patient does not meet criteria for referral.  Medication list reviewed and updated.  Bleeding and pain precautions reviewed. Importance of prenatal vitamins reviewed.  Genetic screening discussed. Will defer further discussion until dating is known. The patient has the following indications for aspirin to begin 81 mg at 12-16 weeks: One high risk condition: no single high risk condition  MORE than one moderate risk condition: nulliparity, low SES  , and identifies as  African American  Aspirin was  recommended today based upon above risk factors (one high risk condition or more than one moderate risk factor)-- will plan to start  at 12 weeks pending results of dating scan The patient will not be age 29 or over at time of delivery. Referral to genetic counseling was not offered today.  The patient has the following risk factors for preexisting diabetes: BMI > 25 and high risk ethnicity (Latino, [place] American, Native American, [place] Islander, Asian Naval architect) . An early 1 hour glucose tolerance test was not ordered. Recommend at next visit. Pregnancy Medical Home and PHQ-9 forms completed. No problems noted. GC/chlamydia obtained today  2. Pregnancy issues include the following which were addressed today:  Occasional vaping and alcohol use-- counseled    Follow up 4 weeks for next prenatal visit.   Maury Dus, MD PGY-2 [facility]

## 2021-09-25 NOTE — Patient Instructions (Signed)
Commonly Asked Questions During Pregnancy  Cats: A parasite can be excreted in cat feces.  To avoid exposure you need to have another person empty the little box.  If you must empty the litter box you will need to wear gloves.  Wash your hands after handling your cat.  This parasite can also be found in raw or undercooked meat so this should also be avoided.  Colds, Sore Throats, Flu: Please check your medication sheet to see what you can take for symptoms.  If your symptoms are unrelieved by these medications please call the office.  Dental Work: Most any dental work Investment banker, corporate recommends is permitted.  X-rays should only be taken during the first trimester if absolutely necessary.  Your abdomen should be shielded with a lead apron during all x-rays.  Please notify your provider prior to receiving any x-rays.  Novocaine is fine; gas is not recommended.  If your dentist requires a note from [place] prior to dental work please call the office and we will provide one for you.  Exercise: Exercise is an important part of staying healthy during your pregnancy.  You may continue most exercises you were accustomed to prior to pregnancy.  Later in your pregnancy you will most likely notice you have difficulty with activities requiring balance like riding a bicycle.  It is important that you listen to your body and avoid activities that put you at a higher risk of falling.  Adequate rest and staying well hydrated are a must!  If you have questions about the safety of specific activities ask your provider.    Exposure to Children with illness: Try to avoid obvious exposure; report any symptoms to [place] when noted,  If you have chicken pos, red measles or mumps, you should be immune to these diseases.   Please do not take any vaccines while pregnant unless you have checked with your OB provider.  Fetal Movement: After 28 weeks we recommend you do "kick counts" twice daily.  Lie or sit down in a calm quiet environment and  count your baby movements "kicks".  You should feel your baby at least 10 times per hour.  If you have not felt 10 kicks within the first hour get up, walk around and have something sweet to eat or drink then repeat for an additional hour.  If count remains less than 10 per hour notify your provider.  Fumigating: Follow your pest control agent's advice as to how long to stay out of your home.  Ventilate the area well before re-entering.  Hemorrhoids:   Most over-the-counter preparations can be used during pregnancy.  Check your medication to see what is safe to use.  It is important to use a stool softener or fiber in your diet and to drink lots of liquids.  If hemorrhoids seem to be getting worse please call the office.   Hot Tubs:  Hot tubs Jacuzzis and saunas are not recommended while pregnant.  These increase your internal body temperature and should be avoided.  Intercourse:  Sexual intercourse is safe during pregnancy as long as you are comfortable, unless otherwise advised by your provider.  Spotting may occur after intercourse; report any bright red bleeding that is heavier than spotting.  Labor:  If you know that you are in labor, please go to the hospital.  If you are unsure, please call the office and let us help you decide what to do.  Lifting, straining, etc:  If your job requires heavy  lifting or straining please check with your provider for any limitations.  Generally, you should not lift items heavier than that you can lift simply with your hands and arms (no back muscles)  Painting:  Paint fumes do not harm your pregnancy, but may make you ill and should be avoided if possible.  Latex or water based paints have less odor than oils.  Use adequate ventilation while painting.  Permanents & Hair Color:  Chemicals in hair dyes are not recommended as they cause increase hair dryness which can increase hair loss during pregnancy.  " Highlighting" and permanents are allowed.  Dye may be  absorbed differently and permanents may not hold as well during pregnancy.  Sunbathing:  Use a sunscreen, as skin burns easily during pregnancy.  Drink plenty of fluids; avoid over heating.  Tanning Beds:  Because their possible side effects are still unknown, tanning beds are not recommended.  Ultrasound Scans:  Routine ultrasounds are performed at approximately 20 weeks.  You will be able to see your baby's general anatomy an if you would like to know the gender this can usually be determined as well.  If it is questionable when you conceived you may also receive an ultrasound early in your pregnancy for dating purposes.  Otherwise ultrasound exams are not routinely performed unless there is a medical necessity.  Although you can request a scan we ask that you pay for it when conducted because insurance does not cover " patient request" scans.  Work: If your pregnancy proceeds without complications you may work until your due date, unless your physician or employer advises otherwise.  Round Ligament Pain/Pelvic Discomfort:  Sharp, shooting pains not associated with bleeding are fairly common, usually occurring in the second trimester of pregnancy.  They tend to be worse when standing up or when you remain standing for long periods of time.  These are the result of pressure of certain pelvic ligaments called "round ligaments".  Rest, Tylenol and heat seem to be the most effective relief.  As the womb and fetus grow, they rise out of the pelvis and the discomfort improves.  Please notify the office if your pain seems different than that described.  It may represent a more serious condition.     Common Medications Safe in Pregnancy  Acne:      Constipation:  Benzoyl Peroxide     Colace  Clindamycin      Dulcolax Suppository  Topica Erythromycin     Fibercon  Salicylic Acid      Metamucil         Miralax AVOID:        Senakot   Accutane    Cough:  Retin-A       Cough  Drops  Tetracycline      Phenergan w/ Codeine if Rx  Minocycline      Robitussin (Plain & DM)  Antibiotics:     Crabs/Lice:  Ceclor       RID  Cephalosporins    AVOID:  E-Mycins      Kwell  Keflex  Macrobid/Macrodantin   Diarrhea:  Penicillin      Kao-Pectate  Zithromax      Imodium AD         PUSH FLUIDS AVOID:       Cipro     Fever:  Tetracycline      Tylenol (Regular or Extra  Minocycline       Strength)  Levaquin      Extra  Strength-Do not          Exceed 8 tabs/24 hrs Caffeine:        '200mg'$ /day (equiv. To 1 cup of coffee or  approx. 3 12 oz sodas)         Gas: Cold/Hayfever:       Gas-X  Benadryl      Mylicon  Claritin       Phazyme  **Claritin-D        Chlor-Trimeton    Headaches:  Dimetapp      ASA-Free Excedrin  Drixoral-Non-Drowsy     Cold Compress  Mucinex (Guaifenasin)     Tylenol (Regular or Extra  Sudafed/Sudafed-12 Hour     Strength)  **Sudafed PE Pseudoephedrine   Tylenol Cold & Sinus     Vicks Vapor Rub  Zyrtec  **AVOID if Problems With Blood Pressure         Heartburn: Avoid lying down for at least 1 hour after meals  Aciphex      Maalox     Rash:  Milk of Magnesia     Benadryl    Mylanta       1% Hydrocortisone Cream  Pepcid  Pepcid Complete   Sleep Aids:  Prevacid      Ambien   Prilosec       Benadryl  Rolaids       Chamomile Tea  Tums (Limit 4/day)     Unisom         Tylenol PM         Warm milk-add vanilla or  Hemorrhoids:       Sugar for taste  Anusol/Anusol H.C.  (RX: Analapram 2.5%)  Sugar Substitutes:  Hydrocortisone OTC     Ok in moderation  Preparation H      Tucks        Vaseline lotion applied to tissue with wiping    Herpes:     Throat:  Acyclovir      Oragel  Famvir  Valtrex     Vaccines:         Flu Shot Leg Cramps:       *Gardasil  Benadryl      Hepatitis A         Hepatitis B Nasal Spray:       Pneumovax  Saline Nasal Spray     Polio Booster         Tetanus Nausea:       Tuberculosis test or PPD  Vitamin  B6 25 mg TID   AVOID:    Dramamine      *Gardasil  Emetrol       Live Poliovirus  Ginger Root 250 mg QID    MMR (measles, mumps &  High Complex Carbs @ Bedtime    rebella)  Sea Bands-Accupressure    Varicella (Chickenpox)  Unisom 1/2 tab TID     *No known complications           If received before Pain:         Known pregnancy;   Darvocet       Resume series after  Lortab        Delivery  Percocet    Yeast:   Tramadol      Femstat  Tylenol 3      Gyne-lotrimin  Ultram       Monistat  Vicodin           MISC:         All  Sunscreens           Hair Coloring/highlights          Insect Repellant's          (Including DEET)         Mystic Tans    First Trimester of Pregnancy The first trimester of pregnancy starts on the first day of your last menstrual period until the end of week 12. This is months 1 through 3 of pregnancy. A week after a sperm fertilizes an egg, the egg will implant into the wall of the uterus and begin to develop into a baby. By the end of 12 weeks, all the baby's organs will be formed and the baby will be 2-3 inches in size. Body changes during your first trimester Your body goes through many changes during pregnancy. The changes vary and generally return to normal after your baby is born. Physical changes You may gain or lose weight. Your breasts may begin to grow larger and become tender. The tissue that surrounds your nipples (areola) may become darker. Dark spots or blotches (chloasma or mask of pregnancy) may develop on your face. You may have changes in your hair. These can include thickening or thinning of your hair or changes in texture. Health changes You may feel nauseous, and you may vomit. You may have heartburn. You may develop headaches. You may develop constipation. Your gums may bleed and may be sensitive to brushing and flossing. Other changes You may tire easily. You may urinate more often. Your menstrual periods will stop. You may have a  loss of appetite. You may develop cravings for certain kinds of food. You may have changes in your emotions from day to day. You may have more vivid and strange dreams. Follow these instructions at home: Medicines Follow your health care provider's instructions regarding medicine use. Specific medicines may be either safe or unsafe to take during pregnancy. Do not take any medicines unless told to by your health care provider. Take a prenatal vitamin that contains at least 600 micrograms (mcg) of folic acid. Eating and drinking Eat a healthy diet that includes fresh fruits and vegetables, whole grains, good sources of protein such as meat, eggs, or tofu, and low-fat dairy products. Avoid raw meat and unpasteurized juice, milk, and cheese. These carry germs that can harm you and your baby. If you feel nauseous or you vomit: Eat 4 or 5 small meals a day instead of 3 large meals. Try eating a few soda crackers. Drink liquids between meals instead of during meals. You may need to take these actions to prevent or treat constipation: Drink enough fluid to keep your urine pale yellow. Eat foods that are high in fiber, such as beans, whole grains, and fresh fruits and vegetables. Limit foods that are high in fat and processed sugars, such as fried or sweet foods. Activity Exercise only as directed by your health care provider. Most people can continue their usual exercise routine during pregnancy. Try to exercise for 30 minutes at least 5 days a week. Stop exercising if you develop pain or cramping in the lower abdomen or lower back. Avoid exercising if it is very hot or humid or if you are at high altitude. Avoid heavy lifting. If you choose to, you may have sex unless your health care provider tells you not to. Relieving pain and discomfort Wear a good support bra to relieve breast tenderness. Rest with your legs elevated if you have leg cramps or  low back pain. If you develop bulging veins  (varicose veins) in your legs: Wear support hose as told by your health care provider. Elevate your feet for 15 minutes, 3-4 times a day. Limit salt in your diet. Safety Wear your seat belt at all times when driving or riding in a car. Talk with your health care provider if someone is verbally or physically abusive to you. Talk with your health care provider if you are feeling sad or have thoughts of hurting yourself. Lifestyle Do not use hot tubs, steam rooms, or saunas. Do not douche. Do not use tampons or scented sanitary pads. Do not use herbal remedies, alcohol, illegal drugs, or medicines that are not approved by your health care provider. Chemicals in these products can harm your baby. Do not use any products that contain nicotine or tobacco, such as cigarettes, e-cigarettes, and chewing tobacco. If you need help quitting, ask your health care provider. Avoid cat litter boxes and soil used by cats. These carry germs that can cause birth defects in the baby and possibly loss of the unborn baby (fetus) by miscarriage or stillbirth. General instructions During routine prenatal visits in the first trimester, your health care provider will do a physical exam, perform necessary tests, and ask you how things are going. Keep all follow-up visits. This is important. Ask for help if you have counseling or nutritional needs during pregnancy. Your health care provider can offer advice or refer you to specialists for help with various needs. Schedule a dentist appointment. At home, brush your teeth with a soft toothbrush. Floss gently. Write down your questions. Take them to your prenatal visits. Where to find more information American Pregnancy Association: americanpregnancy.[place] and Gynecologists: PoolDevices.com.pt Office on [employer]: KeywordPortfolios.com.br Contact a health care provider if you have: Dizziness. A fever. Mild pelvic  cramps, pelvic pressure, or nagging pain in the abdominal area. Nausea, vomiting, or diarrhea that lasts for 24 hours or longer. A bad-smelling vaginal discharge. Pain when you urinate. Known exposure to a contagious illness, such as chickenpox, measles, Zika virus, HIV, or hepatitis. Get help right away if you have: Spotting or bleeding from your vagina. Severe abdominal cramping or pain. Shortness of breath or chest pain. Any kind of trauma, such as from a fall or a car crash. New or increased pain, swelling, or redness in an arm or leg. Summary The first trimester of pregnancy starts on the first day of your last menstrual period until the end of week 12 (months 1 through 3). Eating 4 or 5 small meals a day rather than 3 large meals may help to relieve nausea and vomiting. Do not use any products that contain nicotine or tobacco, such as cigarettes, e-cigarettes, and chewing tobacco. If you need help quitting, ask your health care provider. Keep all follow-up visits. This is important. This information is not intended to replace advice given to you by your health care provider. Make sure you discuss any questions you have with your health care provider. Document Revised: 02/06/2020 Document Reviewed: 12/13/2019 Elsevier Patient Education  2022 [employer].

## 2021-09-26 ENCOUNTER — Encounter: Payer: Self-pay | Admitting: Family Medicine

## 2021-09-26 DIAGNOSIS — O289 Unspecified abnormal findings on antenatal screening of mother: Secondary | ICD-10-CM | POA: Insufficient documentation

## 2021-09-28 LAB — CERVICOVAGINAL ANCILLARY ONLY
Chlamydia: NEGATIVE
Comment: NEGATIVE
Comment: NORMAL
Neisseria Gonorrhea: NEGATIVE

## 2021-10-12 ENCOUNTER — Inpatient Hospital Stay (HOSPITAL_COMMUNITY): Payer: Medicaid Other

## 2021-10-12 ENCOUNTER — Encounter (HOSPITAL_COMMUNITY): Payer: Self-pay | Admitting: Family Medicine

## 2021-10-12 ENCOUNTER — Telehealth: Payer: Self-pay

## 2021-10-12 ENCOUNTER — Inpatient Hospital Stay (HOSPITAL_COMMUNITY)
Admission: AD | Admit: 2021-10-12 | Discharge: 2021-10-12 | Disposition: A | Discharge status: Home / Self Care | Payer: Medicaid Other | Attending: Family Medicine | Admitting: Family Medicine

## 2021-10-12 ENCOUNTER — Inpatient Hospital Stay: Admission: RE | Admit: 2021-10-12 | Payer: Medicaid Other | Source: Ambulatory Visit

## 2021-10-12 ENCOUNTER — Other Ambulatory Visit: Payer: Self-pay

## 2021-10-12 DIAGNOSIS — Z3A09 9 weeks gestation of pregnancy: Secondary | ICD-10-CM | POA: Diagnosis not present

## 2021-10-12 DIAGNOSIS — K5900 Constipation, unspecified: Secondary | ICD-10-CM | POA: Diagnosis not present

## 2021-10-12 DIAGNOSIS — O2241 Hemorrhoids in pregnancy, first trimester: Secondary | ICD-10-CM | POA: Insufficient documentation

## 2021-10-12 DIAGNOSIS — O26891 Other specified pregnancy related conditions, first trimester: Secondary | ICD-10-CM | POA: Diagnosis present

## 2021-10-12 DIAGNOSIS — O99611 Diseases of the digestive system complicating pregnancy, first trimester: Secondary | ICD-10-CM | POA: Diagnosis not present

## 2021-10-12 DIAGNOSIS — O26851 Spotting complicating pregnancy, first trimester: Secondary | ICD-10-CM | POA: Diagnosis not present

## 2021-10-12 DIAGNOSIS — O209 Hemorrhage in early pregnancy, unspecified: Secondary | ICD-10-CM | POA: Diagnosis not present

## 2021-10-12 DIAGNOSIS — R109 Unspecified abdominal pain: Secondary | ICD-10-CM | POA: Insufficient documentation

## 2021-10-12 HISTORY — DX: Other specified health status: Z78.9

## 2021-10-12 LAB — URINALYSIS, ROUTINE W REFLEX MICROSCOPIC
Bilirubin Urine: NEGATIVE
Glucose, UA: NEGATIVE mg/dL
Ketones, ur: NEGATIVE mg/dL
Leukocytes,Ua: NEGATIVE
Nitrite: NEGATIVE
Protein, ur: NEGATIVE mg/dL
Specific Gravity, Urine: 1.015 (ref 1.005–1.030)
pH: 7.5 (ref 5.0–8.0)

## 2021-10-12 LAB — CBC
HCT: 36.6 % (ref 36.0–46.0)
Hemoglobin: 12.2 g/dL (ref 12.0–15.0)
MCH: 29.8 pg (ref 26.0–34.0)
MCHC: 33.3 g/dL (ref 30.0–36.0)
MCV: 89.5 fL (ref 80.0–100.0)
Platelets: 355 10*3/uL (ref 150–400)
RBC: 4.09 MIL/uL (ref 3.87–5.11)
RDW: 12.5 % (ref 11.5–15.5)
WBC: 6.4 10*3/uL (ref 4.0–10.5)
nRBC: 0 % (ref 0.0–0.2)

## 2021-10-12 LAB — URINALYSIS, MICROSCOPIC (REFLEX): Bacteria, UA: NONE SEEN

## 2021-10-12 LAB — WET PREP, GENITAL
Clue Cells Wet Prep HPF POC: NONE SEEN
Sperm: NONE SEEN
Trich, Wet Prep: NONE SEEN
WBC, Wet Prep HPF POC: >=10 — AB (ref <10)
Yeast Wet Prep HPF POC: NONE SEEN

## 2021-10-12 LAB — HCG, QUANTITATIVE, PREGNANCY: hCG, Beta Chain, Quant, S: 143525 m[IU]/mL — ABNORMAL HIGH (ref <5)

## 2021-10-12 NOTE — MAU Note (Signed)
Rhonda Dixon is a 21 y.o. at [redacted]w[redacted]d here in MAU reporting: this morning went to the bathroom and had some cramping. Noted some bleeding on the toilet paper with a small clot. Has not checked for more bleeding. States when she wiped the 2nd time the bleeding was gone. Last IC was 2 days ago. Having some left upper abdominal pain.  Onset of complaint: today  Pain score: 2/10  Vitals:   10/12/21 1303  BP: 117/76  Pulse: 90  Resp: 16  Temp: 98.2 F (36.8 C)  SpO2: 100%     RFX:[id number], unable to obtain FHT  Lab orders placed from triage: [facility]

## 2021-10-12 NOTE — Discharge Instructions (Signed)
Safe Medications in Pregnancy    Acne: Benzoyl Peroxide Salicylic Acid  Backache/Headache: Tylenol: 2 regular strength every 4 hours OR              2 Extra strength every 6 hours  Colds/Coughs/Allergies: Benadryl (alcohol free) 25 mg every 6 hours as needed Breath right strips Claritin Cepacol throat lozenges Chloraseptic throat spray Cold-Eeze- up to three times per day Cough drops, alcohol free Flonase (by prescription only) Guaifenesin Mucinex Robitussin DM (plain only, alcohol free) Saline nasal spray/drops Sudafed (pseudoephedrine) & Actifed ** use only after [redacted] weeks gestation and if you do not have high blood pressure Tylenol Vicks Vaporub Zinc lozenges Zyrtec   Constipation: Colace Ducolax suppositories Fleet enema Glycerin suppositories Metamucil Milk of magnesia Miralax Senokot Smooth move tea  Diarrhea: Kaopectate Imodium A-D  *NO pepto Bismol  Hemorrhoids: Anusol Anusol HC Preparation H Tucks  Indigestion: Tums Maalox Mylanta Zantac  Pepcid  Insomnia: Benadryl (alcohol free) 25mg  every 6 hours as needed Tylenol PM Unisom, no Gelcaps  Leg Cramps: Tums MagGel  Nausea/Vomiting:  Bonine Dramamine Emetrol Ginger extract Sea bands Meclizine  Nausea medication to take during pregnancy:  Unisom (doxylamine succinate 25 mg tablets) Take one tablet daily at bedtime. If symptoms are not adequately controlled, the dose can be increased to a maximum recommended dose of two tablets daily (1/2 tablet in the morning, 1/2 tablet mid-afternoon and one at bedtime). Vitamin B6 100mg  tablets. Take one tablet twice a day (up to 200 mg per day).  Skin Rashes: Aveeno products Benadryl cream or 25mg  every 6 hours as needed Calamine Lotion 1% cortisone cream  Yeast infection: Gyne-lotrimin 7 Monistat 7   **If taking multiple medications, please check labels to avoid duplicating the same active ingredients **take  medication as directed on the label ** Do not exceed 4000 mg of tylenol in 24 hours **Do not take medications that contain aspirin or ibuprofen  Miralax Cleanout  You have constipation which is hard stools that are difficult to pass. It is important to have regular bowel movements every 1-3 days that are soft and easy to pass. Hard stools increase your risk of hemorrhoids and are very uncomfortable.   To prevent constipation you can increase the amount of fiber in your diet. Examples of foods with fiber are leafy greens, whole grain breads, oatmeal and other grains.  It is also important to drink at least eight 8oz glass of water everyday.   If you have not has a bowel movement in 4-5 days you made need to clean out your bowel.  This will have establish normal movement through your bowel.    Miralax Clean out Take 8 capfuls of miralax in 64 oz of gatorade. You can use any fluid that appeals to you (gatorade, water, juice) Continue to drink at least eight 8 oz glasses of water throughout the day You can repeat with another 8 capfuls of miralax in 64 oz of gatorade if you are not having a large amount of stools You will need to be at home and close to a bathroom for about 8 hours when you do the above as you may need to go to the bathroom frequently.   After you are cleaned out: - Start Colace100mg  twice daily - Start Miralax once daily - Start a daily fiber supplement like metamucil or citrucel - You can safely use enemas in pregnancy  - if you are having diarrhea you can reduce to Colace once a day or miralax every  other day or a 1/2 capful daily.

## 2021-10-12 NOTE — MAU Provider Note (Signed)
History     CSN: [id number]  Arrival date and time: 10/12/21 1247   None     Chief Complaint  Patient presents with   Abdominal Pain   Vaginal Bleeding   HPI Rhonda Dixon is a 21 y.o. G1P0 at [redacted]w[redacted]d by LMP who presents to MAU for vaginal bleeding. She reports that she went to the bathroom this morning and had a BM. While wiping she noticed a lot of bright red blood in the toilet as well as on the toilet paper. She also noticed a small blood clot. She reports that this has happened once before. She is unsure if the bleeding was rectal or vaginal. She reports some constipation and abdominal pain. Denies hemorrhoids. She is also concerned about a possible yeast infection because she has noticed an odor, however denies itching or discharge. No urinary s/s, fever, or other concerning symptoms. LMP approximately 07/28/2021. Gets [facility].  OB History     Gravida  1   Para      Term      Preterm      AB      Living         SAB      IAB      Ectopic      Multiple      Live Births              Past Medical History:  Diagnosis Date   Medical history non-contributory     Past Surgical History:  Procedure Laterality Date   NO PAST SURGERIES      Family History  Problem Relation Age of Onset   Lupus Mother     Social History   Tobacco Use   Smoking status: Never   Smokeless tobacco: Never  Substance Use Topics   Alcohol use: Not Currently    Comment: occ   Drug use: No    Allergies: No Known Allergies  No medications prior to admission.    Review of Systems  Constitutional: Negative.   Respiratory: Negative.    Cardiovascular: Negative.   Gastrointestinal:  Positive for abdominal pain and constipation.  Genitourinary:  Positive for vaginal bleeding. Negative for dysuria and pelvic pain.  Neurological: Negative.    Physical Exam   Blood pressure 117/76, pulse 90, temperature 98.2 F (36.8 C), temperature source  Oral, resp. rate 16, height 5\' 4"  (1.626 m), weight 87.2 kg, last menstrual period 07/28/2021, SpO2 100 %.  Physical Exam Vitals and nursing note reviewed. Exam conducted with a chaperone present.  Constitutional:      General: She is not in acute distress.    Appearance: She is obese.  Cardiovascular:     Rate and Rhythm: Normal rate.  Pulmonary:     Effort: Pulmonary effort is normal.  Abdominal:     Palpations: Abdomen is soft.     Tenderness: There is no abdominal tenderness. There is no guarding.  Genitourinary:    Rectum: External hemorrhoid present.     Comments: NEFG, vaginal walls pink with rugae, no bleeding, small amount of thick white discharge, cervix appears visually closed without lesions/masses Skin:    General: Skin is warm and dry.  Neurological:     General: No focal deficit present.     Mental Status: She is alert and oriented to person, place, and time.  Psychiatric:        Mood and Affect: Mood normal.        Behavior:  Behavior normal.   US OB Comp Less 14 Wks  Result Date: 10/12/2021 CLINICAL DATA:  Vaginal bleeding and cramps EXAM: OBSTETRIC <14 WK [place] AND TRANSVAGINAL OB US TECHNIQUE: Both transabdominal and transvaginal ultrasound examinations were performed for complete evaluation of the gestation as well as the maternal uterus, adnexal regions, and pelvic cul-de-sac. Transvaginal technique was performed to assess early pregnancy. COMPARISON:  None. FINDINGS: Intrauterine gestational sac: Single Yolk sac:  Visualized. Embryo:  Visualized. Cardiac Activity: Visualized. Heart Rate: 147  bpm CRL:  26.3 mm   9 w   3 d                  [place] EDC: 05/14/2022 Subchorionic hemorrhage:  None visualized. Maternal uterus/adnexae: Normal appearing right ovary. Left ovary is not visualized. No free fluid seen in the pelvis IMPRESSION: Single viable intrauterine pregnancy with estimated gestational age of [redacted] weeks 3 days. Electronically Signed   By: Allegra Lai M.D.   On:  10/12/2021 15:12    MAU Course  Procedures CBC, HCG Wet prep, GC/CT Ultrasound  MDM IUP present. No evidence of vaginal bleeding. Patient does have a small external hemorrhoid, likely the cause of bleeding.   Assessment and Plan  [redacted] weeks gestation of pregnancy Constipation affecting pregnancy Hemorrhoid affecting pregnancy  - Discharge home in stable condition - Recommend increase water and fiber intake. May use stool softeners, Miralax, Tuck's pads. List of safe meds in pregnancy provided.  - Strict return precautions reviewed. May return to MAU sooner or as needed.   Brand Males, CNM 10/12/2021, 3:38 PM

## 2021-10-12 NOTE — Telephone Encounter (Signed)
Patient calls nurse line reporting bleeding in early pregnancy. Patient reports she has been having "a lot" of abdominal cramping over the last few days and this morning noticed a "significant" amount of blood when she wiped. Patient reports the toilet bowl was full and a small clot was present on the tissue paper. Patient reports she has been throwing up most of the morning and has been continuously nauseated for last several days.  Patient advised to go MAU for evaluation.

## 2021-10-13 LAB — GC/CHLAMYDIA PROBE AMP (~~LOC~~) NOT AT ARMC
Chlamydia: NEGATIVE
Comment: NEGATIVE
Comment: NORMAL
Neisseria Gonorrhea: NEGATIVE

## 2021-10-20 NOTE — Progress Notes (Signed)
°  Patient Name: Rhonda Dixon Date of Birth: 09-28-2000 [place] Prenatal Visit  Rhonda Dixon is a 21 y.o. G1P0 at [redacted]w[redacted]d here for routine follow up. She is dated by early ultrasound.  She reports nausea and vomiting.  She denies vaginal bleeding.  See flow sheet for details.  Vitals:   10/21/21 1615  BP: 104/72  Pulse: 84     A/P: Pregnancy at [redacted]w[redacted]d.  Doing well.    Routine Prenatal Care:  Dating reviewed, dating tab is correct Fetal heart tones-not detected to early pregnancy , discussed with preceptor Influenza vaccine not administered as patient declined, will continue to discuss.   COVID vaccination was discussed and declined.  The patient has the following indication for screening preexisting diabetes: BMI > 25 and high risk ethnicity (Latino, [place] American, Native American, [place], Asian Optometrist) . Anatomy ultrasound needs to be ordered to be scheduled at 18-20 weeks. Patient is interested in genetic screening. Pt opted for panaroma and horizon. Does not want to know gender of baby yet.  Pregnancy education including expected weight gain in pregnancy, OTC medication use, continued use of prenatal vitamin, smoking cessation if applicable, and nutrition in pregnancy.   Bleeding and pain precautions reviewed. The patient has the following indications for aspirinto begin 81 mg at 12-16 weeks: One high risk condition: no single high risk condition  MORE than one moderate risk condition: nulliparity, low SES  , and identifies as African American  Aspirin was  recommended today based upon above risk factors (one high risk condition or more than one moderate risk factor)   2. Pregnancy issues include the following and were addressed as appropriate today: -Diclegis for nausea and vomiting.  -Starting ASA 81 at 12 weeks  -Pt will come in for genetic screening test (I will fill out paperwork) and early glucose test, unable to do this today due to lab  closure -Problem list and pregnancy box updated: Yes.   Follow up 4 weeks.

## 2021-10-21 ENCOUNTER — Other Ambulatory Visit: Payer: Self-pay

## 2021-10-21 ENCOUNTER — Ambulatory Visit (INDEPENDENT_AMBULATORY_CARE_PROVIDER_SITE_OTHER): Payer: Medicaid Other | Admitting: Family Medicine

## 2021-10-21 DIAGNOSIS — Z3481 Encounter for supervision of other normal pregnancy, first trimester: Secondary | ICD-10-CM

## 2021-10-21 MED ORDER — DOXYLAMINE-PYRIDOXINE 10-10 MG PO TBEC: 1.0000 | DELAYED_RELEASE_TABLET | Freq: Two times a day (BID) | ORAL | Status: DC

## 2021-10-21 MED ORDER — PRENATAL VITAMINS 28-0.8 MG PO TABS: 1.0000 | ORAL_TABLET | Freq: Every day | ORAL | 3 refills | Status: DC

## 2021-10-21 NOTE — Patient Instructions (Signed)
Thank you for coming to see me today. It was a pleasure. Today we discussed your symptoms. I recommend taking diclegis for the nausea and vomiting. Take prenatals daily.  Go to the [facility] at [facility] if: You have cramping/contractions that do not go away with drinking water You have vaginal bleeding.     Come back for the glucose test and genetic screening blood test as a lab visit   Please follow-up with me in 4 weeks   If you have any questions or concerns, please do not hesitate to call the office at [phone].  Best wishes,   Dr Allena Katz

## 2021-11-02 NOTE — Progress Notes (Addendum)
° ° °  SUBJECTIVE:   CHIEF COMPLAINT / HPI:  Chief Complaint  Patient presents with   Covid Vaccine     Here for 1st Covid vaccine. 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 No history of adverse reactions to vaccines.  [redacted]w[redacted]d pregnant  Is to start on ASA at 12 weeks. Needs early GTT, genetic screening test - declined today and will plan to get at her next prenatal visit. She has to go to work soon so does not have time today.  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  PERTINENT  PMH / PSH: current pregnancy  Patient Care Team: Levin Erp, MD as PCP - General (Family Medicine)   OBJECTIVE:   BP 117/78    Pulse 98    Ht 5\' 4"  (1.626 m)    Wt 191 lb 9.6 oz (86.9 kg)    LMP 07/28/2021 (Approximate)    SpO2 100%    BMI 32.89 kg/m   Physical Exam Constitutional:      General: She is not in acute distress.    Appearance: Normal appearance. She is obese.  Eyes:     Extraocular Movements: Extraocular movements intact.  Cardiovascular:     Rate and Rhythm: Normal rate.  Pulmonary:     Effort: Pulmonary effort is normal. No respiratory distress.  Musculoskeletal:     Cervical back: Neck supple.  Neurological:     Mental Status: She is alert.     Depression screen [facility] 2/9 11/03/2021  Decreased Interest 0  Down, Depressed, Hopeless 0  PHQ - 2 Score 0  Altered sleeping 1  Tired, decreased energy 2  Change in appetite 1  Feeling bad or failure about yourself  0  Trouble concentrating 0  Moving slowly or fidgety/restless 0  Suicidal thoughts 0  PHQ-9 Score 4  Difficult doing work/chores Not difficult at all  Some recent data might be hidden     {Show previous vital signs (optional):23777}    ASSESSMENT/PLAN:   Encounter for Covid-19 vaccination First dose given today without incident, scheduled for second dose.  Supervision of normal pregnancy Needs 1h GTT and repeat antibody screen at next prenatal visit. Unable to  collect today due to time constraints.    Return in about 2 weeks (around 11/17/2021) for f/u OB.   01/17/2022, MD [facility]

## 2021-11-02 NOTE — Patient Instructions (Addendum)
It was nice seeing you today!  Next prenatal visit in 2 weeks. You will need glucose test and other blood tests at that visit.  Make sure to start taking aspirin once a day to prevent pre-eclampsia.  Stay well, Rhonda Button, MD [facility] [phone]  --  Make sure to check out at the front desk before you leave today.  Please arrive at least 15 minutes prior to your scheduled appointments.  If you had blood work today, I will send you a MyChart message or a letter if results are normal. Otherwise, I will give you a call.  If you had a referral placed, they will call you to set up an appointment. Please give [place] a call if you don't hear back in the next 2 weeks.  If you need additional refills before your next appointment, please call your pharmacy first.

## 2021-11-03 ENCOUNTER — Other Ambulatory Visit: Payer: Self-pay

## 2021-11-03 ENCOUNTER — Encounter: Payer: Self-pay | Admitting: Family Medicine

## 2021-11-03 ENCOUNTER — Ambulatory Visit (INDEPENDENT_AMBULATORY_CARE_PROVIDER_SITE_OTHER): Payer: Medicaid Other | Admitting: Family Medicine

## 2021-11-03 ENCOUNTER — Ambulatory Visit (INDEPENDENT_AMBULATORY_CARE_PROVIDER_SITE_OTHER): Payer: Medicaid Other

## 2021-11-03 VITALS — BP 117/78 | HR 98 | Ht 64.0 in | Wt 191.6 lb

## 2021-11-03 DIAGNOSIS — Z3481 Encounter for supervision of other normal pregnancy, first trimester: Secondary | ICD-10-CM | POA: Diagnosis present

## 2021-11-03 DIAGNOSIS — Z23 Encounter for immunization: Secondary | ICD-10-CM

## 2021-11-03 NOTE — Assessment & Plan Note (Signed)
Needs 1h GTT and repeat antibody screen at next prenatal visit. Unable to collect today due to time constraints.

## 2021-11-16 ENCOUNTER — Ambulatory Visit (INDEPENDENT_AMBULATORY_CARE_PROVIDER_SITE_OTHER): Payer: Medicaid Other | Admitting: Family Medicine

## 2021-11-16 ENCOUNTER — Other Ambulatory Visit: Payer: Self-pay

## 2021-11-16 VITALS — BP 105/60 | HR 95 | Wt 192.6 lb

## 2021-11-16 DIAGNOSIS — Z3480 Encounter for supervision of other normal pregnancy, unspecified trimester: Secondary | ICD-10-CM

## 2021-11-16 MED ORDER — ASPIRIN EC 81 MG PO TBEC: 81.0000 mg | DELAYED_RELEASE_TABLET | Freq: Every day | ORAL | 11 refills | Status: DC

## 2021-11-16 NOTE — Patient Instructions (Addendum)
We started your aspirin 81 mg today.  We have you scheduled for follow-up later this week for your 1 hour sugar test as well as your genetic screening. ? ?Second Trimester of Pregnancy ?The second trimester of pregnancy is from week 13 through week 27. This is also called months 4 through 6 of pregnancy. This is often the time when you feel your best. ?During the second trimester: ?Morning sickness is less or has stopped. ?You may have more energy. ?You may feel hungry more often. ?At this time, your unborn baby (fetus) is growing very fast. At the end of the sixth month, the unborn baby may be up to 12 inches long and weigh about 1? pounds. You will likely start to feel the baby move between 16 and 20 weeks of pregnancy. ?Body changes during your second trimester ?Your body continues to go through many changes during this time. The changes vary and generally return to normal after the baby is born. ?Physical changes ?You will gain more weight. ?You may start to get stretch marks on your hips, belly (abdomen), and breasts. ?Your breasts will grow and may hurt. ?Dark spots or blotches may develop on your face. ?A dark line from your belly button to the pubic area (linea nigra) may appear. ?You may have changes in your hair. ?Health changes ?You may have headaches. ?You may have heartburn. ?You may have trouble pooping (constipation). ?You may have hemorrhoids or swollen, bulging veins (varicose veins). ?Your gums may bleed. ?You may pee (urinate) more often. ?You may have back pain. ?Follow these instructions at home: ?Medicines ?Take over-the-counter and prescription medicines only as told by your doctor. Some medicines are not safe during pregnancy. ?Take a prenatal vitamin that contains at least 600 micrograms (mcg) of folic acid. ?Eating and drinking ?Eat healthy meals that include: ?Fresh fruits and vegetables. ?Whole grains. ?Good sources of protein, such as meat, eggs, or tofu. ?Low-fat dairy products. ?Avoid  raw meat and unpasteurized juice, milk, and cheese. ?You may need to take these actions to prevent or treat trouble pooping: ?Drink enough fluids to keep your pee (urine) pale yellow. ?Eat foods that are high in fiber. These include beans, whole grains, and fresh fruits and vegetables. ?Limit foods that are high in fat and sugar. These include fried or sweet foods. ?Activity ?Exercise only as told by your doctor. Most people can do their usual exercise during pregnancy. Try to exercise for 30 minutes at least 5 days a week. ?Stop exercising if you have pain or cramps in your belly or lower back. ?Do not exercise if it is too hot or too humid, or if you are in a place of great height (high altitude). ?Avoid heavy lifting. ?If you choose to, you may have sex unless your doctor tells you not to. ?Relieving pain and discomfort ?Wear a good support bra if your breasts are sore. ?Take warm water baths (sitz baths) to soothe pain or discomfort caused by hemorrhoids. Use hemorrhoid cream if your doctor approves. ?Rest with your legs raised (elevated) if you have leg cramps or low back pain. ?If you develop bulging veins in your legs: ?Wear support hose as told by your doctor. ?Raise your feet for 15 minutes, 3-4 times a day. ?Limit salt in your food. ?Safety ?Wear your seat belt at all times when you are in a car. ?Talk with your doctor if someone is hurting you or yelling at you a lot. ?Lifestyle ?Do not use hot tubs, steam rooms, or  saunas. ?Do not douche. Do not use tampons or scented sanitary pads. ?Avoid cat litter boxes and soil used by cats. These carry germs that can harm your baby and can cause a loss of your baby by miscarriage or stillbirth. ?Do not use herbal medicines, illegal drugs, or medicines that are not approved by your doctor. Do not drink alcohol. ?Do not smoke or use any products that contain nicotine or tobacco. If you need help quitting, ask your doctor. ?General instructions ?Keep all follow-up  visits. This is important. ?Ask your doctor about local prenatal classes. ?Ask your doctor about the right foods to eat or for help finding a counselor. ?Where to find more information ?American Pregnancy Association: americanpregnancy.org ?[employer]st[employer]nec[employer]: www.acog.org ?Office on Women's Health: MightyReward.co.nz ?Contact a doctor if: ?You have a headache that does not go away when you take medicine. ?You have changes in how you see, or you see spots in front of your eyes. ?You have mild cramps, pressure, or pain in your lower belly. ?You continue to feel like you may vomit (nauseous), you vomit, or you have watery poop (diarrhea). ?You have bad-smelling fluid coming from your vagina. ?You have pain when you pee or your pee smells bad. ?You have very bad swelling of your face, hands, ankles, feet, or legs. ?You have a fever. ?Get help right away if: ?You are leaking fluid from your vagina. ?You have spotting or bleeding from your vagina. ?You have very bad belly cramping or pain. ?You have trouble breathing. ?You have chest pain. ?You faint. ?You have not felt your baby move for the time period told by your doctor. ?You have new or increased pain, swelling, or redness in an arm or leg. ?Summary ?The second trimester of pregnancy is from week 13 through week 27 (months 4 through 6). ?Eat healthy meals. ?Exercise as told by your doctor. Most people can do their usual exercise during pregnancy. ?Do not use herbal medicines, illegal drugs, or medicines that are not approved by your doctor. Do not drink alcohol. ?Call your doctor if you get sick or if you notice anything unusual about your pregnancy. ?This information is not intended to replace advice given to you by your health care provider. Make sure you discuss any questions you have with your health care provider. ?Document Revised: 02/06/2020 Document Reviewed: 12/13/2019 ?Elsevier Patient Education ? 2022 Elsevier  Inc. ? ?

## 2021-11-16 NOTE — Progress Notes (Signed)
?  Patient Name: [name] ?Date of Birth: 2000-11-11 ?[facility] Prenatal Visit ? ?Rhonda Dixon is a 21 y.o. G1P0 at [redacted]w[redacted]d here for routine follow up. She is dated by early ultrasound.  She reports  some abdominal cramping .  She denies vaginal bleeding.  She has been having some cramping in her lower abdomen about one time per week it seems to happen after periods of stress or after eating some foods. The seem to feel like "period cramps". See flow sheet for details. ? ?Vitals:  ? 11/16/21 1138  ?BP: 105/60  ?Pulse: 95  ? ? ? ?A/P: Pregnancy at [redacted]w[redacted]d.  Doing well.   ? ?Routine Prenatal Care:  ?Dating reviewed, dating tab is correct ?Fetal heart tones Appropriate ?Influenza vaccine not administered as patient declined, will continue to discuss.   ?COVID vaccination was discussed and she has scheduled follow-up for repeat.  ?The patient has the following indication for screening preexisting diabetes: 1 hour GTT scheduled for this week ?Anatomy ultrasound ordered to be scheduled at 18-20 weeks. ?Patient is interested in genetic screening.  Panorama form was filled out below after discussion with the patient. ?Pregnancy education including expected weight gain in pregnancy, OTC medication use, continued use of prenatal vitamin, smoking cessation if applicable, and nutrition in pregnancy.   ?Bleeding and pain precautions reviewed. ?The patient has the following indications for aspirinto begin 81 mg at 12-16 weeks: ?One high risk condition: no single high risk condition  ?MORE than one moderate risk condition: nulliparity and identifies as African American  ?Aspirin was  recommended today based upon above risk factors (one high risk condition or more than one moderate risk factor)  ? ?2. Pregnancy issues include the following and were addressed as appropriate today: ? ?Precepted case with Dr. Manson Passey ?ASA started today. ?Hour GGT was recommended but due to lab unavailability.  Discussed with the  patient and she would like to have this done this week.  Her schedule last for Thursday or Friday so we will schedule a lab only visit at that time. ?She also wants the panorama performed and will have that drawn on Thursday or Friday when she returns for the 1 hour GGT. ?Problem list  and pregnancy box updated: Yes.  ? ?Follow up 4 weeks. ?  ?

## 2021-11-20 ENCOUNTER — Other Ambulatory Visit: Payer: Self-pay

## 2021-11-20 ENCOUNTER — Other Ambulatory Visit (INDEPENDENT_AMBULATORY_CARE_PROVIDER_SITE_OTHER): Payer: Medicaid Other

## 2021-11-20 DIAGNOSIS — Z31430 Encounter of female for testing for genetic disease carrier status for procreative management: Secondary | ICD-10-CM | POA: Diagnosis not present

## 2021-11-20 DIAGNOSIS — Z3480 Encounter for supervision of other normal pregnancy, unspecified trimester: Secondary | ICD-10-CM | POA: Diagnosis present

## 2021-11-20 LAB — POCT 1 HR PRENATAL GLUCOSE: Glucose 1 Hr Prenatal, POC: 72 mg/dL

## 2021-11-20 NOTE — Progress Notes (Signed)
Panorama collected and sent out today. Rhonda Dixon Rhonda Dixon ? ?

## 2021-11-24 ENCOUNTER — Other Ambulatory Visit: Payer: Self-pay

## 2021-11-24 ENCOUNTER — Ambulatory Visit (INDEPENDENT_AMBULATORY_CARE_PROVIDER_SITE_OTHER): Payer: Medicaid Other | Admitting: *Deleted

## 2021-11-24 ENCOUNTER — Ambulatory Visit: Payer: Medicaid Other

## 2021-11-24 DIAGNOSIS — Z23 Encounter for immunization: Secondary | ICD-10-CM | POA: Diagnosis not present

## 2021-12-01 ENCOUNTER — Telehealth: Payer: Self-pay | Admitting: *Deleted

## 2021-12-01 NOTE — Telephone Encounter (Signed)
Patient states that she is waiting on the results of her genetic testing. She is having a gender reveal party in 2 weeks.  Will forward to MD and lab. Jone Baseman, CMA ? ?

## 2021-12-03 ENCOUNTER — Encounter: Payer: Self-pay | Admitting: Family Medicine

## 2021-12-11 ENCOUNTER — Encounter: Payer: Self-pay | Admitting: Family Medicine

## 2021-12-24 ENCOUNTER — Ambulatory Visit (INDEPENDENT_AMBULATORY_CARE_PROVIDER_SITE_OTHER): Payer: Medicaid Other | Admitting: Family Medicine

## 2021-12-24 ENCOUNTER — Other Ambulatory Visit: Payer: Self-pay

## 2021-12-24 ENCOUNTER — Encounter: Payer: Self-pay | Admitting: Family Medicine

## 2021-12-24 VITALS — BP 113/74 | HR 94 | Wt 196.4 lb

## 2021-12-24 DIAGNOSIS — Z3481 Encounter for supervision of other normal pregnancy, first trimester: Secondary | ICD-10-CM

## 2021-12-24 NOTE — Patient Instructions (Signed)
Second Trimester of Pregnancy °The second trimester of pregnancy is from week 13 through week 27. This is also called months 4 through 6 of pregnancy. This is often the time when you feel your best. °During the second trimester: °Morning sickness is less or has stopped. °You may have more energy. °You may feel hungry more often. °At this time, your unborn baby (fetus) is growing very fast. At the end of the sixth month, the unborn baby may be up to 12 inches long and weigh about 1½ pounds. You will likely start to feel the baby move between 16 and 20 weeks of pregnancy. °Body changes during your second trimester °Your body continues to go through many changes during this time. The changes vary and generally return to normal after the baby is born. °Physical changes °You will gain more weight. °You may start to get stretch marks on your hips, belly (abdomen), and breasts. °Your breasts will grow and may hurt. °Dark spots or blotches may develop on your face. °A dark line from your belly button to the pubic area (linea nigra) may appear. °You may have changes in your hair. °Health changes °You may have headaches. °You may have heartburn. °You may have trouble pooping (constipation). °You may have hemorrhoids or swollen, bulging veins (varicose veins). °Your gums may bleed. °You may pee (urinate) more often. °You may have back pain. °Follow these instructions at home: °Medicines °Take over-the-counter and prescription medicines only as told by your doctor. Some medicines are not safe during pregnancy. °Take a prenatal vitamin that contains at least 600 micrograms (mcg) of folic acid. °Eating and drinking °Eat healthy meals that include: °Fresh fruits and vegetables. °Whole grains. °Good sources of protein, such as meat, eggs, or tofu. °Low-fat dairy products. °Avoid raw meat and unpasteurized juice, milk, and cheese. °You may need to take these actions to prevent or treat trouble pooping: °Drink enough fluids to keep  your pee (urine) pale yellow. °Eat foods that are high in fiber. These include beans, whole grains, and fresh fruits and vegetables. °Limit foods that are high in fat and sugar. These include fried or sweet foods. °Activity °Exercise only as told by your doctor. Most people can do their usual exercise during pregnancy. Try to exercise for 30 minutes at least 5 days a week. °Stop exercising if you have pain or cramps in your belly or lower back. °Do not exercise if it is too hot or too humid, or if you are in a place of great height (high altitude). °Avoid heavy lifting. °If you choose to, you may have sex unless your doctor tells you not to. °Relieving pain and discomfort °Wear a good support bra if your breasts are sore. °Take warm water baths (sitz baths) to soothe pain or discomfort caused by hemorrhoids. Use hemorrhoid cream if your doctor approves. °Rest with your legs raised (elevated) if you have leg cramps or low back pain. °If you develop bulging veins in your legs: °Wear support hose as told by your doctor. °Raise your feet for 15 minutes, 3-4 times a day. °Limit salt in your food. °Safety °Wear your seat belt at all times when you are in a car. °Talk with your doctor if someone is hurting you or yelling at you a lot. °Lifestyle °Do not use hot tubs, steam rooms, or saunas. °Do not douche. Do not use tampons or scented sanitary pads. °Avoid cat litter boxes and soil used by cats. These carry germs that can harm your baby and   can cause a loss of your baby by miscarriage or stillbirth. °Do not use herbal medicines, illegal drugs, or medicines that are not approved by your doctor. Do not drink alcohol. °Do not smoke or use any products that contain nicotine or tobacco. If you need help quitting, ask your doctor. °General instructions °Keep all follow-up visits. This is important. °Ask your doctor about local prenatal classes. °Ask your doctor about the right foods to eat or for help finding a  counselor. °Where to find more information °American Pregnancy Association: americanpregnancy.org °American College of Obstetricians and Gynecologists: www.acog.org °Office on Women's Health: womenshealth.gov/pregnancy °Contact a doctor if: °You have a headache that does not go away when you take medicine. °You have changes in how you see, or you see spots in front of your eyes. °You have mild cramps, pressure, or pain in your lower belly. °You continue to feel like you may vomit (nauseous), you vomit, or you have watery poop (diarrhea). °You have bad-smelling fluid coming from your vagina. °You have pain when you pee or your pee smells bad. °You have very bad swelling of your face, hands, ankles, feet, or legs. °You have a fever. °Get help right away if: °You are leaking fluid from your vagina. °You have spotting or bleeding from your vagina. °You have very bad belly cramping or pain. °You have trouble breathing. °You have chest pain. °You faint. °You have not felt your baby move for the time period told by your doctor. °You have new or increased pain, swelling, or redness in an arm or leg. °Summary °The second trimester of pregnancy is from week 13 through week 27 (months 4 through 6). °Eat healthy meals. °Exercise as told by your doctor. Most people can do their usual exercise during pregnancy. °Do not use herbal medicines, illegal drugs, or medicines that are not approved by your doctor. Do not drink alcohol. °Call your doctor if you get sick or if you notice anything unusual about your pregnancy. °This information is not intended to replace advice given to you by your health care provider. Make sure you discuss any questions you have with your health care provider. °Document Revised: 02/06/2020 Document Reviewed: 12/13/2019 °Elsevier Patient Education © 2022 Elsevier Inc. ° °

## 2021-12-24 NOTE — Progress Notes (Signed)
?  Patient Name: DOYNE ELLINGER ?Date of Birth: February 12, 2001 ?[facility] Prenatal Visit ? ?Cliffie Gingras Augenstein is a 21 y.o. G1P0 at [redacted]w[redacted]d here for routine follow up. She is dated by early ultrasound.  She reports  cramping mostly when moving around a lot at work. Noticing heartburn after eating hot or spicy foods .  She denies vaginal bleeding.  See flow sheet for details. ? ?Vitals:  ? 12/24/21 1333  ?BP: 113/74  ?Pulse: 94  ?SpO2: 100%  ? ? ? ?A/P: Pregnancy at [redacted]w[redacted]d.  Doing well.   ? ?Routine Prenatal Care:  ?Dating reviewed, dating tab is correct ?Fetal heart tones Appropriate HR 140 bmp ?Influenza vaccine not administered as not influenza season.   ?COVID vaccination was not discussed; address at follow up. ?The patient has the following indication for screening preexisting diabetes: previously done and normal.  ?Anatomy ultrasound ordered to be scheduled at 18-20 weeks. ?Pregnancy education including OTC medication use, starting ASA 81 mg, continued use of prenatal vitamin, and nutrition in pregnancy.   ?Bleeding and pain precautions reviewed. ?Antibody screen repeated ? ?2. Pregnancy issues include the following and were addressed as appropriate today: ? ?Cramping- likely braxton hicks contraction given infrequency and with increased activity. [place] scheduled.  ?Heartburn- advised to decrease hot and spicy foods and eating frequent small meals ?Problem list  and pregnancy box updated: Yes.  ? ?Follow up 4 weeks. ? ? ?

## 2021-12-25 LAB — ANTIBODY SCREEN: Antibody Screen: NEGATIVE

## 2021-12-29 ENCOUNTER — Other Ambulatory Visit: Payer: Self-pay

## 2021-12-29 ENCOUNTER — Inpatient Hospital Stay (HOSPITAL_COMMUNITY)
Admission: AD | Admit: 2021-12-29 | Discharge: 2021-12-29 | Discharge status: Against Medical Advice | Payer: Medicaid Other | Attending: Obstetrics and Gynecology | Admitting: Obstetrics and Gynecology

## 2021-12-29 ENCOUNTER — Inpatient Hospital Stay (HOSPITAL_BASED_OUTPATIENT_CLINIC_OR_DEPARTMENT_OTHER): Payer: Medicaid Other

## 2021-12-29 ENCOUNTER — Encounter (HOSPITAL_COMMUNITY): Payer: Self-pay | Admitting: Obstetrics and Gynecology

## 2021-12-29 DIAGNOSIS — R109 Unspecified abdominal pain: Secondary | ICD-10-CM

## 2021-12-29 DIAGNOSIS — Z3A20 20 weeks gestation of pregnancy: Secondary | ICD-10-CM | POA: Diagnosis not present

## 2021-12-29 DIAGNOSIS — O99891 Other specified diseases and conditions complicating pregnancy: Secondary | ICD-10-CM | POA: Diagnosis not present

## 2021-12-29 DIAGNOSIS — Z7982 Long term (current) use of aspirin: Secondary | ICD-10-CM | POA: Insufficient documentation

## 2021-12-29 DIAGNOSIS — O26892 Other specified pregnancy related conditions, second trimester: Secondary | ICD-10-CM | POA: Insufficient documentation

## 2021-12-29 DIAGNOSIS — R1031 Right lower quadrant pain: Secondary | ICD-10-CM | POA: Insufficient documentation

## 2021-12-29 DIAGNOSIS — N949 Unspecified condition associated with female genital organs and menstrual cycle: Secondary | ICD-10-CM | POA: Diagnosis not present

## 2021-12-29 LAB — URINALYSIS, ROUTINE W REFLEX MICROSCOPIC
Bilirubin Urine: NEGATIVE
Glucose, UA: NEGATIVE mg/dL
Hgb urine dipstick: NEGATIVE
Ketones, ur: 5 mg/dL — AB
Leukocytes,Ua: NEGATIVE
Nitrite: NEGATIVE
Protein, ur: NEGATIVE mg/dL
Specific Gravity, Urine: 1.023 (ref 1.005–1.030)
pH: 5 (ref 5.0–8.0)

## 2021-12-29 LAB — COMPREHENSIVE METABOLIC PANEL
ALT: 16 U/L (ref 0–44)
AST: 17 U/L (ref 15–41)
Albumin: 3.1 g/dL — ABNORMAL LOW (ref 3.5–5.0)
Alkaline Phosphatase: 39 U/L (ref 38–126)
Anion gap: 6 (ref 5–15)
BUN: <5 mg/dL — ABNORMAL LOW (ref 6–20)
CO2: 23 mmol/L (ref 22–32)
Calcium: 8.9 mg/dL (ref 8.9–10.3)
Chloride: 107 mmol/L (ref 98–111)
Creatinine, Ser: 0.66 mg/dL (ref 0.44–1.00)
GFR, Estimated: >60 mL/min (ref >60)
Glucose, Bld: 85 mg/dL (ref 70–99)
Potassium: 3.5 mmol/L (ref 3.5–5.1)
Sodium: 136 mmol/L (ref 135–145)
Total Bilirubin: 0.5 mg/dL (ref 0.3–1.2)
Total Protein: 6 g/dL — ABNORMAL LOW (ref 6.5–8.1)

## 2021-12-29 LAB — CBC
HCT: 34.2 % — ABNORMAL LOW (ref 36.0–46.0)
Hemoglobin: 11.6 g/dL — ABNORMAL LOW (ref 12.0–15.0)
MCH: 30.6 pg (ref 26.0–34.0)
MCHC: 33.9 g/dL (ref 30.0–36.0)
MCV: 90.2 fL (ref 80.0–100.0)
Platelets: 314 10*3/uL (ref 150–400)
RBC: 3.79 MIL/uL — ABNORMAL LOW (ref 3.87–5.11)
RDW: 13.2 % (ref 11.5–15.5)
WBC: 6.3 10*3/uL (ref 4.0–10.5)
nRBC: 0 % (ref 0.0–0.2)

## 2021-12-29 LAB — LIPASE, BLOOD: Lipase: 35 U/L (ref 11–51)

## 2021-12-29 LAB — AMYLASE: Amylase: 55 U/L (ref 28–100)

## 2021-12-29 MED ORDER — CYCLOBENZAPRINE HCL 5 MG PO TABS
10.0000 mg | ORAL_TABLET | Freq: Once | ORAL | Status: AC
  Administered 2021-12-29: 10 mg via ORAL
  Filled 2021-12-29: qty 2

## 2021-12-29 NOTE — MAU Provider Note (Signed)
?History  ?  ? ?CSN: [id number] ? ?Arrival date and time: 12/29/21 1503 ? ? Event Date/Time  ? First Provider Initiated Contact with Patient 12/29/21 1606   ?  ? ?Chief Complaint  ?Patient presents with  ? Abdominal Pain  ? ?Rhonda Dixon is 21 y.o. G1P0 at G1P0 who presents today with RLQ pain since this morning. She denies any contractions or vaginal bleeding. She has felt some fetal movement. Last BM 12/27/2021, very painful and formed BM.  ? ?Abdominal Pain ?This is a new problem. The current episode started today (around 0600). The onset quality is sudden. The problem occurs constantly. The problem is unchanged. The pain is located in the RLQ. The pain is at a severity of 7/10. The quality of the pain is described as cramping. The pain radiates to the LLQ. Associated symptoms include nausea and vomiting (x1 this morning). Treatments tried: warm shower. The treatment provided no improvement relief.  ? ?OB History   ? ? Gravida  ?1  ? Para  ?   ? Term  ?   ? Preterm  ?   ? AB  ?   ? Living  ?   ?  ? ? SAB  ?   ? IAB  ?   ? Ectopic  ?   ? Multiple  ?   ? Live Births  ?   ?   ?  ?  ? ? ?Past Medical History:  ?Diagnosis Date  ? Medical history non-contributory   ? ? ?Past Surgical History:  ?Procedure Laterality Date  ? NO PAST SURGERIES    ? ? ?Family History  ?Problem Relation Age of Onset  ? Asthma Mother   ? Lupus Mother   ? Autoimmune disease Mother   ? ? ?Social History  ? ?Tobacco Use  ? Smoking status: Never  ? Smokeless tobacco: Never  ?Vaping Use  ? Vaping Use: Never used  ?Substance Use Topics  ? Alcohol use: Not Currently  ?  Comment: occ  ? Drug use: No  ? ? ?Allergies: No Known Allergies ? ?Medications Prior to Admission  ?Medication Sig Dispense Refill Last Dose  ? acetaminophen (TYLENOL) 500 MG tablet Take 500 mg by mouth every 6 (six) hours as needed.   12/28/2021  ? Prenatal Vit-Fe Fumarate-FA (PRENATAL VITAMINS) 28-0.8 MG TABS Take 1 tablet by mouth daily. 30 tablet 3 12/27/2021  ? aspirin EC 81  MG tablet Take 1 tablet (81 mg total) by mouth daily. Swallow whole. 30 tablet 11   ? Doxylamine-Pyridoxine (DICLEGIS) 10-10 MG TBEC Take 1 tablet by mouth 2 (two) times daily. 60 tablet \   ? ? ?Review of Systems  ?Gastrointestinal:  Positive for abdominal pain, nausea and vomiting (x1 this morning).  ?All other systems reviewed and are negative. ?Physical Exam  ? ?Blood pressure 108/70, pulse 100, temperature 98.6 ?F (37 ?C), resp. rate 18, height 5\' 4"  (1.626 m), weight 87.5 kg, last menstrual period 07/28/2021, SpO2 100 %. ? ?Physical Exam ?Constitutional:   ?   Appearance: She is well-developed.  ?HENT:  ?   Head: Normocephalic.  ?Eyes:  ?   Pupils: Pupils are equal, round, and reactive to light.  ?Cardiovascular:  ?   Rate and Rhythm: Normal rate and regular rhythm.  ?   Heart sounds: Normal heart sounds.  ?Pulmonary:  ?   Effort: Pulmonary effort is normal. No respiratory distress.  ?   Breath sounds: Normal breath sounds.  ?Abdominal:  ?  Palpations: Abdomen is soft.  ?   Tenderness: There is no abdominal tenderness.  ?Genitourinary: ?   Vagina: No bleeding. Vaginal discharge: mucusy. ?   Comments: External: no lesion ?Vagina: small amount of white discharge ?Dilation: Closed ?Exam by:: Zorita Pang, CNM ?Musculoskeletal:     ?   General: Normal range of motion.  ?   Cervical back: Normal range of motion and neck supple.  ?Skin: ?   General: Skin is warm and dry.  ?Neurological:  ?   Mental Status: She is alert and oriented to person, place, and time.  ?Psychiatric:     ?   Mood and Affect: Mood normal.     ?   Behavior: Behavior normal.  ? ?Results for orders placed or performed during the hospital encounter of 12/29/21 (from the past 24 hour(s))  ?Urinalysis, Routine w reflex microscopic Urine, Clean Catch     Status: Abnormal  ? Collection Time: 12/29/21  4:09 PM  ?Result Value Ref Range  ? Color, Urine YELLOW YELLOW  ? APPearance HAZY (A) CLEAR  ? Specific Gravity, Urine 1.023 1.005 - 1.030  ? pH 5.0 5.0 -  8.0  ? Glucose, UA NEGATIVE NEGATIVE mg/dL  ? Hgb urine dipstick NEGATIVE NEGATIVE  ? Bilirubin Urine NEGATIVE NEGATIVE  ? Ketones, ur 5 (A) NEGATIVE mg/dL  ? Protein, ur NEGATIVE NEGATIVE mg/dL  ? Nitrite NEGATIVE NEGATIVE  ? Leukocytes,Ua NEGATIVE NEGATIVE  ?CBC     Status: Abnormal  ? Collection Time: 12/29/21  4:20 PM  ?Result Value Ref Range  ? WBC 6.3 4.0 - 10.5 K/uL  ? RBC 3.79 (L) 3.87 - 5.11 MIL/uL  ? Hemoglobin 11.6 (L) 12.0 - 15.0 g/dL  ? HCT 34.2 (L) 36.0 - 46.0 %  ? MCV 90.2 80.0 - 100.0 fL  ? MCH 30.6 26.0 - 34.0 pg  ? MCHC 33.9 30.0 - 36.0 g/dL  ? RDW 13.2 11.5 - 15.5 %  ? Platelets 314 150 - 400 K/uL  ? nRBC 0.0 0.0 - 0.2 %  ?Comprehensive metabolic panel     Status: Abnormal  ? Collection Time: 12/29/21  4:20 PM  ?Result Value Ref Range  ? Sodium 136 135 - 145 mmol/L  ? Potassium 3.5 3.5 - 5.1 mmol/L  ? Chloride 107 98 - 111 mmol/L  ? CO2 23 22 - 32 mmol/L  ? Glucose, Bld 85 70 - 99 mg/dL  ? BUN <5 (L) 6 - 20 mg/dL  ? Creatinine, Ser 0.66 0.44 - 1.00 mg/dL  ? Calcium 8.9 8.9 - 10.3 mg/dL  ? Total Protein 6.0 (L) 6.5 - 8.1 g/dL  ? Albumin 3.1 (L) 3.5 - 5.0 g/dL  ? AST 17 15 - 41 U/L  ? ALT 16 0 - 44 U/L  ? Alkaline Phosphatase 39 38 - 126 U/L  ? Total Bilirubin 0.5 0.3 - 1.2 mg/dL  ? GFR, Estimated >60 >60 mL/min  ? Anion gap 6 5 - 15  ?Amylase     Status: None  ? Collection Time: 12/29/21  4:20 PM  ?Result Value Ref Range  ? Amylase 55 28 - 100 U/L  ?Lipase, blood     Status: None  ? Collection Time: 12/29/21  4:20 PM  ?Result Value Ref Range  ? Lipase 35 11 - 51 U/L  ? ? ?MAU Course  ?Procedures ? ?MDM ? ?Patient has had flexeril for pain.  ? ?[place]: CL 3.47cm, otherwise normal [place]  ? ?1903: Patient left the unit AMA prior to  getting results.  ? ?Assessment and Plan  ? ?1. Round ligament pain   ?2. [redacted] weeks gestation of pregnancy   ? ?Patient left AMA ? ?Thressa Sheller DNP, CNM  ?12/29/21  7:14 PM  ? ?

## 2021-12-29 NOTE — Progress Notes (Signed)
Pt left AMA, states must leave to pick up step daughter. ?

## 2021-12-29 NOTE — Progress Notes (Signed)
Pt preparing to leave AMA, radiology called and now ready to perform ultrasound. RN advised pt she may now go to ultrasound, patient states she will stay and go to ultrasound. ?

## 2021-12-29 NOTE — Progress Notes (Signed)
Pt states she must leave to pick up step daughter asking how much longer it will be. Pt informed she's waiting to go to radiology for an ultrasound and RN is unsure when she's going.  Pt advised if she needed to leave, she must sign an AMA form d/t leaving prior to completion of exam.  Pt verbalized understanding and states she's leaving. ?

## 2021-12-29 NOTE — MAU Note (Incomplete)
.  Rhonda Dixon is a 21 y.o. at [redacted]w[redacted]d here in MAU reporting: she had some spotting yesterday. None today but started having crampy pain in her RLQ since early this morning.  Stated she had some burning with urination today as well.  ? ?Onset of complaint: this morning ?Pain score: 7/10 ?Vitals:  ? 12/29/21 1531  ?BP: 117/71  ?Pulse: 100  ?Resp: 18  ?Temp: 98.6 ?F (37 ?C)  ?   ?FHT:147 ?Lab orders placed from triage:  u/a ? ? ?
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
## 2022-01-07 DIAGNOSIS — H5213 Myopia, bilateral: Secondary | ICD-10-CM | POA: Diagnosis not present

## 2022-01-14 ENCOUNTER — Other Ambulatory Visit: Payer: Self-pay | Admitting: *Deleted

## 2022-01-14 ENCOUNTER — Ambulatory Visit: Payer: Medicaid Other | Attending: Family Medicine

## 2022-01-14 ENCOUNTER — Ambulatory Visit: Payer: Medicaid Other | Admitting: *Deleted

## 2022-01-14 VITALS — BP 113/68 | HR 86

## 2022-01-14 DIAGNOSIS — Z3481 Encounter for supervision of other normal pregnancy, first trimester: Secondary | ICD-10-CM | POA: Diagnosis not present

## 2022-01-14 DIAGNOSIS — O99212 Obesity complicating pregnancy, second trimester: Secondary | ICD-10-CM

## 2022-01-14 DIAGNOSIS — Z363 Encounter for antenatal screening for malformations: Secondary | ICD-10-CM | POA: Diagnosis present

## 2022-01-14 DIAGNOSIS — Z3A22 22 weeks gestation of pregnancy: Secondary | ICD-10-CM | POA: Diagnosis not present

## 2022-01-14 DIAGNOSIS — Z362 Encounter for other antenatal screening follow-up: Secondary | ICD-10-CM

## 2022-01-15 ENCOUNTER — Ambulatory Visit (INDEPENDENT_AMBULATORY_CARE_PROVIDER_SITE_OTHER): Payer: Medicaid Other | Admitting: Family Medicine

## 2022-01-15 ENCOUNTER — Other Ambulatory Visit (HOSPITAL_COMMUNITY)
Admission: RE | Admit: 2022-01-15 | Discharge: 2022-01-15 | Disposition: A | Discharge status: Home / Self Care | Payer: Medicaid Other | Source: Ambulatory Visit | Attending: Family Medicine | Admitting: Family Medicine

## 2022-01-15 VITALS — BP 110/70 | HR 88 | Wt 200.5 lb

## 2022-01-15 DIAGNOSIS — N898 Other specified noninflammatory disorders of vagina: Secondary | ICD-10-CM

## 2022-01-15 DIAGNOSIS — Z3480 Encounter for supervision of other normal pregnancy, unspecified trimester: Secondary | ICD-10-CM

## 2022-01-15 DIAGNOSIS — B3731 Acute candidiasis of vulva and vagina: Secondary | ICD-10-CM

## 2022-01-15 LAB — POCT WET PREP (WET MOUNT)
Clue Cells Wet Prep Whiff POC: NEGATIVE
Trichomonas Wet Prep HPF POC: ABSENT

## 2022-01-15 MED ORDER — CLOTRIMAZOLE 2 % VA CREA: 1.0000 | TOPICAL_CREAM | Freq: Every day | VAGINAL | 0 refills | Status: AC

## 2022-01-15 NOTE — Patient Instructions (Signed)
I'll let you know what your testing shows and will send in a prescription to your pharmacy if needed.  ? ?Follow up with your [facility] doctor as scheduled.  ? ?Take aspirin daily with your prenatal vitamins.   ? ?

## 2022-01-15 NOTE — Progress Notes (Signed)
? ? ?  SUBJECTIVE:  ? ?CHIEF COMPLAINT / HPI:  ? ?Chief Complaint  ?Patient presents with  ? Vaginal Itching  ? ? ?Rhonda Dixon is a 21 y.o. female presents for  ? ?Vaginal Discharge ?Having vaginal discharge for 7 days. She is currently pregnant. Good fetal movement, no contractions, no vaginal bleeding, no fluid leaking, no contractions.  ?Discharge consistency: clumpy ?Discharge color: white ?Medications tried: none  ?No recent antibiotic use.  ?Possible STD exposure: no ? ?Symptoms ?Fever: no ?Dysuria:no ?Vaginal bleeding: no ?Abdomen or Pelvic pain: no ?Back pain: no ?Genital sores or ulcers:no ?Rash: no ?Pain during sex: no ?Odor: no  ? ? ? ?PERTINENT  PMH / PSH: reviewed and updated as appropriate  ? ?OBJECTIVE:  ? ?BP 110/70   Pulse 88   Wt 200 lb 8 oz (90.9 kg)   LMP 07/28/2021 (Approximate)   SpO2 100%   BMI 34.42 kg/m?   ?GEN: well appearing female in no acute distress  ?CVS: well perfused  ?RESP: speaking in full sentences without pause  ?ABD: soft, non-tender, non-distended, no palpable masses, gravid  ?Pelvic exam: normal external genitalia, vulva, VAGINA and CERVIX: White  discharge on vaginal walls and cervix, WET MOUNT done - results: Yeast seen, no trichomonas, negative whiff test, no clue cells, exam chaperoned by CMA Tashira.  ? ? ? ?ASSESSMENT/PLAN:  ? ? ? ?Vaginal candidiasis ?Symptoms consistent with this.  Confirmed on wet prep. G/C Percell Locus is pending.  ?- Treatment: Topical clotrimazole cream  ?- F/U if symptoms not improving or getting worse.  ?- Will f/u on G/C Chlamydia  ?- Self care instructions given including avoiding douching.  ?- Return precautions including abdominal pain, fever, chills, nausea, or vomiting given.  ? ? ?Pregnancy 23 weeks  ?Advised to take aspirin daily. Continue prenatal vitamins. Follow with OB as scheduled  ? ? ? ? ? ? ? ?Rhonda Cabal, DO ?[facility]  ? ? ? ? ? ?

## 2022-01-18 LAB — CERVICOVAGINAL ANCILLARY ONLY
Chlamydia: NEGATIVE
Comment: NEGATIVE
Comment: NEGATIVE
Comment: NORMAL
Neisseria Gonorrhea: NEGATIVE
Trichomonas: NEGATIVE

## 2022-02-16 ENCOUNTER — Encounter: Payer: Self-pay | Admitting: *Deleted

## 2022-02-18 ENCOUNTER — Ambulatory Visit: Payer: Medicaid Other | Admitting: *Deleted

## 2022-02-18 ENCOUNTER — Other Ambulatory Visit: Payer: Self-pay | Admitting: *Deleted

## 2022-02-18 ENCOUNTER — Ambulatory Visit: Payer: Medicaid Other | Attending: Maternal & Fetal Medicine

## 2022-02-18 VITALS — BP 113/75 | HR 96

## 2022-02-18 DIAGNOSIS — E669 Obesity, unspecified: Secondary | ICD-10-CM | POA: Diagnosis not present

## 2022-02-18 DIAGNOSIS — O99212 Obesity complicating pregnancy, second trimester: Secondary | ICD-10-CM

## 2022-02-18 DIAGNOSIS — Z362 Encounter for other antenatal screening follow-up: Secondary | ICD-10-CM | POA: Diagnosis present

## 2022-02-18 DIAGNOSIS — Z3A27 27 weeks gestation of pregnancy: Secondary | ICD-10-CM | POA: Diagnosis not present

## 2022-03-02 ENCOUNTER — Inpatient Hospital Stay (HOSPITAL_COMMUNITY)
Admission: AD | Admit: 2022-03-02 | Discharge: 2022-03-02 | Disposition: A | Discharge status: Home / Self Care | Payer: Medicaid Other | Attending: Obstetrics and Gynecology | Admitting: Obstetrics and Gynecology

## 2022-03-02 ENCOUNTER — Other Ambulatory Visit: Payer: Self-pay

## 2022-03-02 ENCOUNTER — Encounter (HOSPITAL_COMMUNITY): Payer: Self-pay | Admitting: Obstetrics and Gynecology

## 2022-03-02 DIAGNOSIS — Z3689 Encounter for other specified antenatal screening: Secondary | ICD-10-CM

## 2022-03-02 DIAGNOSIS — O26893 Other specified pregnancy related conditions, third trimester: Secondary | ICD-10-CM | POA: Diagnosis present

## 2022-03-02 DIAGNOSIS — Z3A29 29 weeks gestation of pregnancy: Secondary | ICD-10-CM | POA: Insufficient documentation

## 2022-03-02 DIAGNOSIS — K219 Gastro-esophageal reflux disease without esophagitis: Secondary | ICD-10-CM | POA: Insufficient documentation

## 2022-03-02 DIAGNOSIS — R102 Pelvic and perineal pain: Secondary | ICD-10-CM | POA: Insufficient documentation

## 2022-03-02 DIAGNOSIS — R1032 Left lower quadrant pain: Secondary | ICD-10-CM | POA: Insufficient documentation

## 2022-03-02 DIAGNOSIS — O99613 Diseases of the digestive system complicating pregnancy, third trimester: Secondary | ICD-10-CM | POA: Insufficient documentation

## 2022-03-02 DIAGNOSIS — M549 Dorsalgia, unspecified: Secondary | ICD-10-CM | POA: Insufficient documentation

## 2022-03-02 DIAGNOSIS — R1031 Right lower quadrant pain: Secondary | ICD-10-CM | POA: Diagnosis not present

## 2022-03-02 LAB — URINALYSIS, ROUTINE W REFLEX MICROSCOPIC
Bacteria, UA: NONE SEEN
Bilirubin Urine: NEGATIVE
Glucose, UA: 50 mg/dL — AB
Hgb urine dipstick: NEGATIVE
Ketones, ur: NEGATIVE mg/dL
Nitrite: NEGATIVE
Protein, ur: 30 mg/dL — AB
Specific Gravity, Urine: 1.028 (ref 1.005–1.030)
pH: 6 (ref 5.0–8.0)

## 2022-03-02 MED ORDER — CYCLOBENZAPRINE HCL 10 MG PO TABS: 10.0000 mg | ORAL_TABLET | Freq: Two times a day (BID) | ORAL | 0 refills | Status: DC | PRN

## 2022-03-02 MED ORDER — MISC. DEVICES MISC: 0 refills | Status: DC

## 2022-03-02 MED ORDER — ACETAMINOPHEN 500 MG PO TABS
1000.0000 mg | ORAL_TABLET | Freq: Once | ORAL | Status: AC
  Administered 2022-03-02: 1000 mg via ORAL
  Filled 2022-03-02: qty 2

## 2022-03-02 MED ORDER — FAMOTIDINE 10 MG PO TABS: 10.0000 mg | ORAL_TABLET | Freq: Two times a day (BID) | ORAL | 0 refills | Status: DC

## 2022-03-02 MED ORDER — ALUM & MAG HYDROXIDE-SIMETH 200-200-20 MG/5ML PO SUSP
30.0000 mL | Freq: Once | ORAL | Status: AC
  Administered 2022-03-02: 30 mL via ORAL
  Filled 2022-03-02: qty 30

## 2022-03-02 MED ORDER — OMEPRAZOLE MAGNESIUM 20 MG PO TBEC: 20.0000 mg | DELAYED_RELEASE_TABLET | Freq: Every day | ORAL | 0 refills | Status: DC

## 2022-03-02 NOTE — Discharge Instructions (Addendum)
Round Ligament Pain during Pregnancy Many women will experience a type of pain referred to as "round ligament pain" during their pregnancy. This is associated with abdominal pain or discomfort. Since any type of abdominal pain during pregnancy can be disconcerting, it is important to talk about round ligament pain to relieve any anxiety or fears you may have regarding the symptoms you are feeling. Round ligament pain is due to normal changes that take place in the body during pregnancy. It is caused by stretching of the round ligaments attached to the uterus. More commonly it occurs on the right side of the pelvis. Round Ligament: An Overview Typically in the non-pregnant state the uterus is about the size of an apple or pear. There are thick ligaments which hold the uterus in place in the abdomen, referred to as round ligaments. During pregnancy, your uterus will expand in size and weight, and the ligaments supporting it will have to stretch, becoming longer and thinner. As these ligaments pull and tug they may irritate nearby nerve fibers, which causes pain. The severity of the pain in some cases can seem extreme. Some common symptoms of round ligament pain include:  Ligament spasms or contractions/cramps that trigger a sharp pain typically on the right side of the abdomen.  Pain upon waking or suddenly rolling over in your sleep.  Pain in the abdomen that is sharp brought on by exercise or other vigorous activity. Similar Problems Round ligament pain is often mistaken for other medical conditions because the symptoms are similar. Acute abdominal pain during pregnancy may also be a sign of other conditions including:  Abdominal cramps - Some abdominal pain is simply caused by change in bowel habits associated with pregnancy. Gas is a common problem that can cause sharp, shooting pain. You should always seek out medical care if your pain is accompanied by fever, chills, pain  upon urination or if you have difficulty walking. Further exams and tests will be conducted to ensure that you do not have a more serious condition. It is not uncommon for women with lower abdominal pain to have a urinary tract infection, thus you may also be asked for a urine sample. Treatment If all other conditions are ruled out you can treat your round ligament pain relatively easily. You may be advised to take some acetaminophen (Tylenol) to reduce the severity of any persistent pain and asked to reduce your activity level. You can apply a heating pad to the area of pain or take a warm bath. Lying on the opposite side of the pain may help as well. Most women will find relief from round ligament pain simply by altering their daily routines slightly. The good news is round ligament pain will disappear completely once you have given birth to your child!   Locations that carry Maternity Belts/Belly Bands (01/2022):  [facility]  [address], [place], [place] [address]  [phone]  [facility]  [address] [place], [place] [address]  [phone]  [facility]  [address] [place], [place] [address]  [phone]  [facility]  [address] [place], [place], [place] [address]  [phone]

## 2022-03-02 NOTE — MAU Provider Note (Signed)
History     CSN: [id number]  Arrival date and time: 03/02/22 1201   Event Date/Time   First Provider Initiated Contact with Patient 03/02/22 1312      Chief Complaint  Patient presents with   Pelvic Pain   Heartburn   HPI Rhonda Dixon is a 21 y.o. G1P0 at [redacted]w[redacted]d who presents to MAU with chief complaints of pelvic pain and acid reflux. Patient states they are identical in severity.   Acid Reflux This is a recurrent problem, worsening as pregnancy progresses. Patient endorses constant pain in her esophagus which worsens when she lies down and tries to sleep. Diet today includes a breaded chicken biscuit and chicken tenders. Patient does not have an existing prescription to help with her reflux.  Pelvic pain Patient endorses pain in the middle of her pelvic behind her mons pubis. She also reports bilateral lower abdominal pain which worsens with prolonged standing and walking.    Back Pain Patient also c/o back pain, recurrent. Pain score is 7/10. She denies alleviating factors.   Patient is planning to work this evening and states she cannot have any medicine which might make her sleepy.   Patient [facility] with [facility]. She is scheduled for serial MFM scans as indicated by maternal obesity.  OB History     Gravida  1   Para      Term      Preterm      AB      Living         SAB      IAB      Ectopic      Multiple      Live Births              Past Medical History:  Diagnosis Date   Medical history non-contributory     Past Surgical History:  Procedure Laterality Date   NO PAST SURGERIES      Family History  Problem Relation Age of Onset   Asthma Mother    Lupus Mother    Autoimmune disease Mother     Social History   Tobacco Use   Smoking status: Never   Smokeless tobacco: Never  Vaping Use   Vaping Use: Former   Substances: Nicotine, Flavoring  Substance Use Topics   Alcohol use: Not Currently    Comment: occ   Drug use: No     Allergies: No Known Allergies  Medications Prior to Admission  Medication Sig Dispense Refill Last Dose   acetaminophen (TYLENOL) 500 MG tablet Take 500 mg by mouth every 6 (six) hours as needed.      aspirin EC 81 MG tablet Take 1 tablet (81 mg total) by mouth daily. Swallow whole. (Patient not taking: Reported on 01/14/2022) 30 tablet 11    Prenatal Vit-Fe Fumarate-FA (PRENATAL VITAMINS) 28-0.8 MG TABS Take 1 tablet by mouth daily. 30 tablet 3     Review of Systems  Genitourinary:  Positive for pelvic pain.  All other systems reviewed and are negative.  Physical Exam   Blood pressure 107/72, pulse 87, temperature 99 F (37.2 C), temperature source Oral, resp. rate 16, height 5\' 4"  (1.626 m), weight 92.4 kg, last menstrual period 07/28/2021, SpO2 100 %.  Physical Exam Vitals and nursing note reviewed. Exam conducted with a chaperone present.  Constitutional:      Appearance: Normal appearance.  Cardiovascular:     Rate and Rhythm: Normal rate and regular rhythm.  Pulses: Normal pulses.     Heart sounds: Normal heart sounds.  Pulmonary:     Effort: Pulmonary effort is normal.     Breath sounds: Normal breath sounds.  Abdominal:     Tenderness: There is no abdominal tenderness. There is no guarding.     Comments: Gravid  Skin:    Capillary Refill: Capillary refill takes less than 2 seconds.  Neurological:     Mental Status: She is alert and oriented to person, place, and time.  Psychiatric:        Mood and Affect: Mood normal.        Behavior: Behavior normal.        Thought Content: Thought content normal.        Judgment: Judgment normal.     MAU Course  Procedures  MDM  --Reactive tracing: baseline 135, mod var, + 10 x 10 accels, no decels --Toco: occasional UI, otherwise quiet --Discussed importance of revising diet to limit foods associated with intensifying GERD symptoms. Reviewed behaviors changes to reduce frequency and intensity of symptoms (restated  in AVS) --Discussed round ligament pain and pub symphysis pain, advised maternity belt. Given paper rx and list of pharmacies which have confirmed in stock for Medicaid patients  Orders Placed This Encounter  Procedures   Urinalysis, Routine w reflex microscopic Urine, Clean Catch   Discharge patient   Results for orders placed or performed during the hospital encounter of 03/02/22 (from the past 24 hour(s))  Urinalysis, Routine w reflex microscopic Urine, Clean Catch     Status: Abnormal   Collection Time: 03/02/22  1:09 PM  Result Value Ref Range   Color, Urine YELLOW YELLOW   APPearance HAZY (A) CLEAR   Specific Gravity, Urine 1.028 1.005 - 1.030   pH 6.0 5.0 - 8.0   Glucose, UA 50 (A) NEGATIVE mg/dL   Hgb urine dipstick NEGATIVE NEGATIVE   Bilirubin Urine NEGATIVE NEGATIVE   Ketones, ur NEGATIVE NEGATIVE mg/dL   Protein, ur 30 (A) NEGATIVE mg/dL   Nitrite NEGATIVE NEGATIVE   Leukocytes,Ua TRACE (A) NEGATIVE   WBC, UA 0-5 0 - 5 WBC/hpf   Bacteria, UA NONE SEEN NONE SEEN   Squamous Epithelial / LPF 6-10 0 - 5   Mucus PRESENT    Ca Oxalate Crys, UA PRESENT    Meds ordered this encounter  Medications   alum & mag hydroxide-simeth (MAALOX/MYLANTA) 200-200-20 MG/5ML suspension 30 mL   acetaminophen (TYLENOL) tablet 1,000 mg   cyclobenzaprine (FLEXERIL) 10 MG tablet    Sig: Take 1 tablet (10 mg total) by mouth 2 (two) times daily as needed for muscle spasms.    Dispense:  20 tablet    Refill:  0    Order Specific Question:   Supervising Provider    Answer:   Fabio Bering   omeprazole (PRILOSEC OTC) 20 MG tablet    Sig: Take 1 tablet (20 mg total) by mouth daily.    Dispense:  30 tablet    Refill:  0    Order Specific Question:   Supervising Provider    Answer:   Fabio Bering   famotidine (PEPCID) 10 MG tablet    Sig: Take 1 tablet (10 mg total) by mouth 2 (two) times daily.    Dispense:  60 tablet    Refill:  0    Order Specific Question:    Supervising Provider    Answer:   Milas Hock [[id number]]   Misc. Devices MISC  Sig: Dispense one maternity belt for patient    Dispense:  1 each    Refill:  0    Order Specific Question:   Supervising Provider    Answer:   Milas Hock [[id number]]   Assessment and Plan  --21 y.o. G1P0 at [redacted]w[redacted]d  --Reactive tracing --GERD symptoms resolved s/p GI Cocktail --Round ligament and pubic symphysis pain in third trimester --Discharge home in stable condition  Calvert Cantor, MSA, MSN, CNM 03/02/2022, 6:19 PM

## 2022-03-02 NOTE — MAU Note (Signed)
Rhonda Dixon is a 21 y.o. at [redacted]w[redacted]d here in MAU reporting: heartburn and pelvic pain. Pelvic pain has been going on for a week. Has not taken any meds.   Onset of complaint: ongoing  Pain score: 7/10  Vitals:   03/02/22 1219  BP: 107/72  Pulse: 87  Resp: 16  Temp: 99 F (37.2 C)  SpO2: 100%     FHT:140  Lab orders placed from triage: UA

## 2022-03-25 ENCOUNTER — Ambulatory Visit: Payer: Medicaid Other | Attending: Obstetrics

## 2022-03-25 ENCOUNTER — Ambulatory Visit: Payer: Medicaid Other | Admitting: *Deleted

## 2022-03-25 VITALS — BP 111/75 | HR 95

## 2022-03-25 DIAGNOSIS — O409XX0 Polyhydramnios, unspecified trimester, not applicable or unspecified: Secondary | ICD-10-CM | POA: Insufficient documentation

## 2022-03-25 DIAGNOSIS — Z3A32 32 weeks gestation of pregnancy: Secondary | ICD-10-CM

## 2022-03-25 DIAGNOSIS — E669 Obesity, unspecified: Secondary | ICD-10-CM | POA: Diagnosis not present

## 2022-03-25 DIAGNOSIS — O99212 Obesity complicating pregnancy, second trimester: Secondary | ICD-10-CM | POA: Insufficient documentation

## 2022-03-25 DIAGNOSIS — O99213 Obesity complicating pregnancy, third trimester: Secondary | ICD-10-CM

## 2022-04-06 ENCOUNTER — Telehealth: Payer: Self-pay

## 2022-04-06 ENCOUNTER — Other Ambulatory Visit: Payer: Self-pay

## 2022-04-06 ENCOUNTER — Ambulatory Visit (INDEPENDENT_AMBULATORY_CARE_PROVIDER_SITE_OTHER): Payer: Medicaid Other | Admitting: Student

## 2022-04-06 VITALS — BP 113/73 | HR 90 | Wt 208.4 lb

## 2022-04-06 DIAGNOSIS — M7918 Myalgia, other site: Secondary | ICD-10-CM | POA: Diagnosis not present

## 2022-04-06 NOTE — Telephone Encounter (Signed)
Patient calls nurse line regarding concerns for muscle cramps. She reports that she has been having right sided muscle cramps in gluteus maximus for the last three days. Denies redness or swelling. Patient is [redacted] weeks pregnant. It appears that she was referred to Maternal Fetal Medicine. Patient reports that she has not yet been able to establish care with doctor.   Reports good fetal movement. Denies contractions, vaginal bleeding or LOF.   Scheduled patient for further evaluation this afternoon with Dr. Laroy Apple.   MAU precautions discussed.   Rhonda Prude, RN

## 2022-04-06 NOTE — Progress Notes (Signed)
    SUBJECTIVE:   CHIEF COMPLAINT / HPI: Right-sided buttocks/back pain  Patient says she has been having muscle cramps in right buttocks for past 4 days and happens when walking and working and limping during shift if she walks on it for too long. Start on right side and goes down to buttocks on right side but does not travel down her legs.  Denies any sensation loss.  Denies any trauma or falls.  Has not taken anything and nothing has been helping. Epson salt bath not helping.  Pain is mostly been occurring when she is getting up and walking for long periods of time.  She has also been having pelvic pain and has been going on for past month.  She is interested in getting a belly band for this and was told that insurance can cover this.  Denies any contractions, vaginal bleeding, leaking of fluid, has had good fetal movement.   PERTINENT  PMH / PSH: gerd, gad  OBJECTIVE:   BP 113/73   Pulse 90   Wt 208 lb 6.4 oz (94.5 kg)   LMP 07/28/2021 (Approximate)   BMI 35.77 kg/m   General: NAD, awake, alert, responsive to questions Head: Normocephalic atraumatic CV: Regular rate and rhythm no murmurs rubs or gallops Respiratory: Clear to ausculation bilaterally, no wheezes rales or crackles, chest rises symmetrically,  no increased work of breathing Abdomen: Gravid abdomen, nontender, FHR 140s Extremities: Moves upper and lower extremities freely, no pitting edema Back: No CVA tenderness, no radiation or pain with straight leg test bilaterally, no tenderness to palpation on right side or buttocks.  No tenderness on spinal processes, gait normal able to walk on tiptoes and heels.  No sensation loss in lower extremities ASSESSMENT/PLAN:   Right buttock pain Most likely musculoskeletal strain on examination.  Worsened with prolonged walking.  Does not appear to be a spinal process/disc issue.  Given pregnancy currently advised Tylenol as needed and warm compresses. -Follow up in 1 week for  prenatal visit    Levin Erp, MD [facility]

## 2022-04-06 NOTE — Assessment & Plan Note (Signed)
Most likely musculoskeletal strain on examination.  Worsened with prolonged walking.  Does not appear to be a spinal process/disc issue.  Given pregnancy currently advised Tylenol as needed and warm compresses. -Follow up in 1 week for prenatal visit

## 2022-04-06 NOTE — Patient Instructions (Addendum)
It was great to see you! Thank you for allowing me to participate in your care!   I recommend that you always bring your medications to each appointment as this makes it easy to ensure we are on the correct medications and helps [place] not miss when refills are needed.  Our plans for today:  - please keep warm compresses on here - please follow up in 1 week for prenatal visit    Take care and seek immediate care sooner if you develop any concerns. Please remember to show up 15 minutes before your scheduled appointment time!  Levin Erp, MD [facility]   Safe Medications in Pregnancy   Acne:  Benzoyl Peroxide  Salicylic Acid   Backache/Headache:  Tylenol: 2 regular strength every 4 hours OR               2 Extra strength every 6 hours   Colds/Coughs/Allergies:  Benadryl (alcohol free) 25 mg every 6 hours as needed  Breath right strips  Claritin  Cepacol throat lozenges  Chloraseptic throat spray  Cold-Eeze- up to three times per day  Cough drops, alcohol free  Flonase (by prescription only)  Guaifenesin  Mucinex  Robitussin DM (plain only, alcohol free)  Saline nasal spray/drops  Sudafed (pseudoephedrine) & Actifed * use only after [redacted] weeks gestation and if you do not have high blood pressure  Tylenol  Vicks Vaporub  Zinc lozenges  Zyrtec   Constipation:  Colace  Ducolax suppositories  Fleet enema  Glycerin suppositories  Metamucil  Milk of magnesia  Miralax  Senokot  Smooth move tea   Diarrhea:  Kaopectate  Imodium A-D   *NO pepto Bismol   Hemorrhoids:  Anusol  Anusol HC  Preparation H  Tucks   Indigestion:  Tums  Maalox  Mylanta  Zantac  Pepcid   Insomnia:  Benadryl (alcohol free) 25mg  every 6 hours as needed  Tylenol PM  Unisom, no Gelcaps   Leg Cramps:  Tums  MagGel   Nausea/Vomiting:  Bonine  Dramamine  Emetrol  Ginger extract  Sea bands  Meclizine  Nausea medication to take during pregnancy:  Unisom (doxylamine  succinate 25 mg tablets) Take one tablet daily at bedtime. If symptoms are not adequately controlled, the dose can be increased to a maximum recommended dose of two tablets daily (1/2 tablet in the morning, 1/2 tablet mid-afternoon and one at bedtime).  Vitamin B6 100mg  tablets. Take one tablet twice a day (up to 200 mg per day).   Skin Rashes:  Aveeno products  Benadryl cream or 25mg  every 6 hours as needed  Calamine Lotion  1% cortisone cream   Yeast infection:  Gyne-lotrimin 7  Monistat 7    **If taking multiple medications, please check labels to avoid duplicating the same active ingredients  **take medication as directed on the label  ** Do not exceed 4000 mg of tylenol in 24 hours  **Do not take medications that contain aspirin or ibuprofen

## 2022-04-13 ENCOUNTER — Ambulatory Visit (INDEPENDENT_AMBULATORY_CARE_PROVIDER_SITE_OTHER): Payer: Medicaid Other | Admitting: Student

## 2022-04-13 ENCOUNTER — Other Ambulatory Visit: Payer: Self-pay

## 2022-04-13 DIAGNOSIS — Z3483 Encounter for supervision of other normal pregnancy, third trimester: Secondary | ICD-10-CM | POA: Diagnosis not present

## 2022-04-13 DIAGNOSIS — Z23 Encounter for immunization: Secondary | ICD-10-CM | POA: Diagnosis not present

## 2022-04-13 MED ORDER — PRENATAL VITAMINS 28-0.8 MG PO TABS: 1.0000 | ORAL_TABLET | Freq: Every day | ORAL | 3 refills | Status: DC

## 2022-04-13 NOTE — Progress Notes (Signed)
  [facility] Prenatal Visit  Rhonda Dixon is a 21 y.o. G1P0 at [redacted]w[redacted]d for routine follow up.  She reports fetal movement. She denies vaginal bleeding, or loss of fluid. She does admit to some mild cramping and tightening of her belly which lasts about a minute 2-3/day. She believes these are braxton hicks contractions. Pt complains of muscle spasms in her right hip. These occur when she is walking around at work. States they last for about 10-20 minutes and resolve with rest.    A/P: Pregnancy at [redacted]w[redacted]d.  Doing well.   Routine prenatal care:  Infant feeding choice: both breast and formula Contraception choice: none at this time but will consider Infant circumcision desired yes Tdapwas given today. COVID vaccination was discussed and she declines booster today.  GBS/GC/CZ testing was not performed today. Preterm labor precautions reviewed. Safe sleep discussed. Kick counts reviewed. Preterm labor precautions reviewed. Pt is taking daily prenatal vitamin but not the baby aspirin.  She states the baby aspirin made her nervous and she did not want to take it.  I reassured her is perfectly safe but at this point she is very close to delivery and I do not know how much of a difference it would make to start now.   2. Pregnancy issues include the following and were addressed as appropriate today:  Patient was last seen for routine prenatal care on 12/24/2021 at 19 weeks 6 days.  Routine CBC, HIV, RPR  have been ordered today as patient did not come for 28 week visit.  Patient declined 1 hour GTT test today as she did not have time.  States she would prefer to have this done at her next visit on August 10. Mild polyhydramnios with AFI 25 was noted on [place] from 02/18/22 but resolved on most recent US 03/25/22. For her muscle spasm/cramping in her legs, advised patient to obtain a belly band to wear at work to see if this helps.  Also advised for her to rest frequently when she is able and  to take Tylenol as needed for pain.  Also advised that she can add an over-the-counter electrolyte packet to a water bottle once a day to make sure she is getting adequate electrolytes to prevent muscle cramps.  Problem list and pregnancy box updated: Yes.   Follow up at next apt with Dr. Miquel Dunn on 04/22/22

## 2022-04-13 NOTE — Patient Instructions (Signed)
It was great to see you! Thank you for allowing me to participate in your care!   Our plans for today:  -For the pain in your leg and pelvis, I recommend getting a belly band and see if this relieves pressure and using Tylenol.  Rest when you are able to, make sure you are drinking plenty of water.  You can try some over-the-counter electrolyte powder in your water to see if this helps with cramps. -Please return for your next OB appointment listed below  We are checking some labs today, I will call you if they are abnormal will send you a MyChart message or a letter if they are normal.  If you do not hear about your labs in the next 2 weeks please let us know.  Take care and seek immediate care sooner if you develop any concerns.   Dr. Erick Alley, DO [facility]

## 2022-04-14 LAB — RPR: RPR Ser Ql: NONREACTIVE

## 2022-04-14 LAB — CBC
Hematocrit: 37.5 % (ref 34.0–46.6)
Hemoglobin: 12.3 g/dL (ref 11.1–15.9)
MCH: 28.9 pg (ref 26.6–33.0)
MCHC: 32.8 g/dL (ref 31.5–35.7)
MCV: 88 fL (ref 79–97)
Platelets: 309 10*3/uL (ref 150–450)
RBC: 4.26 x10E6/uL (ref 3.77–5.28)
RDW: 13.1 % (ref 11.7–15.4)
WBC: 8 10*3/uL (ref 3.4–10.8)

## 2022-04-14 LAB — HIV ANTIBODY (ROUTINE TESTING W REFLEX): HIV Screen 4th Generation wRfx: NONREACTIVE

## 2022-04-22 ENCOUNTER — Ambulatory Visit (INDEPENDENT_AMBULATORY_CARE_PROVIDER_SITE_OTHER): Payer: Medicaid Other | Admitting: Family Medicine

## 2022-04-22 ENCOUNTER — Other Ambulatory Visit (HOSPITAL_COMMUNITY)
Admission: RE | Admit: 2022-04-22 | Discharge: 2022-04-22 | Disposition: A | Discharge status: Home / Self Care | Payer: Medicaid Other | Source: Ambulatory Visit | Attending: Family Medicine | Admitting: Family Medicine

## 2022-04-22 VITALS — BP 106/66 | HR 99 | Wt 211.0 lb

## 2022-04-22 DIAGNOSIS — Z3483 Encounter for supervision of other normal pregnancy, third trimester: Secondary | ICD-10-CM | POA: Insufficient documentation

## 2022-04-22 LAB — POCT 1 HR PRENATAL GLUCOSE: Glucose 1 Hr Prenatal, POC: 118 mg/dL

## 2022-04-22 NOTE — Progress Notes (Signed)
[facility] Visit  Rhonda Dixon is a 21 y.o. G1P0 at [redacted]w[redacted]d (via [redacted]w[redacted]d sono) who presents to [facility] for routine follow up. Prenatal course, history, notes, ultrasounds, and laboratory results reviewed.  Denies cramping/ctx, fluid leaking, vaginal bleeding, or decreased fetal movement. Taking PNV.    Primary Prenatal Care Provider: Dr Laroy Apple  Postpartum Plans: - delivery planning: SVD - circumcision: desires, having a female - feeding: both breast and formula - pediatrician: [facility] - contraception: undecided, discussed today  FHR: 142 bpm Uterine size: 37 cm  Assessment & Plan  1. Routine prenatal care: - 1 hr GTT done today, otherwise HIV/RPR/CBC wnl from last week - GBS and GC/chlamydia done today (h/o chlamydia) - [place] noted vertex presentation today - was not taking aspirin, limited prenatal care this pregnancy  2. ASCUS with h/o HR HPV pap - needs repeat August 2023, done today.  3. False positive Antibody screen- finalized testing negative.  4. Mild polyhydramnios- resolved on most recent US 03/25/22. Discussed with MFM Dr Parke Poisson, no further imaging needed and does not need early induction at 39 WGA. 1 hr GTT done today as patient declined last week and had not been since since 19 WGA appt.   Next prenatal visit in 1 weeks . Labor & fetal movement precautions & pre-eclampsia precautions discussed.  Burley Saver, MD [facility]

## 2022-04-22 NOTE — Patient Instructions (Signed)
Please make a follow up appointment in 1 weeks.  Pregnancy Related Return Precautions The follow are signs/symptoms that are abnormal in pregnancy and may require further evaluation by a physician: Go to the MAU at [facility] if: You have cramping/contractions that do not go away with drinking water, especially if they are lasting 30 seconds to 1.5 minutes, coming and going every 5-10 minutes for an hour or more, or are getting stronger and you cannot walk or talk while having a contraction/cramp. Your water breaks.  Sometimes it is a big gush of fluid, sometimes it is just a trickle that keeps getting your underwear wet or running down your legs You have vaginal bleeding.    You do not feel your baby moving like normal.  If you do not, get something to eat and drink (something cold or something with sugar like peanut butter or juice) and lay down and focus on feeling your baby move. If your baby is still not moving like normal, you should go to MAU. You should feel your baby move 6 times in one hour, or 10 times in two hours. You have a persistent headache that does not go away with 1 g of Tylenol, vision changes, chest pain, difficulty breathing, severe pain in your right upper abdomen, worsening leg swelling- these can all be signs of high blood pressure in pregnancy and need to be evaluated by a provider immediately  These are all concerning in pregnancy and if you have any of these I recommend you call your PCP and present to the Maternity Admissions Unit (map below) for further evaluation.  For any pregnancy-related emergencies, please go to the Maternity Admissions Unit in the [facility] at [facility]. You will use hospital Entrance C.    Our clinic number is [phone].   Dr Miquel Dunn

## 2022-04-22 NOTE — Addendum Note (Signed)
Addended by: Burley Saver E on: 04/22/2022 11:35 AM   Modules accepted: Orders

## 2022-04-26 LAB — CULTURE, BETA STREP (GROUP B ONLY): Strep Gp B Culture: NEGATIVE

## 2022-04-27 LAB — CYTOLOGY - PAP
Chlamydia: NEGATIVE
Comment: NEGATIVE
Comment: NORMAL
Neisseria Gonorrhea: NEGATIVE

## 2022-04-28 ENCOUNTER — Encounter: Payer: Self-pay | Admitting: Family Medicine

## 2022-04-28 ENCOUNTER — Telehealth: Payer: Self-pay | Admitting: Family Medicine

## 2022-04-28 DIAGNOSIS — Z8742 Personal history of other diseases of the female genital tract: Secondary | ICD-10-CM | POA: Insufficient documentation

## 2022-04-28 NOTE — Telephone Encounter (Signed)
Attempted to call patient- line disconnected.  Repeat pap LSIL- but in notes some cells suggestive of higher grade lesions. Will repeat pap in 6 months once postpatum- Care Gaps updated.  Burley Saver MD

## 2022-04-30 ENCOUNTER — Ambulatory Visit (INDEPENDENT_AMBULATORY_CARE_PROVIDER_SITE_OTHER): Payer: Medicaid Other | Admitting: Family Medicine

## 2022-04-30 ENCOUNTER — Encounter: Payer: Self-pay | Admitting: Family Medicine

## 2022-04-30 ENCOUNTER — Other Ambulatory Visit: Payer: Self-pay

## 2022-04-30 VITALS — BP 115/80 | HR 103 | Wt 214.0 lb

## 2022-04-30 DIAGNOSIS — Z3483 Encounter for supervision of other normal pregnancy, third trimester: Secondary | ICD-10-CM

## 2022-04-30 NOTE — Patient Instructions (Addendum)
It was wonderful to see you today.  Please bring ALL of your medications with you to every visit.   Today we talked about:  --Safe sleep for baby   Please follow up in 1 week   Thank you for choosing [facility].   Please call [phone] with any questions about today's appointment.  Please be sure to schedule follow up at the front  desk before you leave today.   Terisa Starr, MD  Family Medicine

## 2022-04-30 NOTE — Progress Notes (Signed)
  [facility] Prenatal Visit  Rhonda Dixon is a 21 y.o. G1P0 at [redacted]w[redacted]d here for routine follow up. She is dated by early ultrasound.  She reports no complaints. She reports fetal movement. She denies vaginal bleeding, contractions, or loss of fluid. She is not taking prenatal or aspirin. See flow sheet for details.  Vitals:   04/30/22 1125  BP: 115/80  Pulse: (!) 103    A/P: Pregnancy at [redacted]w[redacted]d.  Doing well.   Routine prenatal care:  Dating reviewed, dating tab is correct Fetal heart tones Appropriate Fundal height within expected range.  Fetal position confirmed Vertex using Leopold's .  Infant feeding choice: Both  Contraception choice: undecided, has never been on contraception, continue to discuss  Infant circumcision desired yes Pain control in labor discussed and patient desires Epidural.  Influenza vaccine not administered as not influenza season.   Tdap previously administered between 27-36 weeks  GBS and gc/chlamydia testing results were reviewed today.   Pregnancy education regarding labor, fetal movement,  benefits of breastfeeding, contraception, and safe infant sleep were discussed.  Prenatal at pharmacy, defer starting aspirin given GA  Labor and fetal movement precautions reviewed. Induction of labor discussed. BPP scheduled between 40-41 weeks. Schedule for IOL at 41 wks if not delivered at next visit BPP was scheduled at exactly at 41 weeks which is not ideal---this was earliest available between 40-41 weeks   2. Pregnancy issues include the following and were addressed as appropriate today:   LSIL Pap--needs repeat Pap at 6 months per Dr. Miquel Dunn.   Problem list and pregnancy box updated: Yes.   Follow up 1 week.

## 2022-05-06 NOTE — Progress Notes (Signed)
  [facility] Prenatal Visit  Rhonda Dixon is a 21 y.o. G1P0 at [redacted]w[redacted]d here for routine follow up. She is dated by early ultrasound.  She reports backache- worse when walking, has cut hours down from work  and at work help with resting feet. She reports fetal movement. She denies vaginal bleeding, contractions a few braxton hix here and there last a few seconds and stops, or loss of fluid-none. See flow sheet for details.  Vitals:   05/07/22 1134  BP: 113/79  Pulse: 90    A/P: Pregnancy at [redacted]w[redacted]d.  Doing well.   Routine prenatal care:  Dating reviewed, dating tab is correct Fetal heart tones Appropriate 155 Fundal height within expected range.  Fetal position confirmed Vertex using Leopold's .  Infant feeding choice: Both  Contraception choice: None Infant circumcision desired yes  Pain control in labor discussed and patient desires epidural.  Influenza vaccine not administered as not influenza season.   Tdap previously administered between 27-36 weeks  GBS and gc/chlamydia testing results were reviewed today.   Pregnancy education regarding labor, fetal movement,  benefits of breastfeeding, contraception, and safe infant sleep were discussed.  Labor and fetal movement precautions reviewed. Induction of labor discussed. Scheduled for induction at approximately 41 weeks. BPP scheduled at 41 weeks exactly-will plan to have CMA Cleatrice Burke try and reschedule this for earlier between 40-1  2. Pregnancy issues include the following and were addressed as appropriate today:  LSIL-needs repeat pap at 6 months Sent in prenatal vitamin again   I have scheduled patient for IOL at 41 weeks  Problem list and pregnancy box updated: Yes.   Follow up 1 week.

## 2022-05-07 ENCOUNTER — Ambulatory Visit (INDEPENDENT_AMBULATORY_CARE_PROVIDER_SITE_OTHER): Payer: Medicaid Other | Admitting: Student

## 2022-05-07 ENCOUNTER — Encounter: Payer: Self-pay | Admitting: Student

## 2022-05-07 ENCOUNTER — Other Ambulatory Visit: Payer: Self-pay

## 2022-05-07 VITALS — BP 113/79 | HR 90 | Wt 216.4 lb

## 2022-05-07 DIAGNOSIS — Z3483 Encounter for supervision of other normal pregnancy, third trimester: Secondary | ICD-10-CM

## 2022-05-07 DIAGNOSIS — Z3A38 38 weeks gestation of pregnancy: Secondary | ICD-10-CM

## 2022-05-07 MED ORDER — PRENATAL 27-0.8 MG PO TABS: 1.0000 | ORAL_TABLET | Freq: Every day | ORAL | 0 refills | Status: DC

## 2022-05-07 NOTE — Patient Instructions (Addendum)
It was great to see you! Thank you for allowing me to participate in your care!   Our plans for today:  - I have sent in a new prescription for prenatal vitamins  - We will try to move you ultrasound appointment up - We wills schedule induction for 41 weeks  Take care and seek immediate care sooner if you develop any concerns.  Levin Erp, MD

## 2022-05-11 ENCOUNTER — Other Ambulatory Visit: Payer: Self-pay | Admitting: Student

## 2022-05-11 ENCOUNTER — Encounter: Payer: Self-pay | Admitting: Student

## 2022-05-11 DIAGNOSIS — Z3483 Encounter for supervision of other normal pregnancy, third trimester: Secondary | ICD-10-CM

## 2022-05-11 MED ORDER — PRENATAL VITAMINS 28-0.8 MG PO TABS: 1.0000 | ORAL_TABLET | Freq: Every day | ORAL | 1 refills | Status: AC

## 2022-05-11 NOTE — Telephone Encounter (Signed)
-----   Message from Latrelle Dodrill, MD sent at 05/11/2022 11:41 AM EDT ----- Larwance Rote, thanks so much. She needs to be seen here as well - I scheduled her for Tuesday 9/5 at 1:30p on access to care. Can you let her know?  Thanks again [place]  ----- Message ----- From: Levin Erp, MD Sent: 05/11/2022   9:11 AM EDT To: Latrelle Dodrill, MD; Elton Sin, CMA  Thank you Jon Gills! ----- Message ----- From: Elton Sin, CMA Sent: 05/11/2022   9:08 AM EDT To: Latrelle Dodrill, MD; Levin Erp, MD  Pt appointment was moved up to this Friday Sept 1st at 2:30 pm. Will call pt to inform her. ----- Message ----- From: Latrelle Dodrill, MD Sent: 05/10/2022   3:56 PM EDT To: Levin Erp, MD; [facility]  Did you have any luck moving the BPP sooner?  At minimum we need to see her in our office later this week for a routine visit before she turns 40w  Thanks Latrelle Dodrill, MD

## 2022-05-11 NOTE — Telephone Encounter (Signed)
-----   Message from Latrelle Dodrill, MD sent at 05/10/2022  3:56 PM EDT ----- Did you have any luck moving the BPP sooner?  At minimum we need to see her in our office later this week for a routine visit before she turns 40w  Thanks Latrelle Dodrill, MD

## 2022-05-12 ENCOUNTER — Other Ambulatory Visit: Payer: Self-pay

## 2022-05-12 ENCOUNTER — Encounter (HOSPITAL_COMMUNITY): Payer: Self-pay | Admitting: Obstetrics & Gynecology

## 2022-05-12 ENCOUNTER — Inpatient Hospital Stay (HOSPITAL_COMMUNITY)
Admission: AD | Admit: 2022-05-12 | Discharge: 2022-05-12 | Discharge status: Against Medical Advice | Payer: Medicaid Other | Attending: Obstetrics & Gynecology | Admitting: Obstetrics & Gynecology

## 2022-05-12 DIAGNOSIS — Z3A39 39 weeks gestation of pregnancy: Secondary | ICD-10-CM | POA: Insufficient documentation

## 2022-05-12 DIAGNOSIS — O471 False labor at or after 37 completed weeks of gestation: Secondary | ICD-10-CM | POA: Diagnosis present

## 2022-05-12 DIAGNOSIS — Z5321 Procedure and treatment not carried out due to patient leaving prior to being seen by health care provider: Secondary | ICD-10-CM | POA: Diagnosis not present

## 2022-05-12 NOTE — MAU Note (Signed)
Dr Nobie Putnam at bedside to discuss plan of care with pt. Pt had voiced to RN that she would do whatever provider suggested she do (ie either stay for induction of labor or d/c home). When provider at bedside pt states she wants to go home and does not want to be induced. Risks and benefits discussed with pt per provider. Pt signed AMA form because provider wants to admit for induction.

## 2022-05-12 NOTE — MAU Provider Note (Signed)
Ms. SCARLETH BRAME is a G1P0 at [redacted]w[redacted]d seen in MAU for labor.  Initially evaluated due to the occasional D cells.. SVE by RN Dilation: 1.5 Effacement (%): 80 Cervical Position: Middle Station: -2 Presentation: Vertex Exam by:: weston,rn   NST - FHR: 135 bpm / moderate variability / accels present /patient will variable decels / TOCO: regular every 10-12 mins   Plan: Discussed at length with the patient she had had a few occasional decelerations and after discussing the case with the attending we recommend admission for induction.  The patient wishes to progress naturally and does not want induction.  She is willing to sign out AMA and will return.  Had a lengthy discussion regarding return precautions including decreased fetal movement, contractions closer together, gush of fluid, vaginal bleeding.  Patient has no further questions.  Keep scheduled appt with [facility] on 05/18/2022.  Patient also has MFM appointment on 05/14/2022.  Celedonio Savage, MD  05/12/2022 12:25 PM

## 2022-05-12 NOTE — MAU Note (Signed)
Rhonda Dixon is a 21 y.o. at [redacted]w[redacted]d here in MAU reporting: she's having "really bad ctxs" that began yesterday morning @ 0900.  Reports ctxs are 5-10 minutes apart.  Denies VB or LOF.  Endorses +FM. LMP: N/A Onset of complaint: yesterday Pain score: 7/10 Vitals:   05/12/22 1055  BP: 113/78  Pulse: 91  Resp: 19  Temp: 98.2 F (36.8 C)  SpO2: 100%     FHT:142 bpm Lab orders placed from triage:   None

## 2022-05-13 ENCOUNTER — Telehealth (HOSPITAL_COMMUNITY): Payer: Self-pay | Admitting: *Deleted

## 2022-05-13 ENCOUNTER — Encounter (HOSPITAL_COMMUNITY): Payer: Self-pay

## 2022-05-13 NOTE — Telephone Encounter (Signed)
Preadmission screen  

## 2022-05-14 ENCOUNTER — Encounter (HOSPITAL_COMMUNITY): Payer: Self-pay | Admitting: Obstetrics and Gynecology

## 2022-05-14 ENCOUNTER — Ambulatory Visit: Payer: Medicaid Other | Admitting: *Deleted

## 2022-05-14 ENCOUNTER — Telehealth (HOSPITAL_COMMUNITY): Payer: Self-pay | Admitting: *Deleted

## 2022-05-14 ENCOUNTER — Inpatient Hospital Stay (EMERGENCY_DEPARTMENT_HOSPITAL)
Admission: AD | Admit: 2022-05-14 | Discharge: 2022-05-15 | Disposition: A | Discharge status: Home / Self Care | Payer: Medicaid Other | Source: Home / Self Care | Attending: Obstetrics and Gynecology | Admitting: Obstetrics and Gynecology

## 2022-05-14 ENCOUNTER — Encounter: Payer: Self-pay | Admitting: *Deleted

## 2022-05-14 ENCOUNTER — Ambulatory Visit (HOSPITAL_BASED_OUTPATIENT_CLINIC_OR_DEPARTMENT_OTHER): Payer: Medicaid Other

## 2022-05-14 ENCOUNTER — Telehealth: Payer: Self-pay

## 2022-05-14 DIAGNOSIS — Z3483 Encounter for supervision of other normal pregnancy, third trimester: Secondary | ICD-10-CM | POA: Insufficient documentation

## 2022-05-14 DIAGNOSIS — O471 False labor at or after 37 completed weeks of gestation: Secondary | ICD-10-CM

## 2022-05-14 DIAGNOSIS — Z3A40 40 weeks gestation of pregnancy: Secondary | ICD-10-CM | POA: Insufficient documentation

## 2022-05-14 LAB — CBC
HCT: 37 % (ref 36.0–46.0)
Hemoglobin: 12.4 g/dL (ref 12.0–15.0)
MCH: 30.1 pg (ref 26.0–34.0)
MCHC: 33.5 g/dL (ref 30.0–36.0)
MCV: 89.8 fL (ref 80.0–100.0)
Platelets: 335 10*3/uL (ref 150–400)
RBC: 4.12 MIL/uL (ref 3.87–5.11)
RDW: 14.1 % (ref 11.5–15.5)
WBC: 8.6 10*3/uL (ref 4.0–10.5)
nRBC: 0 % (ref 0.0–0.2)

## 2022-05-14 MED ORDER — LACTATED RINGERS IV SOLN: INTRAVENOUS | Status: DC

## 2022-05-14 NOTE — MAU Note (Signed)
Report given to L&D charge RN. 

## 2022-05-14 NOTE — MAU Note (Signed)
Pt says UC started strong since 2pm [facility] on  [employer]- was here on Wed -  2 cm Denies HSV GBS- neg

## 2022-05-14 NOTE — Telephone Encounter (Signed)
Preadmission screen  

## 2022-05-14 NOTE — Telephone Encounter (Addendum)
Tried to call patient earlier today around 33 at number provided, family member stated she was on the way to the hospital as she was told baby needed to be delivered now.  Of note, patient seen in MAU on 05/12/22 and had some variable decelerations and admission was recommended, and she signed out AMA.  If patient calls back, although her BPP was 8/8 today, would recommend induction to patient based on previous variable decels. Per MFM, no signs of LGA infant and patient was told she should deliver between now and 41 WGA.  Burley Saver MD

## 2022-05-14 NOTE — Telephone Encounter (Signed)
Patient calls nurse line regarding BPP.She reports that she was told that her induction should be moved up due to baby's size.   Spoke with Dr. Miquel Dunn who is going to look into rescheduling induction.   Please call patient back at [phone] with update.   Thanks.   Veronda Prude, RN

## 2022-05-15 ENCOUNTER — Inpatient Hospital Stay (HOSPITAL_COMMUNITY): Payer: Medicaid Other | Admitting: Anesthesiology

## 2022-05-15 ENCOUNTER — Other Ambulatory Visit: Payer: Self-pay | Admitting: Advanced Practice Midwife

## 2022-05-15 ENCOUNTER — Inpatient Hospital Stay (HOSPITAL_COMMUNITY)
Admission: AD | Admit: 2022-05-15 | Discharge: 2022-05-18 | DRG: 806 | Disposition: A | Discharge status: Home / Self Care | Payer: Medicaid Other | Attending: Obstetrics & Gynecology | Admitting: Obstetrics & Gynecology

## 2022-05-15 DIAGNOSIS — O864 Pyrexia of unknown origin following delivery: Secondary | ICD-10-CM | POA: Diagnosis not present

## 2022-05-15 DIAGNOSIS — Z3A40 40 weeks gestation of pregnancy: Secondary | ICD-10-CM

## 2022-05-15 DIAGNOSIS — O480 Post-term pregnancy: Secondary | ICD-10-CM | POA: Diagnosis not present

## 2022-05-15 DIAGNOSIS — O479 False labor, unspecified: Secondary | ICD-10-CM | POA: Diagnosis present

## 2022-05-15 DIAGNOSIS — O403XX1 Polyhydramnios, third trimester, fetus 1: Secondary | ICD-10-CM | POA: Diagnosis not present

## 2022-05-15 DIAGNOSIS — O471 False labor at or after 37 completed weeks of gestation: Secondary | ICD-10-CM | POA: Diagnosis not present

## 2022-05-15 LAB — CBC
HCT: 38.9 % (ref 36.0–46.0)
Hemoglobin: 13 g/dL (ref 12.0–15.0)
MCH: 30 pg (ref 26.0–34.0)
MCHC: 33.4 g/dL (ref 30.0–36.0)
MCV: 89.6 fL (ref 80.0–100.0)
Platelets: 329 10*3/uL (ref 150–400)
RBC: 4.34 MIL/uL (ref 3.87–5.11)
RDW: 13.8 % (ref 11.5–15.5)
WBC: 11.4 10*3/uL — ABNORMAL HIGH (ref 4.0–10.5)
nRBC: 0 % (ref 0.0–0.2)

## 2022-05-15 LAB — ABO/RH: ABO/RH(D): B POS

## 2022-05-15 LAB — RPR: RPR Ser Ql: NONREACTIVE

## 2022-05-15 MED ORDER — OXYTOCIN BOLUS FROM INFUSION: 333.0000 mL | Freq: Once | INTRAVENOUS | Status: DC

## 2022-05-15 MED ORDER — OXYCODONE-ACETAMINOPHEN 5-325 MG PO TABS: 2.0000 | ORAL_TABLET | ORAL | Status: DC | PRN

## 2022-05-15 MED ORDER — OXYTOCIN-SODIUM CHLORIDE 30-0.9 UT/500ML-% IV SOLN: 1.0000 m[IU]/min | INTRAVENOUS | Status: DC

## 2022-05-15 MED ORDER — DIPHENHYDRAMINE HCL 50 MG/ML IJ SOLN: 12.5000 mg | INTRAMUSCULAR | Status: DC | PRN

## 2022-05-15 MED ORDER — OXYCODONE-ACETAMINOPHEN 5-325 MG PO TABS: 1.0000 | ORAL_TABLET | ORAL | Status: DC | PRN

## 2022-05-15 MED ORDER — FENTANYL-BUPIVACAINE-NACL 0.5-0.125-0.9 MG/250ML-% EP SOLN
12.0000 mL/h | EPIDURAL | Status: DC | PRN
  Filled 2022-05-15: qty 250

## 2022-05-15 MED ORDER — ACETAMINOPHEN 500 MG PO TABS
1000.0000 mg | ORAL_TABLET | Freq: Four times a day (QID) | ORAL | Status: DC | PRN
  Administered 2022-05-15: 1000 mg via ORAL
  Filled 2022-05-15: qty 2

## 2022-05-15 MED ORDER — LIDOCAINE HCL (PF) 1 % IJ SOLN
30.0000 mL | INTRAMUSCULAR | Status: AC | PRN
  Administered 2022-05-16: 30 mL via SUBCUTANEOUS
  Filled 2022-05-15: qty 30

## 2022-05-15 MED ORDER — SOD CITRATE-CITRIC ACID 500-334 MG/5ML PO SOLN: 30.0000 mL | ORAL | Status: DC | PRN

## 2022-05-15 MED ORDER — PHENYLEPHRINE 80 MCG/ML (10ML) SYRINGE FOR IV PUSH (FOR BLOOD PRESSURE SUPPORT): 80.0000 ug | PREFILLED_SYRINGE | INTRAVENOUS | Status: DC | PRN

## 2022-05-15 MED ORDER — LACTATED RINGERS IV SOLN: 500.0000 mL | Freq: Once | INTRAVENOUS | Status: DC

## 2022-05-15 MED ORDER — LACTATED RINGERS IV SOLN: INTRAVENOUS | Status: DC

## 2022-05-15 MED ORDER — EPHEDRINE 5 MG/ML INJ: 10.0000 mg | INTRAVENOUS | Status: DC | PRN

## 2022-05-15 MED ORDER — OXYTOCIN-SODIUM CHLORIDE 30-0.9 UT/500ML-% IV SOLN
2.5000 [IU]/h | INTRAVENOUS | Status: DC
Start: 2022-05-15 — End: 2022-05-16

## 2022-05-15 MED ORDER — TERBUTALINE SULFATE 1 MG/ML IJ SOLN: 0.2500 mg | Freq: Once | INTRAMUSCULAR | Status: DC | PRN

## 2022-05-15 MED ORDER — LACTATED RINGERS IV SOLN: 500.0000 mL | INTRAVENOUS | Status: DC | PRN

## 2022-05-15 MED ORDER — ACETAMINOPHEN 325 MG PO TABS
650.0000 mg | ORAL_TABLET | ORAL | Status: DC | PRN
Start: 2022-05-15 — End: 2022-05-16

## 2022-05-15 NOTE — H&P (Signed)
Rhonda Dixon is a 21 y.o. female presenting for SOL at [redacted]w[redacted]d.  Pregnancy complicated by mild polyhydramnios- resolved on [place] 03/25/22, and limited prenatal care, anxiety not on medication.   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 Needs repeat pap 10/2022.  Sono: @[redacted]w[redacted]d  EFW 2365g ,81%tile,  5lb 3oz  OB History     Gravida  1   Para      Term      Preterm      AB      Living         SAB      IAB      Ectopic      Multiple      Live Births                     Nursing Staff Provider  Office Location  [facility]  Dating  [facility]   Language   English Anatomy US     Flu Vaccine  Declined  Genetic Screen  NIPS:   AFP:   First Screen:  Quad:     TDaP vaccine   04/13/22 Hgb A1C or  GTT Early 76 passed Third trimester 116 passed  Rhogam    n/a   LAB RESULTS   Feeding Plan Breast and formula  Blood Type B/Positive/-- (01/06 1554)   Contraception None Antibody Comment, See Final Results (01/06 1554)  Circumcision yes Rubella 1.58 (01/06 1554)  Pediatrician    RPR Non Reactive (01/06 1554)   Support Person 01-25-1993 Braxton HBsAg Negative (01/06 1554)   Prenatal Classes   HIV Non Reactive (01/06 1554)  BTL Consent   GBS  (For PCN allergy, check sensitivities)   VBAC Consent   Pap  ASCUS, +high risk HPV (04/2021)  Resident PCP    Hgb Electro   normal  COVID Vax    CF    Hep C Antibody   negative SMA    Aspirin indicated   yes- not taking  Waterbirth  Refer to [facility] -> [ ]  Class [ ]  Consent [ ]  CNM visit     Past Medical History:  Diagnosis Date   Medical history non-contributory    Past Surgical History:  Procedure Laterality Date   NO PAST SURGERIES     Family History: family history includes Asthma in her mother; Autoimmune disease in her mother; Lupus in her mother. Social History:  reports that she has never smoked. She has never used smokeless tobacco. She reports that she  does not currently use alcohol. She reports that she does not use drugs.     Maternal Diabetes: No Genetic Screening: Normal Maternal Ultrasounds/Referrals: Normal Fetal Ultrasounds or other Referrals:  None Maternal Substance Abuse:  No Significant Maternal Medications:  None Significant Maternal Lab Results:  Group B Strep negative Number of Prenatal Visits:greater than 3 verified prenatal visits Other Comments:  None  Review of Systems  Constitutional: Negative.   Eyes:  Negative for visual disturbance.  Respiratory: Negative.  Negative for shortness of breath.   Cardiovascular:  Negative for leg swelling.  Genitourinary:  Negative for vaginal bleeding.  Neurological:  Negative for headaches.   Maternal Medical History:  Reason for admission: Contractions.   Contractions: Onset was 13-24 hours ago.   Frequency: irregular.   Duration is approximately -1 minutes.   Perceived severity is strong.   Fetal activity: Perceived fetal activity  is normal.   Last perceived fetal movement was within the past hour.   Prenatal Complications - Diabetes: none.     Last menstrual period 07/28/2021. Maternal Exam:  Uterine Assessment: Contraction strength is moderate.  Contraction duration is 3 minutes. Contraction frequency is regular.  Abdomen: Patient reports no abdominal tenderness. Fetal presentation: vertex Introitus: Normal vulva. Amniotic fluid character: not assessed. Pelvis: adequate for delivery.   Cervix: Cervix evaluated by digital exam.     Fetal Exam Fetal Monitor Review: Baseline rate: 145.  Variability: moderate (6-25 bpm).   Pattern: no accelerations.   Fetal State Assessment: Category I - tracings are normal.   Physical Exam Constitutional:      Appearance: Normal appearance. She is normal weight.  HENT:     Head: Normocephalic and atraumatic.  Eyes:     Extraocular Movements: Extraocular movements intact.  Genitourinary:    General: Normal vulva.   Musculoskeletal:        General: Normal range of motion.     Cervical back: Normal range of motion.  Neurological:     Mental Status: She is alert. Mental status is at baseline.  Psychiatric:        Mood and Affect: Mood normal.        Behavior: Behavior normal.     Prenatal labs: ABO, Rh: --/--/B POS Performed at [facility], [address], [place], [place] [address]  [phone]/02 0153) Antibody: POS (09/01 2137) Rubella: 1.58 (01/06 1554) RPR: NON REACTIVE (09/01 2136)  HBsAg: Negative (01/06 1554)  HIV: Non Reactive (08/01 1708)  GBS: Negative/-- (08/10 1219)   Assessment/Plan: Admit to L&D, will continue to support through spontaneous labor.  Pain control on request; options NO/IV fentanyl/Epidural AROM if desired, as appropriate Anticipate vaginal delivery  Circumcision: desires, having a female Feeding: both breast and formula Pediatrician: [facility] Contraception: undecided   Wyn Forster MD PGY3 05/15/2022, 10:48 PM  Attestation:  I confirm that I have verified the information documented in the resident's note and that I have also personally reperformed the physical exam and all medical decision making activities.   The patient was seen and examined by me also Agree with note NST reactive and reassuring UCs as listed Cervical exams as listed in note  Aviva Signs, CNM

## 2022-05-15 NOTE — MAU Note (Signed)
I have communicated with Cam Hai CNM and reviewed vital signs:  Vitals:   05/15/22 0920 05/15/22 0939  BP:  122/87  Pulse:  80  Resp:  18  Temp:  97.9 F (36.6 C)  SpO2: 100%     Vaginal exam:  Dilation: 4 Effacement (%): 80 Station: -2 Presentation: Vertex Exam by:: Kimberly,CNM,   Also reviewed contraction pattern and that non-stress test is reactive.  It has been documented that patient is contracting every 6-8 minutes with no cervical change over 13 hours not indicating active labor.  Patient denies any other complaints.  Based on this report provider has given order for discharge.  A discharge order and diagnosis entered by a provider.   Labor discharge instructions reviewed with patient.

## 2022-05-15 NOTE — MAU Note (Signed)
Here earlier 4cm sent home. Ctx stronger closer now. Reports sticky mucus discharge. Good fetal movement felt. No bleeding

## 2022-05-15 NOTE — Anesthesia Preprocedure Evaluation (Signed)
Anesthesia Evaluation  Patient identified by MRN, date of birth, ID band Patient awake    Reviewed: Allergy & Precautions, Patient's Chart, lab work & pertinent test results  History of Anesthesia Complications Negative for: history of anesthetic complications  Airway Mallampati: II  TM Distance: >3 FB Neck ROM: Full    Dental no notable dental hx.    Pulmonary neg pulmonary ROS,    Pulmonary exam normal        Cardiovascular negative cardio ROS Normal cardiovascular exam     Neuro/Psych negative neurological ROS  negative psych ROS   GI/Hepatic negative GI ROS, Neg liver ROS,   Endo/Other  negative endocrine ROS  Renal/GU negative Renal ROS  negative genitourinary   Musculoskeletal negative musculoskeletal ROS (+)   Abdominal   Peds  Hematology negative hematology ROS (+)   Anesthesia Other Findings Day of surgery medications reviewed with patient.  Reproductive/Obstetrics (+) Pregnancy                             Anesthesia Physical Anesthesia Plan  ASA: 2  Anesthesia Plan: Epidural   Post-op Pain Management:    Induction:   PONV Risk Score and Plan: Treatment may vary due to age or medical condition  Airway Management Planned: Natural Airway  Additional Equipment: Fetal Monitoring  Intra-op Plan:   Post-operative Plan:   Informed Consent: I have reviewed the patients History and Physical, chart, labs and discussed the procedure including the risks, benefits and alternatives for the proposed anesthesia with the patient or authorized representative who has indicated his/her understanding and acceptance.       Plan Discussed with:   Anesthesia Plan Comments:         Anesthesia Quick Evaluation  

## 2022-05-15 NOTE — Progress Notes (Signed)
    S: Rhonda Dixon is a 21 y.o. G1P0 at [redacted]w[redacted]d  who presents to MAU today complaining contractions q 8 minutes  She denies vaginal bleeding. She denies LOF. She reports normal fetal movement.  She has been in the MAU overnight awaiting a bed on L&D; she is feeling irreg ctx that are mild to mod in strength, but is wanting to be able to eat and sleep at home.  O: BP 119/84 (BP Location: Right Arm)   Pulse 91   Temp 98.6 F (37 C) (Oral)   Resp 16   Ht 5\' 4"  (1.626 m)   Wt 97.4 kg   LMP 07/28/2021 (Approximate)   SpO2 100%   BMI 36.87 kg/m  GENERAL: Well-developed, well-nourished female in no acute distress.  HEAD: Normocephalic, atraumatic.  CHEST: Normal effort of breathing, regular heart rate ABDOMEN: Soft, nontender, gravid  Cervical exam:  Dilation: 4 Effacement (%): 80 Station: -2 Presentation: Vertex Exam by:: Solash Tullo,CNM (unchanged since 2100 05/14/22)   Fetal Monitoring: (reviewed multiple hours of reactive, Cat 1 tracing overnight) Baseline: 130s Variability: + Accelerations: + Decelerations: none Contractions: irreg q 7-10 mins  BPP: 8/8   A: SIUP at [redacted]w[redacted]d  False labor  P: - D/C home to await labor, and has OB visit 05/18/22 as well as postdates IOL 05/21/22 prn -Given labor/bleeding/ROM precautions  07/21/22, CNM 05/15/2022 9:36 AM

## 2022-05-16 ENCOUNTER — Encounter (HOSPITAL_COMMUNITY): Payer: Self-pay | Admitting: Family Medicine

## 2022-05-16 DIAGNOSIS — O403XX1 Polyhydramnios, third trimester, fetus 1: Secondary | ICD-10-CM

## 2022-05-16 DIAGNOSIS — Z3A40 40 weeks gestation of pregnancy: Secondary | ICD-10-CM

## 2022-05-16 LAB — BPAM RBC
Blood Product Expiration Date: 202309212359
Blood Product Expiration Date: 202309222359
ISSUE DATE / TIME: 202309011506
Unit Type and Rh: 7300
Unit Type and Rh: 7300

## 2022-05-16 LAB — TYPE AND SCREEN
ABO/RH(D): B POS
Antibody Screen: POSITIVE
Unit division: 0
Unit division: 0

## 2022-05-16 LAB — RPR: RPR Ser Ql: NONREACTIVE

## 2022-05-16 MED ORDER — OXYTOCIN-SODIUM CHLORIDE 30-0.9 UT/500ML-% IV SOLN
INTRAVENOUS | Status: AC
  Filled 2022-05-16: qty 500

## 2022-05-16 MED ORDER — ONDANSETRON HCL 4 MG PO TABS: 4.0000 mg | ORAL_TABLET | ORAL | Status: DC | PRN

## 2022-05-16 MED ORDER — OXYTOCIN-SODIUM CHLORIDE 30-0.9 UT/500ML-% IV SOLN
1.0000 m[IU]/min | INTRAVENOUS | Status: DC
  Administered 2022-05-16: 2 m[IU]/min via INTRAVENOUS

## 2022-05-16 MED ORDER — COCONUT OIL OIL: 1.0000 | TOPICAL_OIL | Status: DC | PRN

## 2022-05-16 MED ORDER — DIPHENHYDRAMINE HCL 25 MG PO CAPS: 25.0000 mg | ORAL_CAPSULE | Freq: Four times a day (QID) | ORAL | Status: DC | PRN

## 2022-05-16 MED ORDER — ACETAMINOPHEN 325 MG PO TABS: 650.0000 mg | ORAL_TABLET | ORAL | Status: DC | PRN

## 2022-05-16 MED ORDER — SENNOSIDES-DOCUSATE SODIUM 8.6-50 MG PO TABS
2.0000 | ORAL_TABLET | ORAL | Status: DC
  Administered 2022-05-16 – 2022-05-18 (×3): 2 via ORAL
  Filled 2022-05-16 (×3): qty 2

## 2022-05-16 MED ORDER — FENTANYL-BUPIVACAINE-NACL 0.5-0.125-0.9 MG/250ML-% EP SOLN
EPIDURAL | Status: DC | PRN
  Administered 2022-05-15: 12 mL/h via EPIDURAL

## 2022-05-16 MED ORDER — ONDANSETRON HCL 4 MG/2ML IJ SOLN: 4.0000 mg | INTRAMUSCULAR | Status: DC | PRN

## 2022-05-16 MED ORDER — WITCH HAZEL-GLYCERIN EX PADS
1.0000 | MEDICATED_PAD | CUTANEOUS | Status: DC | PRN
  Administered 2022-05-16: 1 via TOPICAL

## 2022-05-16 MED ORDER — LIDOCAINE HCL (PF) 1 % IJ SOLN
INTRAMUSCULAR | Status: DC | PRN
  Administered 2022-05-15 (×2): 4 mL via EPIDURAL

## 2022-05-16 MED ORDER — IBUPROFEN 600 MG PO TABS
600.0000 mg | ORAL_TABLET | Freq: Four times a day (QID) | ORAL | Status: DC
  Administered 2022-05-16 – 2022-05-18 (×9): 600 mg via ORAL
  Filled 2022-05-16 (×9): qty 1

## 2022-05-16 MED ORDER — DIBUCAINE (PERIANAL) 1 % EX OINT: 1.0000 | TOPICAL_OINTMENT | CUTANEOUS | Status: DC | PRN

## 2022-05-16 MED ORDER — MEASLES, MUMPS & RUBELLA VAC IJ SOLR: 0.5000 mL | Freq: Once | INTRAMUSCULAR | Status: DC

## 2022-05-16 MED ORDER — TETANUS-DIPHTH-ACELL PERTUSSIS 5-2.5-18.5 LF-MCG/0.5 IM SUSY: 0.5000 mL | PREFILLED_SYRINGE | Freq: Once | INTRAMUSCULAR | Status: DC

## 2022-05-16 MED ORDER — ACETAMINOPHEN 500 MG PO TABS
1000.0000 mg | ORAL_TABLET | Freq: Once | ORAL | Status: AC
  Administered 2022-05-16: 1000 mg via ORAL
  Filled 2022-05-16: qty 2

## 2022-05-16 MED ORDER — BENZOCAINE-MENTHOL 20-0.5 % EX AERO
1.0000 | INHALATION_SPRAY | CUTANEOUS | Status: DC | PRN
  Administered 2022-05-16: 1 via TOPICAL
  Filled 2022-05-16: qty 56

## 2022-05-16 MED ORDER — SIMETHICONE 80 MG PO CHEW: 80.0000 mg | CHEWABLE_TABLET | ORAL | Status: DC | PRN

## 2022-05-16 MED ORDER — PRENATAL MULTIVITAMIN CH
1.0000 | ORAL_TABLET | Freq: Every day | ORAL | Status: DC
  Administered 2022-05-16 – 2022-05-18 (×3): 1 via ORAL
  Filled 2022-05-16 (×3): qty 1

## 2022-05-16 NOTE — Lactation Note (Signed)
This note was copied from a baby's chart. Lactation Consultation Note  Patient Name: Rhonda Dixon [id number] Date: 05/16/2022 Reason for consult: Initial assessment;Primapara;Term;1st time breastfeeding Age:22 hours   P1: Term infant at 40+2 weeks Feeding preference: Breast/formula  "Chozen" was beginning to arouse and show feeding cues when I arrived.  Taught hand expression and birth parent able to easily express colostrum drops which I finger fed to baby.  Also discussed spoon feed when she is able to express more. Breast feeding basics reviewed.  Assisted to latch in the football hold; few sucks noted before he became very drowsy.  Removed him from the breast and awakened.  Assisted to latch again, however, this time he was not at all interested in latching.  Swaddled per support person's request.  Suggested lots of STS, breast massage and hand expression.  Encouraged to call for latch assistance as needed.  Visitor present.  RN updated.   Maternal Data Has patient been taught Hand Expression?: Yes Does the patient have breastfeeding experience prior to this delivery?: No  Feeding Mother's Current Feeding Choice: Breast Milk and Formula  LATCH Score Latch: Repeated attempts needed to sustain latch, nipple held in mouth throughout feeding, stimulation needed to elicit sucking reflex.  Audible Swallowing: None  Type of Nipple: Everted at rest and after stimulation  Comfort (Breast/Nipple): Soft / non-tender  Hold (Positioning): Assistance needed to correctly position infant at breast and maintain latch.  LATCH Score: 6   Lactation Tools Discussed/Used    Interventions Interventions: Breast feeding basics reviewed;Assisted with latch;Skin to skin;Breast massage;Hand express;Adjust position;Support pillows;Position options;Expressed milk;Education;LC Services brochure  Discharge Pump: Personal  Consult Status Consult Status: Follow-up Date: 05/17/22 Follow-up type:  In-patient    Rhonda Dixon 05/16/2022, 1:47 PM

## 2022-05-16 NOTE — Lactation Note (Signed)
This note was copied from a baby's chart. Lactation Consultation Note  Patient Name: Rhonda Dixon [id number] Date: 05/16/2022 Reason for consult: L&D Initial assessment;Primapara;1st time breastfeeding;Term;Breastfeeding assistance (LC L/D visit, LC arrived at 30 mins, Birth parent not ready per midwife. LC visit at 10:15. baby STS, noted to be snorey. LC offered to assist, Birth parent receptive. LC assisted , baby mad a good effort , few sucks, did not sustain latch. STS.) Age:22 hours On and off attempt. LC used the bulb syringe to the mouth and nose. Still nasal congestion.  Birth parent aware she will be going to the 5 floor MBU and be seen by [facility].  Maternal Data    Feeding Mother's Current Feeding Choice: Breast Milk and Formula  LATCH Score Latch: Repeated attempts needed to sustain latch, nipple held in mouth throughout feeding, stimulation needed to elicit sucking reflex.  Audible Swallowing: None  Type of Nipple: Everted at rest and after stimulation (areola semi compressible)  Comfort (Breast/Nipple): Soft / non-tender  Hold (Positioning): Assistance needed to correctly position infant at breast and maintain latch.  LATCH Score: 6   Lactation Tools Discussed/Used    Interventions Interventions: Breast feeding basics reviewed;Assisted with latch;Skin to skin;Reverse pressure;Breast compression;Adjust position;Education  Discharge    Consult Status Consult Status: Follow-up from L&D Date: 05/16/22 Follow-up type: In-patient    Matilde Sprang Dorianne Perret 05/16/2022, 10:35 AM

## 2022-05-16 NOTE — Lactation Note (Signed)
This note was copied from a baby's chart. Lactation Consultation Note  Patient Name: Rhonda Dixon [id number] Date: 05/16/2022 Reason for consult: Follow-up assessment;Mother's request;Difficult latch;Primapara;1st time breastfeeding;Term;Breastfeeding assistance;RN request Age:22 hours Infant struggling to sustain the latch in different positions including cross cradle prone. He did a few sucks on and off for about 6 mins and spoon fed 4 ml.   Plan 1 To feed based on cues 8-12x 24hr period. Birth parent to offer breasts.  2. If unable to latch, hand express and offer colostrum on spoon, then try a latch.  All questions answered at the end of the visit.   Maternal Data Has patient been taught Hand Expression?: Yes Does the patient have breastfeeding experience prior to this delivery?: No  Feeding Mother's Current Feeding Choice: Breast Milk  LATCH Score Latch: Repeated attempts needed to sustain latch, nipple held in mouth throughout feeding, stimulation needed to elicit sucking reflex.  Audible Swallowing: A few with stimulation  Type of Nipple: Everted at rest and after stimulation  Comfort (Breast/Nipple): Soft / non-tender  Hold (Positioning): Assistance needed to correctly position infant at breast and maintain latch.  LATCH Score: 7   Lactation Tools Discussed/Used    Interventions Interventions: Breast feeding basics reviewed;Assisted with latch;Skin to skin;Breast massage;Hand express;Breast compression;Adjust position;Support pillows;Position options;Expressed milk;Education;LC Psychologist, educational;Visual merchandiser education  Discharge    Consult Status Consult Status: Follow-up Date: 05/17/22 Follow-up type: In-patient    Ardean Simonich  Nicholson-Springer 05/16/2022, 11:14 PM

## 2022-05-16 NOTE — Anesthesia Procedure Notes (Signed)
Epidural Patient location during procedure: OB Start time: 05/15/2022 11:39 PM End time: 05/15/2022 11:42 PM  Staffing Anesthesiologist: Kaylyn Layer, MD Performed: anesthesiologist   Preanesthetic Checklist Completed: patient identified, IV checked, risks and benefits discussed, monitors and equipment checked, pre-op evaluation and timeout performed  Epidural Patient position: sitting Prep: DuraPrep and site prepped and draped Patient monitoring: continuous pulse ox, blood pressure and heart rate Approach: midline Location: L3-L4 Injection technique: LOR air  Needle:  Needle type: Tuohy  Needle gauge: 17 G Needle length: 9 cm Needle insertion depth: 6 cm Catheter type: closed end flexible Catheter size: 19 Gauge Catheter at skin depth: 11 cm Test dose: negative and Other (1% lidocaine)  Assessment Events: blood not aspirated, injection not painful, no injection resistance, no paresthesia and negative IV test  Additional Notes Patient identified. Risks, benefits, and alternatives discussed with patient including but not limited to bleeding, infection, nerve damage, paralysis, failed block, incomplete pain control, headache, blood pressure changes, nausea, vomiting, reactions to medication, itching, and postpartum back pain. Confirmed with bedside nurse the patient's most recent platelet count. Confirmed with patient that they are not currently taking any anticoagulation, have any bleeding history, or any family history of bleeding disorders. Patient expressed understanding and wished to proceed. All questions were answered. Sterile technique was used throughout the entire procedure. Please see nursing notes for vital signs.   Crisp LOR on second attempt. Test dose was given through epidural catheter and negative prior to continuing to dose epidural or start infusion. Warning signs of high block given to the patient including shortness of breath, tingling/numbness in hands,  complete motor block, or any concerning symptoms with instructions to call for help. Patient was given instructions on fall risk and not to get out of bed. All questions and concerns addressed with instructions to call with any issues or inadequate analgesia.  Reason for block:procedure for pain

## 2022-05-16 NOTE — Discharge Summary (Signed)
Postpartum Discharge Summary  Date of Service updated***     Patient Name: Rhonda Dixon DOB: Oct 17, 2000 MRN: [id number]  Date of admission: 05/15/2022 Delivery date:05/16/2022  Delivering provider: Julianne Handler  Date of discharge: 05/16/2022  Admitting diagnosis: Uterine contractions during pregnancy [O47.9] Intrauterine pregnancy: [redacted]w[redacted]d     Secondary diagnosis:  Principal Problem:   Uterine contractions during pregnancy Active Problems:   SVD (spontaneous vaginal delivery)   Maternal fever during labor, delivered  Additional problems: abnormal papsmear    Discharge diagnosis: Term Pregnancy Delivered                                              Post partum procedures:{Postpartum procedures:23558} Augmentation: Pitocin Complications: None  Hospital course: Onset of Labor With Vaginal Delivery      21 y.o. yo G1P0 at [redacted]w[redacted]d was admitted in Active Labor on 05/15/2022. Patient had an uncomplicated labor course as follows:  Membrane Rupture Time/Date: 4:17 AM ,05/16/2022   Delivery Method:Vaginal, Spontaneous  Episiotomy: None  Lacerations:  Labial  Patient had an uncomplicated postpartum course.  She is ambulating, tolerating a regular diet, passing flatus, and urinating well. Patient is discharged home in stable condition on 05/16/22.  Newborn Data: Birth date:05/16/2022  Birth time:9:33 AM  Gender:Female  Living status:Living  Apgars:9 ,9  Weight:   Magnesium Sulfate received: No BMZ received: No Rhophylac:N/A MMR:N/A T-DaP:Given prenatally Flu: No Transfusion:{Transfusion received:30440034}  Physical exam  Vitals:   05/16/22 0802 05/16/22 0830 05/16/22 0900 05/16/22 0911  BP: 116/73 123/82 122/82   Pulse: 97 (!) 102 (!) 113   Resp: $Remo'18 18 18   'lGghv$ Temp:    (!) 100.7 F (38.2 C)  TempSrc:    Axillary  SpO2:       General: {Exam; general:21111117} Lochia: {Desc; appropriate/inappropriate:30686::"appropriate"} Uterine Fundus: {Desc; firm/soft:30687} Incision:  {Exam; incision:21111123} DVT Evaluation: {Exam; dvt:2111122} Labs: Lab Results  Component Value Date   WBC 11.4 (H) 05/15/2022   HGB 13.0 05/15/2022   HCT 38.9 05/15/2022   MCV 89.6 05/15/2022   PLT 329 05/15/2022      Latest Ref Rng & Units 12/29/2021    4:20 PM  CMP  Glucose 70 - 99 mg/dL 85   BUN 6 - 20 mg/dL <5   Creatinine 0.44 - 1.00 mg/dL 0.66   Sodium 135 - 145 mmol/L 136   Potassium 3.5 - 5.1 mmol/L 3.5   Chloride 98 - 111 mmol/L 107   CO2 22 - 32 mmol/L 23   Calcium 8.9 - 10.3 mg/dL 8.9   Total Protein 6.5 - 8.1 g/dL 6.0   Total Bilirubin 0.3 - 1.2 mg/dL 0.5   Alkaline Phos 38 - 126 U/L 39   AST 15 - 41 U/L 17   ALT 0 - 44 U/L 16    Edinburgh Score:     No data to display           After visit meds:  Allergies as of 05/16/2022   No Known Allergies   Med Rec must be completed prior to using this [facility]***        Discharge home in stable condition Infant Feeding: {Baby feeding:23562} Infant Disposition:{CHL IP OB HOME WITH OVFIEP:32951} Discharge instruction: per After Visit Summary and Postpartum booklet. Activity: Advance as tolerated. Pelvic rest for 6 weeks.  Diet: {OB OACZ:66063016} Future Appointments: Future Appointments  Date  Time Provider [place]  05/18/2022  1:30 PM ACCESS TO CARE POOL [facility] [place]   Follow up Visit:   Please schedule this patient for a In person postpartum visit in 6 weeks with the following provider: Any provider. Additional Postpartum F/U: none   Low risk pregnancy complicated by:  n/a Delivery mode:  Vaginal, Spontaneous  Anticipated Birth Control:   Declined   05/16/2022 Julianne Handler, CNM

## 2022-05-16 NOTE — Progress Notes (Signed)
Patient ID: Rhonda Dixon, female   DOB: 02-16-2001, 21 y.o.   MRN: [id number] Doing well but contractions are spacing out  Vitals:   05/16/22 0600 05/16/22 0630 05/16/22 0658 05/16/22 0700  BP: 121/80 106/67  111/66  Pulse: 99 (!) 102  97  Temp:   99 F (37.2 C)   TempSrc:   Oral   SpO2:       FHR 140s, average variability, small accels UCs irregular  Dilation: 6 Effacement (%): 90 Cervical Position: Middle Station: -1 Presentation: Vertex Exam by:: K.Louis-Charles, RN  Will Augment her labor with Pitocin 2x2

## 2022-05-16 NOTE — Anesthesia Postprocedure Evaluation (Signed)
Anesthesia Post Note  Patient: Sylvan T Hight  Procedure(s) Performed: AN AD HOC LABOR EPIDURAL     Patient location during evaluation: Mother Baby Anesthesia Type: Epidural Level of consciousness: awake and alert and oriented Pain management: satisfactory to patient Vital Signs Assessment: post-procedure vital signs reviewed and stable Respiratory status: respiratory function stable Cardiovascular status: stable Postop Assessment: no headache, no backache, epidural receding, patient able to bend at knees, no signs of nausea or vomiting, adequate PO intake and able to ambulate Anesthetic complications: no   No notable events documented.  Last Vitals:  Vitals:   05/16/22 1240 05/16/22 1605  BP: 115/67 115/67  Pulse: 90 93  Resp: 18 17  Temp: 36.8 C 36.8 C  SpO2: 99% 100%    Last Pain:  Vitals:   05/16/22 1610  TempSrc:   PainSc: 0-No pain   Pain Goal:                   Rydell Wiegel

## 2022-05-17 MED ORDER — IBUPROFEN 600 MG PO TABS: 600.0000 mg | ORAL_TABLET | Freq: Four times a day (QID) | ORAL | 0 refills | Status: DC

## 2022-05-17 NOTE — Progress Notes (Signed)
Joellyn Haff called to cancel DC on patient . However order could not be found . It says active . K Booker aware that I could not find the actual order either.  Patient wants to bottle feed and breast. Supplement guidelines given. Infant did not latch so I told her to feed 15 to 30cc now since infant is over 24hrs now.

## 2022-05-17 NOTE — Lactation Note (Signed)
This note was copied from a baby's chart. Lactation Consultation Note  Patient Name: Rhonda Dixon [id number] Date: 05/17/2022 Reason for consult: Follow-up assessment;Mother's request;Difficult latch;Primapara;1st time breastfeeding;Term;Infant weight loss;Breastfeeding assistance (3.99% WL) Age:22 hours  P1, Term, Infant Female, 3.99% WL  Infant at 31 hours.  LC entered the room and the birth parent was holding the sleeping infant.  The birth parent states that she has been having issues with latching baby and started formula feeding.  Per the birth parent, baby took 56mL of formula before LC entered the room.  The birth parent states that she would like to continue breastfeeding.  The birth parent asked for mouthwash. LC got mouthwash for the parent and checked with the RN to make sure that the mouth wash was ok for the parent.  All questions were answered.  The birth parent will call when she is ready to latch baby.    Feeding Mother's Current Feeding Choice: Breast Milk and Formula   Interventions Interventions: Breast feeding basics reviewed;Education  Discharge    Consult Status Consult Status: Follow-up Date: 05/18/22 Follow-up type: In-patient    Rhonda Dixon 05/17/2022, 5:01 PM

## 2022-05-17 NOTE — Social Work (Signed)
CSW received consult for hx of Anxiety. CSW met with MOB to offer support and complete assessment. CSW entered the room, introduced self, CSW role and reason for visit. MOB agreeable to visit. MOB was welcoming and easy to engage. CSW inquired about how MOB was feeling, MOB states he was sore but doing well. MOB reported her delivery was not as bad as she was expecting it to be. CSW congratulated MOB on baby "Chozen". CSW inquired about MOB's MH hx. CSW reported she was diagnosed with anxiety at age 43 or 60. MOB repots she was prescribed medicine but stopped taking it because she did not like the way it made her feel. MOB reported she has had a stable mood and no symptoms of anxiety throughout her pregnancy. MOB reported her anxiety is more situational. CSW assessed for safety, MOB denied any SI, HI or DV. MOB identified FOB and her mom as her supports. CSW provided education regarding the baby blues period vs. perinatal mood disorders, discussed treatment and gave resources for mental health follow up if concerns arise.  CSW recommends self-evaluation during the postpartum time period using the New Mom Checklist from Postpartum Progress and encouraged MOB to contact a medical professional if symptoms are noted at any time.    CSW provided review of Sudden Infant Death Syndrome (SIDS) precautions. MOB reported she has all necessary items for the infant including a crib for him to sleep.   CSW identifies no further need for intervention and no barriers to discharge at this time.   Letta Kocher, [place] Social Worker [phone]

## 2022-05-18 ENCOUNTER — Telehealth: Payer: Self-pay

## 2022-05-18 NOTE — Lactation Note (Addendum)
This note was copied from a baby's chart. Lactation Consultation Note  Patient Name: Rhonda Dixon [id number] Date: 05/18/2022 Reason for consult: Mother's request;Follow-up assessment (-4% weight loss), LC request by Rhonda Dixon tonight. Age:22 hours, P1, term female infant with -4% weight loss. Per Rhonda Dixon she desires to use DEBP but having pain when pumping, LC re-fitted Rhonda Dixon with 24 mm breast flange per Rhonda Dixon,  no pain when pumping now . Rhonda Dixon was expressing colostrum in breast flange as LC left the room. Rhonda Dixon know to finger feed or spoon feed infant any EBM that is expressed in breast flanges, that EBM is safe for 4 hours at room temperature whereas formula is 1 hour. Per Rhonda Dixon,   she has mostly been formula feeding infant  for the past 4 feedings due infant not latching at the breast. Per Rhonda Dixon, she feels frustrated when she tries to latch infant and infant will not latch. LC unable assist with latch at this time due infant having formula prior to [facility] entering the room. Rhonda Dixon will continue to feed infant according to hunger cues, on demand, 8 to 12x with 24 hrs, STS. LC discussed with Rhonda Dixon if it is her desire to continue to BF to ask RN/LC for latch assistance with next feeding. Rhonda Dixon will continue to use DEBP every 3 hours for 15 minutes on initial setting and give infant back any EBM first before supplementing infant with formula.   Maternal Data    Feeding Mother's Current Feeding Choice: Breast Milk and Formula Nipple Type: Slow - flow  LATCH Score                    Lactation Tools Discussed/Used    Interventions Interventions: Position options;Skin to skin;Education;DEBP  Discharge    Consult Status Consult Status: Follow-up Date: 05/18/22 Follow-up type: In-patient    Danelle Earthly 05/18/2022, 12:12 AM

## 2022-05-18 NOTE — Lactation Note (Signed)
This note was copied from a baby's chart. Lactation Consultation Note  Patient Name: Rhonda Dixon [id number] Date: 05/18/2022 Reason for consult: Follow-up assessment;Primapara;Term;1st time breastfeeding Age:22 hours   P1: Term infant ant 40+2 weeks Feeding preference: Breast/formula  "Chozen" was swaddled and asleep when I arrived.  He last fed 59 mls of formula approximately 2-1/2 hours ago.  Discussed birth parent's feeding plan since she has been providing a lot of formula.  She informed me that she will still "try" to breast feed, however, she now desires to pump and bottle feed.  Emphasized the importance of beginning to pump every three hours around the clock.  She should continue breast massage and hand expression before/after pumping.  She is currently able to produce 3 mls at her last pumping session.  When she is ready to attempt latching I encouraged her to latch prior to giving any formula supplementation.  She is aware to give her expressed breast milk before giving formula.  Support person is on his way to the hospital now to show identification for the birth certificate.  Called birth registry to alert them.   Maternal Data    Feeding Mother's Current Feeding Choice: Breast Milk and Formula Nipple Type: Slow - flow  LATCH Score                    Lactation Tools Discussed/Used    Interventions    Discharge Discharge Education: Engorgement and breast care Pump: Personal (Lansinoh)  Consult Status Consult Status: Complete Date: 05/18/22 Follow-up type: Call as needed    Hikeem Andersson R Majesty Oehlert 05/18/2022, 8:04 AM

## 2022-05-18 NOTE — Telephone Encounter (Signed)
-----   Message from Levin Erp, MD sent at 05/18/2022 11:12 AM EDT ----- Can you schedule patient for postpartum visit? Thank you,  Mayuri

## 2022-05-18 NOTE — Telephone Encounter (Signed)
Appointment made for 06/14/22 for PP visit. With Dr. Laroy Apple. Aquilla Solian, CMA

## 2022-05-18 NOTE — Discharge Summary (Signed)
Postpartum Discharge Summary                                Patient Name: Rhonda Dixon DOB: 11-09-2000 MRN: [id number]   Date of admission: 05/15/2022 Delivery date:05/16/2022  Delivering provider: Julianne Handler  Date of discharge: 05/17/2022   Admitting diagnosis: Uterine contractions during pregnancy [O47.9] Intrauterine pregnancy: [redacted]w[redacted]d     Secondary diagnosis:  Principal Problem:   Uterine contractions during pregnancy Active Problems:   SVD (spontaneous vaginal delivery)   Maternal fever during labor, delivered   Additional problems: abnormal papsmear                           Discharge diagnosis: Term Pregnancy Delivered                                              Post partum procedures: none Augmentation: Pitocin Complications: None   Hospital course: Onset of Labor With Vaginal Delivery      21 y.o. yo G1P0 at [redacted]w[redacted]d was admitted in Active Labor on 05/15/2022. Patient had an uncomplicated labor course as follows:  Membrane Rupture Time/Date: 4:17 AM ,05/16/2022   Delivery Method:Vaginal, Spontaneous  Episiotomy: None  Lacerations:  Labial  Patient had an uncomplicated postpartum course.  She is ambulating, tolerating a regular diet, passing flatus, and urinating well. Patient is discharged home in stable condition on 05/17/22.   Newborn Data: Birth date:05/16/2022  Birth time:9:33 AM  Gender:Female  Living status:Living  Apgars:9 ,9  Weight:3635 g    Magnesium Sulfate received: No BMZ received: No Rhophylac:N/A MMR:N/A T-DaP:Given prenatally Flu: No Transfusion:No   Physical exam        Vitals:    05/16/22 1605 05/16/22 2039 05/17/22 0100 05/17/22 0529  BP: 115/67 110/71 112/81 112/77  Pulse: 93 80 87 76  Resp: $Remo'17 18 18 18  'lwoJr$ Temp: 98.2 F (36.8 C) 98.2 F (36.8 C) 98.2 F (36.8 C) 97.9 F (36.6 C)  TempSrc: Oral Oral Oral Oral  SpO2: 100% 100% 100% 100%    General: alert, cooperative, and no distress Lochia: appropriate Uterine Fundus:  firm Incision: N/A DVT Evaluation: No evidence of DVT seen on physical exam. Labs: Recent Labs       Lab Results  Component Value Date    WBC 11.4 (H) 05/15/2022    HGB 13.0 05/15/2022    HCT 38.9 05/15/2022    MCV 89.6 05/15/2022    PLT 329 05/15/2022          Latest Ref Rng & Units 12/29/2021    4:20 PM  CMP  Glucose 70 - 99 mg/dL 85   BUN 6 - 20 mg/dL <5   Creatinine 0.44 - 1.00 mg/dL 0.66   Sodium 135 - 145 mmol/L 136   Potassium 3.5 - 5.1 mmol/L 3.5   Chloride 98 - 111 mmol/L 107   CO2 22 - 32 mmol/L 23   Calcium 8.9 - 10.3 mg/dL 8.9   Total Protein 6.5 - 8.1 g/dL 6.0   Total Bilirubin 0.3 - 1.2 mg/dL 0.5   Alkaline Phos 38 - 126 U/L 39   AST 15 - 41 U/L 17   ALT 0 - 44 U/L 16     Edinburgh Score:     05/16/2022  11:30 AM  Edinburgh Postnatal Depression Scale Screening Tool  I have been able to laugh and see the funny side of things. 0  I have looked forward with enjoyment to things. 0  I have blamed myself unnecessarily when things went wrong. 1  I have been anxious or worried for no good reason. 1  I have felt scared or panicky for no good reason. 1  Things have been getting on top of me. 2  I have been so unhappy that I have had difficulty sleeping. 2  I have felt sad or miserable. 0  I have been so unhappy that I have been crying. 0  The thought of harming myself has occurred to me. 0  Edinburgh Postnatal Depression Scale Total 7        After visit meds:  Allergies as of 05/17/2022   No Known Allergies         Medication List       STOP taking these medications     acetaminophen 500 MG tablet Commonly known as: TYLENOL           TAKE these medications     ibuprofen 600 MG tablet Commonly known as: ADVIL Take 1 tablet (600 mg total) by mouth every 6 (six) hours.    Prenatal Vitamins 28-0.8 MG Tabs Take 1 each by mouth daily.               Discharge home in stable condition Infant Feeding: Breast Infant Disposition:home with  mother Discharge instruction: per After Visit Summary and Postpartum booklet. Activity: Advance as tolerated. Pelvic rest for 6 weeks.  Diet: routine diet Future Appointments: No future appointments.   Follow up Visit:   [place]. Schedule an appointment as soon as possible for a visit in 4 week(s).   Specialty: Family Medicine Contact information: [address] [phone] [place] [phone]                          Please schedule this patient for a In person postpartum visit in 6 weeks with the following provider: Any provider. Additional Postpartum F/U: none   Low risk pregnancy complicated by:  n/a Delivery mode:  Vaginal, Spontaneous  Anticipated Birth Control:   Declined     05/17/2022 Hansel Feinstein, CNM

## 2022-05-19 LAB — TYPE AND SCREEN
ABO/RH(D): B POS
Antibody Screen: NEGATIVE
Unit division: 0
Unit division: 0

## 2022-05-19 LAB — BPAM RBC
Blood Product Expiration Date: 202309212359
Blood Product Expiration Date: 202309222359
ISSUE DATE / TIME: 202309011506
Unit Type and Rh: 7300
Unit Type and Rh: 7300

## 2022-05-21 ENCOUNTER — Other Ambulatory Visit: Payer: Self-pay | Admitting: Student

## 2022-05-21 ENCOUNTER — Inpatient Hospital Stay (HOSPITAL_COMMUNITY): Payer: Medicaid Other

## 2022-05-21 ENCOUNTER — Ambulatory Visit: Payer: Medicaid Other

## 2022-05-21 ENCOUNTER — Inpatient Hospital Stay (HOSPITAL_COMMUNITY)
Admission: AD | Admit: 2022-05-21 | Payer: Medicaid Other | Source: Home / Self Care | Admitting: Obstetrics and Gynecology

## 2022-05-21 ENCOUNTER — Telehealth: Payer: Self-pay

## 2022-05-21 NOTE — Progress Notes (Signed)
Called listed number, patient to call back to discuss how much length of time off she would like on FMLA paperwork

## 2022-05-21 NOTE — Telephone Encounter (Signed)
Patient LVM on nurse line requesting a call back.   Called patient, however no answer or option for VM.   The phone just rang and rang.

## 2022-05-27 ENCOUNTER — Telehealth (HOSPITAL_COMMUNITY): Payer: Self-pay | Admitting: *Deleted

## 2022-05-27 NOTE — Telephone Encounter (Signed)
Voicemail not setup. Unable to leave message.  Duffy Rhody, RN 05-27-2022 at 10:52am

## 2022-06-13 NOTE — Patient Instructions (Signed)
It was great to see you! Thank you for allowing me to participate in your care!   Our plans for today:  -Lets schedule another visit in 2-4 weeks to check in on how you are doing with taking care of yourself - I will prescribe some voltaren gel for your back and you may try aspercreme at home   If you are feeling suicidal or depression symptoms worsen please immediately go to:   If you are thinking about harming yourself or having thoughts of suicide, or if you know someone who is, seek help right away. If you are in crisis, make sure you are not left alone.  If someone else is in crisis, make sure he/she/they is not left alone  Call 988 OR 1-800-273-TALK  24 Hour Availability for Walk-IN services  [facility]  [address] [place], [place] [phone] Crisis [phone]    Other crisis resources:  [facility] [employer] (Domestic Violence, Rape & Victim Assistance [phone]  [facility]    (ONLY from 8am-4pm)    [phone]  [facility] (24/7)   [phone]  [place] National Suicide Hotline   [phone] (TALK)  Take care and seek immediate care sooner if you develop any concerns.  Levin Erp, MD

## 2022-06-13 NOTE — Progress Notes (Unsigned)
  [place] Postpartum Visit   Rhonda Dixon is a 21 y.o. G1P1001 presenting for a postpartum visit.  She has the following concerns today: abdominal cramping around 2/10, patient has been feeling down and extremely tired and not as hungry. Back pain worse when back not against anything.  She delivered via SVD at 40.2. She reports her vaginal bleeding is gone. She stopped breast feeding 1 week ago because she was feeling low. She is formula feeding her infant now but interested in restarting pumping breast milk. She has been self expressing in the shower and believes her milk supply is still prsent. She feels she is bonding well. She is not wanting contraception, currently abstinent  Edinburgh Postnatal Depression Scale: 12 (10 or higher is positive)  Reviewed pregnancy and delivery course.  -SVD Vitals:   06/14/22 1333  BP: 110/64  Pulse: 74  SpO2: 99%   General: NAD, awake, alert, responsive to questions Head: Normocephalic atraumatic CV: Regular rate and rhythm no murmurs rubs or gallops Respiratory: Clear to ausculation bilaterally, no wheezes rales or crackles, chest rises symmetrically,  no increased work of breathing Abdomen: Soft, non-tender, non-distended, normoactive bowel sounds  Extremities: Moves upper and lower extremities freely, no edema in LE  A/P:  Postpartum visit: patient is 4 weeks postpartum following a vaginal delivery. -Discussed patients delivery and complications -Patient had a bilateral labial laceration, perineal healing reviewed. A little itchy. Patient expressed understanding -Patient has urinary incontinence? No -Patient is safe to resume physical and sexual activity-we discussed can be closer to 6-8 weeks if still feeling sore -Patient does not want a pregnancy in the next year.  Desired family size is unknown children.  -Reviewed forms of contraception in tiered fashion. Patient desired no method today.   -Return to sexual activity  and contraception discussed as above.  -Discussed birth spacing of 18 months -Breastfeeding: No - 2 weeks has not been pumping provided support of decision and resources as indicated  -Mood: Feeling sleep deprived and sad a lot of times. She is excited to restart her job and would like a work note for tomorrow.  She has previously been on Prozac and Lexapro around 2020/2021.  She says that these were not helpful for her.  She does not want to try any oral medication for postpartum depression.  She is not interested in therapy currently as well.  I provided resources on AVS and discussed that we can have frequent follow-up here about her mood.  I have also given her a letter to return to work as well since she feels like this will help with her mood as well by getting out of the house.  She has boyfriends dad who can take care of baby while she is at work. -Discussed sleep and fatigue management and encouraged family/community support.  -Reviewed prior Pap (LSIL with few cells suggestive of higher grade lesion 04/2022) and she is next due for Pap 10/2022.  -Some back pain which she is complaining of, has been taking ibuprofen as needed, prescribed some Voltaren gel to place on her back as well, we will continue to monitor.  No red flag symptoms on examination -Return in 2-4 weeks for check on how patient's mood is

## 2022-06-14 ENCOUNTER — Ambulatory Visit (INDEPENDENT_AMBULATORY_CARE_PROVIDER_SITE_OTHER): Payer: Medicaid Other | Admitting: Student

## 2022-06-14 ENCOUNTER — Encounter: Payer: Self-pay | Admitting: Student

## 2022-06-14 VITALS — BP 110/64 | HR 74 | Wt 189.0 lb

## 2022-06-14 DIAGNOSIS — M545 Low back pain, unspecified: Secondary | ICD-10-CM | POA: Diagnosis not present

## 2022-06-14 MED ORDER — DICLOFENAC SODIUM 1 % EX GEL: 2.0000 g | Freq: Four times a day (QID) | CUTANEOUS | 0 refills | Status: DC

## 2022-10-11 ENCOUNTER — Ambulatory Visit (INDEPENDENT_AMBULATORY_CARE_PROVIDER_SITE_OTHER): Payer: Medicaid Other | Admitting: Family Medicine

## 2022-10-11 VITALS — BP 102/60 | HR 70 | Ht 64.0 in | Wt 198.2 lb

## 2022-10-11 DIAGNOSIS — B001 Herpesviral vesicular dermatitis: Secondary | ICD-10-CM

## 2022-10-11 HISTORY — DX: Herpesviral vesicular dermatitis: B00.1

## 2022-10-11 MED ORDER — VALACYCLOVIR HCL 1 G PO TABS: 2000.0000 mg | ORAL_TABLET | Freq: Two times a day (BID) | ORAL | 0 refills | Status: AC

## 2022-10-11 NOTE — Progress Notes (Signed)
    SUBJECTIVE:   CHIEF COMPLAINT / HPI:   Lesion of lip Rhonda Dixon is a 22 year old woman who presents today for concern of fever blister.  She reports will and duration of a lesion on her right lateral upper lip at the vermilion border.  She reports prior history of fever blisters at the rate of 1 or 2 every year or so.  She has been using over-the-counter Abreva on the area 5 times daily as instructed on the packaging, followed by Vaseline for moisturizing.  She reports lesion has improved significantly, but she is concerned as it is still not totally resolved and is still little bit painful.  No other lesions of the mouth, lips, hands, feet, or other areas.  Denies concern for STI exposure.  PERTINENT  PMH / PSH:  Patient Active Problem List   Diagnosis Date Noted   Cold sore 10/11/2022   History of abnormal cervical Pap smear 04/28/2022   Generalized anxiety disorder 10/23/2018   ALLERGIC RHINITIS, SEASONAL 04/01/2009    OBJECTIVE:   BP 102/60   Pulse 70   Ht 5\' 4"  (1.626 m)   Wt 198 lb 3.2 oz (89.9 kg)   LMP 09/13/2022 (Approximate)   SpO2 97%   BMI 34.02 kg/m    PHQ-9:     10/11/2022    4:03 PM 06/14/2022    1:35 PM 05/07/2022   11:34 AM  Depression screen PHQ 2/9  Decreased Interest 0 0 0  Down, Depressed, Hopeless 1 0 0  PHQ - 2 Score 1 0 0  Altered sleeping 3 3 2   Tired, decreased energy 3 3 2   Change in appetite 2 1 1   Feeling bad or failure about yourself  1 0 0  Trouble concentrating 1 0 0  Moving slowly or fidgety/restless 1 0 0  Suicidal thoughts 0 0 0  PHQ-9 Score 12 7 5   Difficult doing work/chores Somewhat difficult  Not difficult at all    Physical Exam General: Awake, alert, oriented HEENT: PERRL, bilateral TM pearly pink and flat, bilateral external auditory canals with minimal cerumen burden,  nasal mucosa slightly edematous, oral mucosa pink, moist, without lesion, intact dentition without obvious cavity, clumped scabbed erythematous lesions  with couple satellite lesions of right lateral upper lip at the vermilion border, no discharge present Lymph: No palpable lymphedema of head or neck Respiratory: Speaking clearly in full sentences, no respiratory distress   ASSESSMENT/PLAN:   Cold sore Rx valacyclovir 2000 mg twice daily x 2 doses.  Return precautions given.  If this does not clear up with valacyclovir, would consider STI swab of mouth and throat.     Ezequiel Essex, MD [place]

## 2022-10-11 NOTE — Patient Instructions (Signed)
It was wonderful to see you today. Thank you for allowing me to be a part of your care. Below is a short summary of what we discussed at your visit today:  Cold sore Take Valacyclovir: 2 g (two pills) morning and night for one day You may continue the topical abreva treatment if you would like, but don't have to Continue moisturizing  Do not share drinks, straws, plates, utensils, food etc. with anybody until this cold sore has improved    Please bring all of your medications to every appointment!  If you have any questions or concerns, please do not hesitate to contact us via phone or MyChart message.   Ezequiel Essex, MD

## 2022-10-11 NOTE — Assessment & Plan Note (Signed)
Rx valacyclovir 2000 mg twice daily x 2 doses.  Return precautions given.  If this does not clear up with valacyclovir, would consider STI swab of mouth and throat.

## 2022-11-22 ENCOUNTER — Ambulatory Visit: Payer: Medicaid Other | Admitting: Student

## 2022-11-23 ENCOUNTER — Ambulatory Visit: Payer: Medicaid Other

## 2022-11-25 ENCOUNTER — Encounter (HOSPITAL_COMMUNITY): Payer: Self-pay | Admitting: *Deleted

## 2022-11-25 ENCOUNTER — Other Ambulatory Visit: Payer: Self-pay

## 2022-11-25 ENCOUNTER — Ambulatory Visit (HOSPITAL_COMMUNITY)
Admission: EM | Admit: 2022-11-25 | Discharge: 2022-11-25 | Disposition: A | Discharge status: Home / Self Care | Payer: Medicaid Other | Attending: Sports Medicine | Admitting: Sports Medicine

## 2022-11-25 ENCOUNTER — Ambulatory Visit: Payer: Medicaid Other | Admitting: Student

## 2022-11-25 DIAGNOSIS — Z3201 Encounter for pregnancy test, result positive: Secondary | ICD-10-CM | POA: Diagnosis not present

## 2022-11-25 DIAGNOSIS — J029 Acute pharyngitis, unspecified: Secondary | ICD-10-CM

## 2022-11-25 LAB — POC URINE PREG, ED: Preg Test, Ur: POSITIVE — AB

## 2022-11-25 MED ORDER — PYRIDOXINE HCL 10 MG PO TABS: 25.0000 mg | ORAL_TABLET | Freq: Every day | ORAL | 0 refills | Status: DC

## 2022-11-25 NOTE — ED Provider Notes (Signed)
[place]    CSN: [id number] Arrival date & time: 11/25/22  1011      History   Chief Complaint Chief Complaint  Patient presents with   Hoarse   Cough   Sore Throat   Nausea   Abdominal Pain    HPI Rhonda Dixon is a 22 y.o. female.   She is here today with chief complaint of sore throat, nausea and abdominal pain for the past week.  She reports she has continued to have nausea for the past week and started having a sore throat a couple days ago.  She reports it is difficult secondary to the pain when even swallowing her own spit.  She denies any fevers, chills, nasal congestion or runny nose. She reports her last menstrual period was last month and that she has had to just prior to that since her pregnancy.  She is not currently on any contraception and she and her current child's father do not use condoms during sexual intercourse.   Cough Sore Throat Associated symptoms include abdominal pain.  Abdominal Pain Associated symptoms: cough     Past Medical History:  Diagnosis Date   Medical history non-contributory     Patient Active Problem List   Diagnosis Date Noted   Cold sore 10/11/2022   History of abnormal cervical Pap smear 04/28/2022   Generalized anxiety disorder 10/23/2018   ALLERGIC RHINITIS, SEASONAL 04/01/2009    Past Surgical History:  Procedure Laterality Date   NO PAST SURGERIES      OB History     Gravida  1   Para  1   Term  1   Preterm      AB      Living  1      SAB      IAB      Ectopic      Multiple  0   Live Births  1            Home Medications    Prior to Admission medications   Medication Sig Start Date End Date Taking? Authorizing Provider  pyridOXINE 10 MG TABS Take 25 mg by mouth daily. 11/25/22  Yes Rodena Goldmann A, DO  escitalopram (LEXAPRO) 10 MG tablet Take 0.5 tablets (5 mg total) by mouth daily. 10/23/18 01/17/20  Kathrene Alu, MD  FLUoxetine (PROZAC) 20 MG tablet Take 1  tablet (20 mg total) by mouth daily. 12/18/19 01/17/20  Matilde Haymaker, MD  ipratropium (ATROVENT) 0.06 % nasal spray Place 2 sprays into both nostrils 4 (four) times daily. 07/26/18 01/17/20  Ok Edwards, PA-C  omeprazole (PRILOSEC) 20 MG capsule Take 1 capsule (20 mg total) by mouth daily. 08/29/18 06/26/19  Steve Rattler, DO    Family History Family History  Problem Relation Age of Onset   Asthma Mother    Lupus Mother    Autoimmune disease Mother     Social History Social History   Tobacco Use   Smoking status: Never   Smokeless tobacco: Never  Vaping Use   Vaping Use: Former   Substances: Nicotine, Flavoring  Substance Use Topics   Alcohol use: Not Currently    Comment: occ   Drug use: No     Allergies   Patient has no known allergies.   Review of Systems Review of Systems  Respiratory:  Positive for cough.   Gastrointestinal:  Positive for abdominal pain.     Physical Exam Triage Vital Signs ED Triage Vitals  Enc Vitals Group     BP 11/25/22 1039 110/75     Pulse Rate 11/25/22 1039 87     Resp 11/25/22 1039 18     Temp 11/25/22 1039 98.6 F (37 C)     Temp src --      SpO2 11/25/22 1039 96 %     Weight --      Height --      Head Circumference --      Peak Flow --      Pain Score 11/25/22 1037 5     Pain Loc --      Pain Edu? --      Excl. in [place]? --    No data found.  Updated Vital Signs BP 110/75   Pulse 87   Temp 98.6 F (37 C)   Resp 18   LMP  (LMP Unknown)   SpO2 96%   Breastfeeding No   Physical Exam Vitals reviewed.  Constitutional:      General: She is not in acute distress.    Appearance: She is well-developed and normal weight. She is not ill-appearing, toxic-appearing or diaphoretic.  HENT:     Head: Normocephalic.     Mouth/Throat:     Mouth: Mucous membranes are moist. No oral lesions.     Pharynx: Posterior oropharyngeal erythema present. No pharyngeal swelling or oropharyngeal exudate.  Cardiovascular:     Rate and  Rhythm: Normal rate.  Pulmonary:     Effort: Pulmonary effort is normal.  Abdominal:     Palpations: Abdomen is soft.  Skin:    General: Skin is warm.  Neurological:     Mental Status: She is alert.  Psychiatric:        Mood and Affect: Mood normal.        Behavior: Behavior normal.      UC Treatments / Results  Labs (all labs ordered are listed, but only abnormal results are displayed) Labs Reviewed  POC URINE PREG, ED - Abnormal; Notable for the following components:      Result Value   Preg Test, Ur POSITIVE (*)    All other components within normal limits    EKG   Radiology No results found.  Procedures Procedures (including critical care time)  Medications Ordered in UC Medications - No data to display  Initial Impression / Assessment and Plan / UC Course  I have reviewed the triage vital signs and the nursing notes.  Pertinent labs & imaging results that were available during my care of the patient were reviewed by me and considered in my medical decision making (see chart for details).     Viral pharyngitis, no exudate seen today or enlarged tonsils.  Recommend she use over-the-counter throat spray and throat lozenges. For her positive urine pregnancy test patient reports her last menstrual period was earlier last month 10/2022.  Recommend she restart her prenatal vitamins and avoid any other prescription or over-the-counter medications.  Follow-up with her primary care provider or OB/GYN.  She verbalized understanding. Final Clinical Impressions(s) / UC Diagnoses   Final diagnoses:  Pharyngitis, unspecified etiology  Encounter for pregnancy test, result positive     Discharge Instructions      Your urine pregnancy test today was positive.  I have sent to your pharmacy's vitamin B6 10 mg to take for your nausea up to 3 times a day as needed.  You may also purchase vitamin B6 over-the-counter.  I recommend resuming your prenatal vitamin daily  and  follow-up with your OB/GYN.  Avoid any other over-the-counter medications or prescription drugs at this time.  For your sore throat I would recommend some over-the-counter throat spray and lozenges.  Remember to stay adequately hydrated.     ED Prescriptions     Medication Sig Dispense Auth. Provider   pyridOXINE 10 MG TABS Take 25 mg by mouth daily. 20 tablet Rodena Goldmann A, DO      PDMP not reviewed this encounter.   Rodena Goldmann A, DO 11/25/22 1120

## 2022-11-25 NOTE — ED Triage Notes (Addendum)
Pt reports Nausea and is unable to eat. Pt also reports a dry cough and horse throat. Sx's for one week. Pt has ABD  cramping. Pt reports she has not had a Period in 6 months.

## 2022-11-25 NOTE — Discharge Instructions (Addendum)
Your urine pregnancy test today was positive.  I have sent to your pharmacy's vitamin B6 10 mg to take for your nausea up to 3 times a day as needed.  You may also purchase vitamin B6 over-the-counter.  I recommend resuming your prenatal vitamin daily and follow-up with your OB/GYN.  Avoid any other over-the-counter medications or prescription drugs at this time.  For your sore throat I would recommend some over-the-counter throat spray and lozenges.  Remember to stay adequately hydrated.

## 2022-11-30 ENCOUNTER — Other Ambulatory Visit (HOSPITAL_COMMUNITY)
Admission: RE | Admit: 2022-11-30 | Discharge: 2022-11-30 | Disposition: A | Discharge status: Home / Self Care | Payer: Medicaid Other | Source: Ambulatory Visit | Attending: Family Medicine | Admitting: Family Medicine

## 2022-11-30 ENCOUNTER — Encounter: Payer: Self-pay | Admitting: Family Medicine

## 2022-11-30 ENCOUNTER — Ambulatory Visit (INDEPENDENT_AMBULATORY_CARE_PROVIDER_SITE_OTHER): Payer: Medicaid Other | Admitting: Family Medicine

## 2022-11-30 VITALS — BP 102/58 | HR 78 | Wt 200.0 lb

## 2022-11-30 DIAGNOSIS — Z3490 Encounter for supervision of normal pregnancy, unspecified, unspecified trimester: Secondary | ICD-10-CM | POA: Insufficient documentation

## 2022-11-30 DIAGNOSIS — Z8742 Personal history of other diseases of the female genital tract: Secondary | ICD-10-CM | POA: Insufficient documentation

## 2022-11-30 DIAGNOSIS — Z3491 Encounter for supervision of normal pregnancy, unspecified, first trimester: Secondary | ICD-10-CM

## 2022-11-30 DIAGNOSIS — Z640 Problems related to unwanted pregnancy: Secondary | ICD-10-CM | POA: Insufficient documentation

## 2022-11-30 NOTE — Progress Notes (Signed)
    SUBJECTIVE:   CHIEF COMPLAINT / HPI:   Positive Pregnancy Test  Patient had positive pregnancy test at urgent care.  She went in for abdominal pain and pharyngitis at that time.  This was an unplanned pregnancy.  She is sexually active with her partner does not use contraception and was not using barrier protection at that time.  She does not want this pregnancy as she recently had a child and delivered 6 months ago.  This is causing patient much distress.  She was told by urgent care that she needed to have an initial OB visit to be scheduled before she could consider or schedule appointment for other options such as abortion.  She would like an abortion at this time. Has not had any symptoms such as nausea, abdominal pain, cramps, vaginal bleeding, vaginal discharge.  Patient is amenable to birth control is not interested in implant or IUD at this time.  Abnormal Pap smear  Patient has history of abnormal Pap smear with L SIL in 2023.  Was recommended to get another Pap smear in February 2024 but did not.  No vaginal discharge irritation or pain.  PERTINENT  PMH / PSH: H/o abnormal pap smear   OBJECTIVE:   BP (!) 102/58   Pulse 78   Wt 200 lb (90.7 kg)   LMP 10/14/2022 (Approximate)   BMI 34.33 kg/m   General: well appearing, in no acute distress CV: RRR, radial pulses equal and palpable, no BLE edema  Resp: Normal work of breathing on room air, CTAB Abd: Soft, non tender, non distended  Neuro: Alert & Oriented x 4  GU: Chaperoned by RN and Dr. Gwendlyn Deutscher, No vaginal discharge, no lesions visible on cervix   ASSESSMENT/PLAN:   Unwanted pregnancy with plans for termination Patient is at most 6 weeks 5 days at this time.  She is unsure of her exact LMP but knows that it was at the beginning of February.  She does not have any abdominal pain or vaginal bleeding.  Unlikely an ectopic pregnancy.  She would like to terminate the pregnancy. - Given resources for women's choice and told  patient to follow-up if she had difficulty making the appointment.  Counseled patient on options including continuing with pregnancy, resources for adoption, termination and counseled patient that in [place] would need to terminate by 12 weeks and 6 days.  Also counseled patient that beyond 8 to 10 weeks there is a higher risk of termination with pills thus implored patient to call center as soon as she makes her decision. - Recommended that patient make appointment to discuss contraception after termination.  Patient declined making appointment at this time will make appointment after.  History of abnormal cervical Pap smear Completed Pap smear today. ASCUS + high risk HPV 04/2021. Repeat LSIL with some few cells suggestive of higher grade lesion 04/2022 - Follow-up with results     Lowry Ram, MD [place]

## 2022-11-30 NOTE — Assessment & Plan Note (Addendum)
Completed Pap smear today. ASCUS + high risk HPV 04/2021. Repeat LSIL with some few cells suggestive of higher grade lesion 04/2022 - Follow-up with results

## 2022-11-30 NOTE — Patient Instructions (Addendum)
It was wonderful to see you today.  Please bring ALL of your medications with you to every visit.   Today we talked about:  Positive pregnancy test - I put some more resources down below. Please let me know on mychart if you have any difficulty with scheduling an appointment. Please make an appointment in about a month to discuss contraception/birth control. Make sure you give them a call soon to avoid a high risk pill abortion and make sure that you can still use the pill.   I will follow up with your pap results.   Please follow up in 1 month   Thank you for choosing [facility].   Please call [phone] with any questions about today's appointment.  Please be sure to schedule follow up at the front desk before you leave today.   Lowry Ram, MD  Family Medicine   Abortion resources:  [facility] [address] [place], [place] [address] [phone]  [facility] up to 12 weeks 6 days  Our truly inclusive fee includes for [facility] and surrounding [place] residents: Lab & ultrasound Abortion care [place] sedation options  Support & accurate information Optional Birth Control prescription for 1 year, per doctor approval Follow up care  [facility] Patients State law requires that you receive and hear a state-written 72 Hour Consent script.  Our Registered Nurse will speak with you privately, by phone, or in person, and obtain  your [facility] State required consent 72 hours before your abortion care with [place]. We can  offer this when you call for your abortion care appointment. We're here for you and for  [place], [place], and surrounding areas and states.  Ask [place] About Financial Help. Our funding assistance experts are available to talk with you  about options for funding assistance and also challenges of transportation, child care, hotels, and more. Private Abortion Care Appointments. If you prefer or require exclusive privacy to  ensure that you are not seen or recognized by other patients, friends, or families, we offer fully private abortion services. We can address and provide special security issues as needed and promise you full and complete confidentiality.* There is a fee to arrange for only you, your loved ones, and the doctor and medical staff to be in the clinic at the time of your specially scheduled appointment. Please call and we can have a conversation about helping you through.

## 2022-11-30 NOTE — Assessment & Plan Note (Signed)
Patient is at most 6 weeks 5 days at this time.  She is unsure of her exact LMP but knows that it was at the beginning of February.  She does not have any abdominal pain or vaginal bleeding.  Unlikely an ectopic pregnancy.  She would like to terminate the pregnancy. - Given resources for women's choice and told patient to follow-up if she had difficulty making the appointment.  Counseled patient on options including continuing with pregnancy, resources for adoption, termination and counseled patient that in [place] would need to terminate by 12 weeks and 6 days.  Also counseled patient that beyond 8 to 10 weeks there is a higher risk of termination with pills thus implored patient to call center as soon as she makes her decision. - Recommended that patient make appointment to discuss contraception after termination.  Patient declined making appointment at this time will make appointment after.

## 2022-12-03 LAB — CYTOLOGY - PAP
Comment: NEGATIVE
High risk HPV: POSITIVE — AB

## 2022-12-06 ENCOUNTER — Telehealth: Payer: Self-pay | Admitting: Family Medicine

## 2022-12-06 NOTE — Telephone Encounter (Signed)
Called patient to discuss pap results and need for colposcopy. She was in class and said she could not talk at this time.   Would recommend colposcopy based on results of test. Patient was pregnant at time of last visit and planning on termination. IF she has terminated, would schedule colposcopy, if not would schedule after delivery.

## 2022-12-06 NOTE — Telephone Encounter (Signed)
Patient returns call to nurse line.   Pap results discussed with patient and need for colposcopy.   Patient advised we would schedule this after delivery or after abortion. She reports she had an apt this morning for termination. However, she reports she forgot all about it due to her school schedule. Patient reports she will call and schedule colposcopy once termination is complete.  All questions answered.

## 2022-12-13 HISTORY — PX: INDUCED ABORTION: SHX677

## 2023-01-21 ENCOUNTER — Ambulatory Visit: Payer: Medicaid Other | Admitting: Student

## 2023-01-27 ENCOUNTER — Ambulatory Visit: Payer: Medicaid Other

## 2023-02-17 ENCOUNTER — Other Ambulatory Visit: Payer: Self-pay | Admitting: Family Medicine

## 2023-02-17 ENCOUNTER — Ambulatory Visit (INDEPENDENT_AMBULATORY_CARE_PROVIDER_SITE_OTHER): Payer: Medicaid Other | Admitting: Student

## 2023-02-17 VITALS — BP 110/74 | HR 82 | Ht 64.0 in | Wt 196.4 lb

## 2023-02-17 DIAGNOSIS — R87612 Low grade squamous intraepithelial lesion on cytologic smear of cervix (LGSIL): Secondary | ICD-10-CM

## 2023-02-17 DIAGNOSIS — Z8742 Personal history of other diseases of the female genital tract: Secondary | ICD-10-CM

## 2023-02-17 DIAGNOSIS — N870 Mild cervical dysplasia: Secondary | ICD-10-CM | POA: Diagnosis not present

## 2023-02-17 DIAGNOSIS — R87619 Unspecified abnormal cytological findings in specimens from cervix uteri: Secondary | ICD-10-CM | POA: Diagnosis not present

## 2023-02-17 LAB — POCT URINE PREGNANCY: Preg Test, Ur: NEGATIVE

## 2023-02-17 NOTE — Assessment & Plan Note (Signed)
Colposcopy completed today.  Specimens obtained include ECC and two cervical biopsies.  Pathology results will return to Dr. Lum Babe.

## 2023-02-17 NOTE — Patient Instructions (Signed)
Colposcopy A colposcopy is a procedure to find out whether there are abnormal cells on or in a woman's cervix or vagina. The cervix is the part of the womb that sits in the vagina.  Abnormalities tend to occur at the opening of the cervix to the birth canal, where it enters the womb. A colposcopy allows a doctor or trained nurse to find these abnormalities.  In some women, the presence of 'abnormal cells' carries the risk of developing cervical cancer. A colposcopy is used to determine whether treatment will be needed to deal with these cells.  After your colposcopy You should be able to continue with your daily activities after your appointment, including driving. For a few days after your colposcopy, you may have a brownish vaginal discharge, or light bleeding if you had a biopsy. This is normal and will usually stop after 3 to 5 days.  We will notify you with results in the next few days.

## 2023-02-17 NOTE — Progress Notes (Signed)
    SUBJECTIVE:   CHIEF COMPLAINT / HPI:   Abnormal Pap smear Patient presents for a colposcopy after multiple abnormal Pap smears. 05/08/2021-high risk HPV positive and ASC-US 04/22/2022-LSIL 11/30/2022-high risk HPV positive and LSIL  Of note, she was seen on 11/30/2022 due to positive home pregnancy test which was confirmed in office.  Patient elected to terminate pregnancy which was done at Abortion clinic in [place] via [facility] about a month ago. She had a light and short period about 2 weeks ago, U preg negative in office today.    OBJECTIVE:   BP 110/74   Pulse 82   Ht 5\' 4"  (1.626 m)   Wt 196 lb 6.4 oz (89.1 kg)   LMP 10/14/2022 (Approximate)   SpO2 98%   BMI 33.71 kg/m    General: NAD, pleasant, able to participate in exam Cardiac: Well-perfused Respiratory: Breathing comfortably on room air GU: Chaperoned by RN.  Normal external female genitalia, pink moist vaginal mucosa, acetowhite lesions at 1:00 and 11:00, otherwise normal-appearing cervix Neuro: alert, no obvious focal deficits Psych: Normal affect and mood  Procedure:  Pre-Procedure  Consent signed and reviewed with patient: Side effects, risks and complications discussed (including bleeding, infection, vaginal discharge, pain or discomfort, non-diagnostic colposcopy result) Procedure Colposcopy of: Cervix  The vulva, vagina and perianal regions were examined with no gross abnormalities   Speculum placed, cervix and squamocolumnar junction Fully visualized  Acetic acid and Lugol's solution applied: visualized acetowhite lesions at 11:00 and 1:00 Specimens obtained: ECC with or without biopsies at 11:00 and 1:00  Hemostatic measures used:Monsel's solution  There were no complications Patient tolerated procedure well    ASSESSMENT/PLAN:   History of abnormal cervical Pap smear Colposcopy completed today.  Specimens obtained include ECC and two cervical biopsies.  Pathology results will return to Dr.  Lum Babe.    Pregnancy was terminated about a month ago with D&C at abortion clinic in [place].  Patient declines any birth control today.  She is aware that she can make an appointment at any time to return to discuss birth control in the future if desired.  Today I advised using condoms for contraception.  Dr. Erick Alley, DO [facility]

## 2023-02-22 ENCOUNTER — Telehealth: Payer: Self-pay | Admitting: Family Medicine

## 2023-02-22 ENCOUNTER — Encounter: Payer: Self-pay | Admitting: Family Medicine

## 2023-02-22 NOTE — Telephone Encounter (Signed)
I called to discussed cervical biopsy report. She requested that I send her a MyChart message as she is currently busy.  Message sent.

## 2023-03-31 NOTE — Progress Notes (Cosign Needed Addendum)
    SUBJECTIVE:   CHIEF COMPLAINT / HPI:   Vaginal discharge/dysuria Reports increased malodorous vaginal discharge x 1 week. Associated with some burning with urination. Sexually active within the last week, reports it may have started after that time. Declines Sti testing and contraception discussion today. Recently on menstrual cycle.   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  Psych: Appropriate mood and affect  ASSESSMENT/PLAN:   Vaginal discharge Wet prep positive for yeast cells. Given associated dysuria, obtained POCT UA. UA with only trace leukocytes, UTI unlikely. Patient declined any STI testing. -Fluconazole x1 to treat yeast infection  Encounter for counseling regarding contraception Recent unwanted pregnancy that was electively terminated in 11/2022. Offered patient contraception options today but she declined discussion.   Dr. Elberta Fortis, DO [facility]

## 2023-04-01 ENCOUNTER — Ambulatory Visit (INDEPENDENT_AMBULATORY_CARE_PROVIDER_SITE_OTHER): Payer: Medicaid Other | Admitting: Family Medicine

## 2023-04-01 VITALS — BP 113/80 | HR 88 | Ht 64.0 in | Wt 204.2 lb

## 2023-04-01 DIAGNOSIS — B3731 Acute candidiasis of vulva and vagina: Secondary | ICD-10-CM | POA: Diagnosis not present

## 2023-04-01 DIAGNOSIS — Z3009 Encounter for other general counseling and advice on contraception: Secondary | ICD-10-CM | POA: Diagnosis not present

## 2023-04-01 DIAGNOSIS — R300 Dysuria: Secondary | ICD-10-CM

## 2023-04-01 DIAGNOSIS — N898 Other specified noninflammatory disorders of vagina: Secondary | ICD-10-CM | POA: Diagnosis not present

## 2023-04-01 LAB — POCT WET PREP (WET MOUNT)
Clue Cells Wet Prep Whiff POC: NEGATIVE
Trichomonas Wet Prep HPF POC: ABSENT

## 2023-04-01 LAB — POCT URINALYSIS DIP (MANUAL ENTRY)
Bilirubin, UA: NEGATIVE
Glucose, UA: NEGATIVE mg/dL
Ketones, POC UA: NEGATIVE mg/dL
Nitrite, UA: NEGATIVE
Protein Ur, POC: NEGATIVE mg/dL
Spec Grav, UA: >=1.03 — AB (ref 1.010–1.025)
Urobilinogen, UA: 0.2 E.U./dL
pH, UA: 5.5 (ref 5.0–8.0)

## 2023-04-01 LAB — POCT UA - MICROSCOPIC ONLY

## 2023-04-01 MED ORDER — FLUCONAZOLE 150 MG PO TABS: 150.0000 mg | ORAL_TABLET | Freq: Once | ORAL | 0 refills | Status: AC

## 2023-04-01 NOTE — Patient Instructions (Addendum)
It was wonderful to see you today! Thank you for choosing [facility].   Please bring ALL of your medications with you to every visit.   Today we talked about:  You have a yeast infection. I sent in the Diflucan you take one time for treatment. Your urine was negative on our point of care testing but I will get the results because looking for bacteria as well.  Please follow up as needed for persistent symptoms  We are checking some labs today. If they are abnormal, I will call you. If they are normal, I will send you a MyChart message (if it is active) or a letter in the mail. If you do not hear about your labs in the next 2 weeks, please call the office.  Call the clinic at [phone] if your symptoms worsen or you have any concerns.  Please be sure to schedule follow up at the front desk before you leave today.   Elberta Fortis, DO Family Medicine

## 2023-04-01 NOTE — Assessment & Plan Note (Addendum)
Wet prep positive for yeast cells. Given associated dysuria, obtained POCT UA. UA with only trace leukocytes, UTI unlikely. Patient declined any STI testing. -Fluconazole x1 to treat yeast infection

## 2023-04-01 NOTE — Assessment & Plan Note (Signed)
Recent unwanted pregnancy that was electively terminated in 11/2022. Offered patient contraception options today but she declined discussion.

## 2023-04-02 ENCOUNTER — Ambulatory Visit (HOSPITAL_COMMUNITY)
Admission: EM | Admit: 2023-04-02 | Discharge: 2023-04-02 | Disposition: A | Discharge status: Home / Self Care | Payer: Medicaid Other | Attending: Emergency Medicine | Admitting: Emergency Medicine

## 2023-04-02 ENCOUNTER — Encounter (HOSPITAL_COMMUNITY): Payer: Self-pay

## 2023-04-02 DIAGNOSIS — R300 Dysuria: Secondary | ICD-10-CM

## 2023-04-02 DIAGNOSIS — R1030 Lower abdominal pain, unspecified: Secondary | ICD-10-CM | POA: Diagnosis not present

## 2023-04-02 LAB — POCT URINALYSIS DIP (MANUAL ENTRY)
Bilirubin, UA: NEGATIVE
Glucose, UA: NEGATIVE mg/dL
Ketones, POC UA: NEGATIVE mg/dL
Leukocytes, UA: NEGATIVE
Nitrite, UA: NEGATIVE
Protein Ur, POC: NEGATIVE mg/dL
Spec Grav, UA: >=1.03 — AB (ref 1.010–1.025)
Urobilinogen, UA: 1 E.U./dL
pH, UA: 6 (ref 5.0–8.0)

## 2023-04-02 LAB — POCT URINE PREGNANCY: Preg Test, Ur: NEGATIVE

## 2023-04-02 NOTE — Discharge Instructions (Addendum)
Your urine did not show obvious signs of infection, we are sending this off for culture and we will contact you if any discharge on antibiotics.  In the meantime please ensure you are drinking at least 64 ounces of water daily.  You can take over-the-counter Azo or Pyridium to help if you have any burning or dysuria.  Please avoid overly sugary drinks like sodas or juices.    Return to clinic or follow-up with your primary care if you have any new or concerning symptoms.

## 2023-04-02 NOTE — ED Triage Notes (Signed)
Abdominal pain onset early this morning. Pain in the suprapubic area and radiating upward. Patient states this feels worse than cramps. Patient had some nausea but no diarrhea, emesis, or constipation. No known sick exposure and no one with similar symptoms.

## 2023-04-02 NOTE — ED Provider Notes (Signed)
[facility]    CSN: [id number] Arrival date & time: 04/02/23  1003      History   Chief Complaint Chief Complaint  Patient presents with   Abdominal Pain    HPI Rhonda Dixon is a 22 y.o. female.   Patient presents to clinic with complaints of the suprapubic pain that started this morning.  Reports pain is intermittent and will radiate up to the rest of her abdomen.  She was having dysuria yesterday.  Her last menstrual cycle started on July 11.  Reports nausea but no emesis.  Denies any fevers or flank pain.  She was seen at her family medicine doctor yesterday and diagnosed with a yeast infection, started on fluconazole.  Her urinalysis showed red blood cells and leukocytes yesterday.   Patient declines STI screening, reports she has been with her fianc for 3 years and has low suspicion of STIs.   Denies constipation, diarrhea or recent sick contacts.   Recent unwanted pregnancy that was electively terminated in 11/2022.     The history is provided by the patient and medical records.  Abdominal Pain Associated symptoms: dysuria and nausea   Associated symptoms: no constipation, no diarrhea, no fever and no vomiting     Past Medical History:  Diagnosis Date   Medical history non-contributory     Patient Active Problem List   Diagnosis Date Noted   Cold sore 10/11/2022   History of abnormal cervical Pap smear 04/28/2022   Encounter for counseling regarding contraception 07/23/2021   Generalized anxiety disorder 10/23/2018   Vaginal discharge 07/12/2017   ALLERGIC RHINITIS, SEASONAL 04/01/2009    Past Surgical History:  Procedure Laterality Date   INDUCED ABORTION  12/2022   NO PAST SURGERIES      OB History     Gravida  2   Para  1   Term  1   Preterm      AB  1   Living  1      SAB      IAB      Ectopic      Multiple  0   Live Births  1            Home Medications    Prior to Admission medications   Medication  Sig Start Date End Date Taking? Authorizing Provider  fluconazole (DIFLUCAN) 150 MG tablet Take 150 mg by mouth once. 04/01/23  Yes [provider]  pyridOXINE 10 MG TABS Take 25 mg by mouth daily. 11/25/22   Gillermo Murdoch A, DO  escitalopram (LEXAPRO) 10 MG tablet Take 0.5 tablets (5 mg total) by mouth daily. 10/23/18 01/17/20  Lennox Solders, MD  FLUoxetine (PROZAC) 20 MG tablet Take 1 tablet (20 mg total) by mouth daily. 12/18/19 01/17/20  Mirian Mo, MD  ipratropium (ATROVENT) 0.06 % nasal spray Place 2 sprays into both nostrils 4 (four) times daily. 07/26/18 01/17/20  Belinda Fisher, PA-C  omeprazole (PRILOSEC) 20 MG capsule Take 1 capsule (20 mg total) by mouth daily. 08/29/18 06/26/19  Tillman Sers, DO    Family History Family History  Problem Relation Age of Onset   Asthma Mother    Lupus Mother    Autoimmune disease Mother     Social History Social History   Tobacco Use   Smoking status: Never   Smokeless tobacco: Never  Vaping Use   Vaping status: Former   Substances: Nicotine, Flavoring  Substance Use Topics   Alcohol use:  Not Currently    Comment: occ   Drug use: No     Allergies   Patient has no known allergies.   Review of Systems Review of Systems  Constitutional:  Negative for fever.  Gastrointestinal:  Positive for abdominal pain and nausea. Negative for constipation, diarrhea and vomiting.  Genitourinary:  Positive for dysuria. Negative for flank pain and menstrual problem.     Physical Exam Triage Vital Signs ED Triage Vitals  Encounter Vitals Group     BP 04/02/23 1016 117/77     Systolic BP Percentile --      Diastolic BP Percentile --      Pulse Rate 04/02/23 1016 86     Resp 04/02/23 1016 18     Temp 04/02/23 1016 98 F (36.7 C)     Temp Source 04/02/23 1016 Oral     SpO2 04/02/23 1016 98 %     Weight 04/02/23 1016 204 lb (92.5 kg)     Height 04/02/23 1016 5\' 4"  (1.626 m)     Head Circumference --      Peak Flow --      Pain  Score 04/02/23 1013 8     Pain Loc --      Pain Education --      Exclude from Growth Chart --    No data found.  Updated Vital Signs BP 117/77 (BP Location: Left Arm)   Pulse 86   Temp 98 F (36.7 C) (Oral)   Resp 18   Ht 5\' 4"  (1.626 m)   Wt 204 lb (92.5 kg)   LMP 03/24/2023 (Exact Date)   SpO2 98%   Breastfeeding No   BMI 35.02 kg/m   Visual Acuity Right Eye Distance:   Left Eye Distance:   Bilateral Distance:    Right Eye Near:   Left Eye Near:    Bilateral Near:     Physical Exam Vitals and nursing note reviewed.  Constitutional:      Appearance: She is well-developed.  HENT:     Head: Normocephalic and atraumatic.     Mouth/Throat:     Mouth: Mucous membranes are moist.  Cardiovascular:     Rate and Rhythm: Normal rate.  Pulmonary:     Effort: Pulmonary effort is normal. No respiratory distress.  Abdominal:     General: Abdomen is flat. Bowel sounds are normal.     Palpations: Abdomen is soft.     Tenderness: There is no abdominal tenderness. There is no right CVA tenderness, left CVA tenderness, guarding or rebound.     Hernia: No hernia is present.  Skin:    General: Skin is warm and dry.  Neurological:     General: No focal deficit present.     Mental Status: She is alert.  Psychiatric:        Mood and Affect: Mood normal.      UC Treatments / Results  Labs (all labs ordered are listed, but only abnormal results are displayed) Labs Reviewed  POCT URINALYSIS DIP (MANUAL ENTRY) - Abnormal; Notable for the following components:      Result Value   Spec Grav, UA >=1.030 (*)    Blood, UA trace-intact (*)    All other components within normal limits  URINE CULTURE  POCT URINE PREGNANCY    EKG   Radiology No results found.  Procedures Procedures (including critical care time)  Medications Ordered in UC Medications - No data to display  Initial Impression / Assessment  and Plan / UC Course  I have reviewed the triage vital signs and  the nursing notes.  Pertinent labs & imaging results that were available during my care of the patient were reviewed by me and considered in my medical decision making (see chart for details).  Vitals and triage reviewed, patient is hemodynamically stable.  Abdomen is soft and nontender with active bowel sounds, patient having intermittent suprapubic tenderness.  Had dysuria yesterday with a urinalysis that showed red blood cells and leukocytes.  Urinalysis today showed trace red blood cells, will send for culture due to intermittent suprapubic pain.  Urine pregnancy was negative.  Discussed symptomatic management for dysuria, staff to contact if culture shows need for antibiotics.  Plan of care, follow-up care and return precautions given, no questions at this time.  Work note provided.    Final Clinical Impressions(s) / UC Diagnoses   Final diagnoses:  Lower abdominal pain  Dysuria     Discharge Instructions      Your urine did not show obvious signs of infection, we are sending this off for culture and we will contact you if any discharge on antibiotics.  In the meantime please ensure you are drinking at least 64 ounces of water daily.  You can take over-the-counter Azo or Pyridium to help if you have any burning or dysuria.  Please avoid overly sugary drinks like sodas or juices.    Return to clinic or follow-up with your primary care if you have any new or concerning symptoms.     ED Prescriptions   None    PDMP not reviewed this encounter.   Hanadi Stanly, [place], [place] 04/02/23 1043

## 2023-04-03 LAB — URINE CULTURE: Culture: 20000 — AB

## 2023-04-04 ENCOUNTER — Telehealth (HOSPITAL_COMMUNITY): Payer: Self-pay | Admitting: Emergency Medicine

## 2023-04-04 MED ORDER — CEPHALEXIN 500 MG PO CAPS: 500.0000 mg | ORAL_CAPSULE | Freq: Two times a day (BID) | ORAL | 0 refills | Status: AC

## 2023-04-19 IMAGING — US US OB COMP LESS 14 WK
1 series · 15 of 28 positions shown · non-contrast
Comparison: None.

CLINICAL DATA: Vaginal bleeding and cramps

EXAM:
OBSTETRIC <14 WK US AND TRANSVAGINAL OB US
TECHNIQUE: Both transabdominal and transvaginal ultrasound examinations were
performed for complete evaluation of the gestation as well as the
maternal uterus, adnexal regions, and pelvic cul-de-sac.
Transvaginal technique was performed to assess early pregnancy.

[Series 1: us ob comp less 14 wk · 33 acquisitions, 15 frames shown]
[im 1/33]
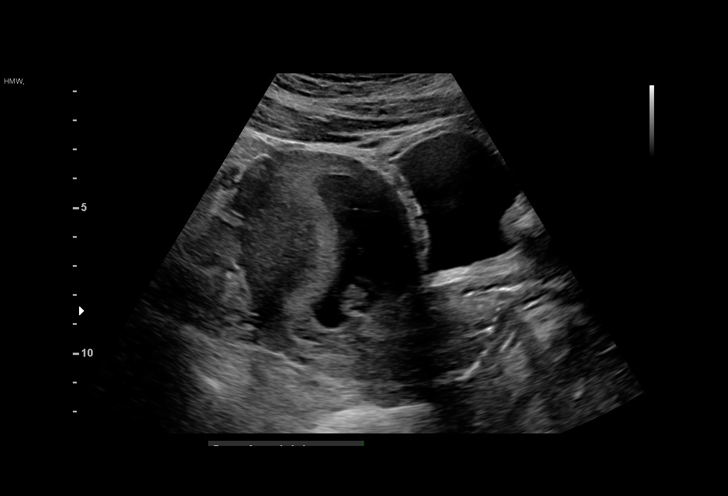
[im 3/33]
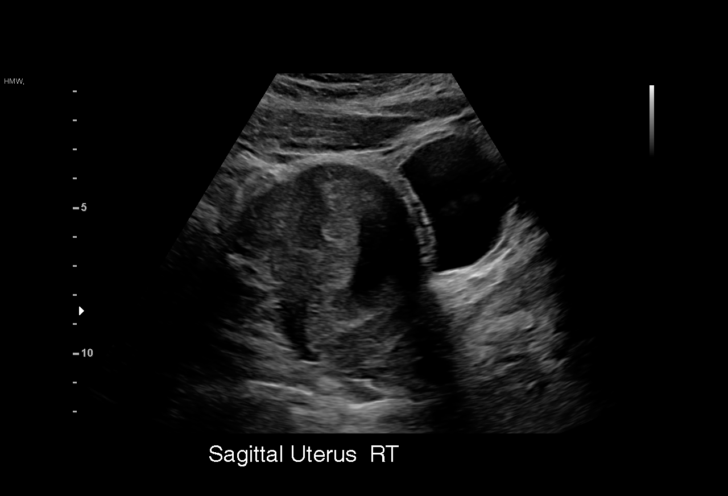
[im 5/33]
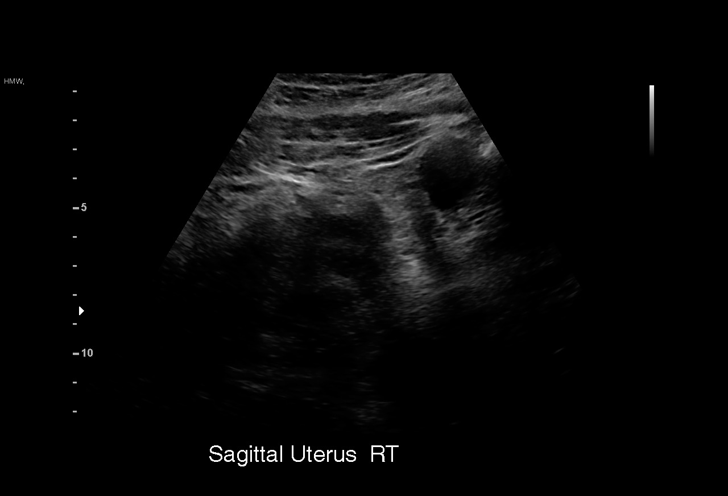
[im 8/33]
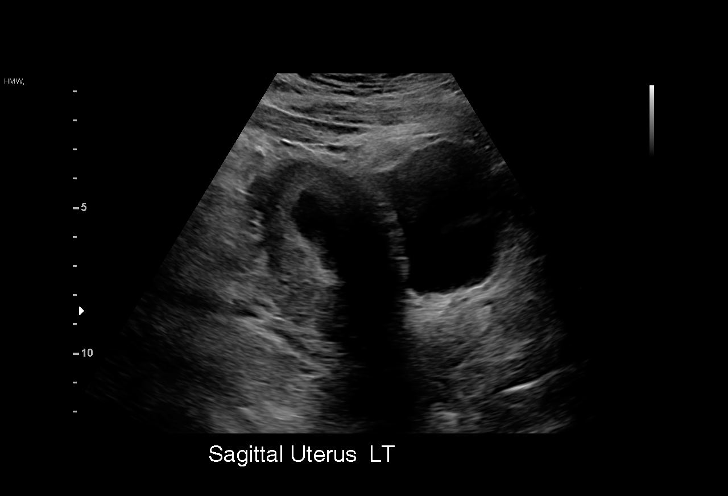
[im 10/33]
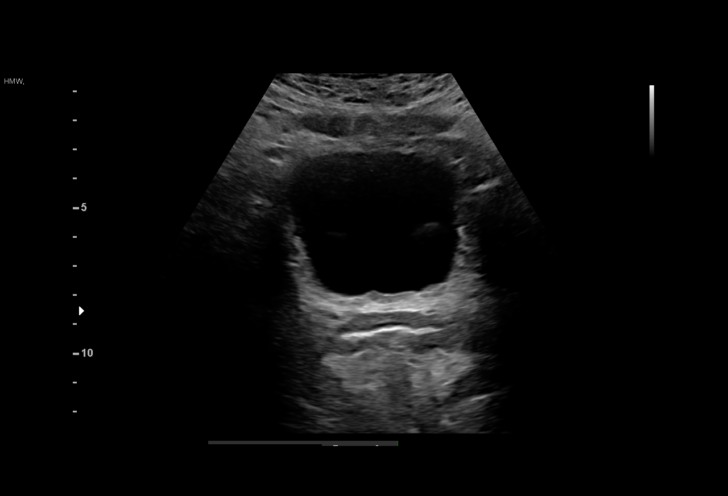
[im 12/33]
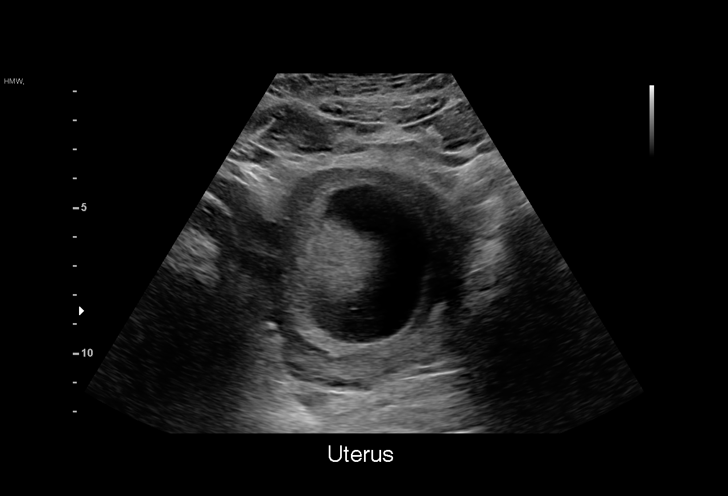
[im 15/33]
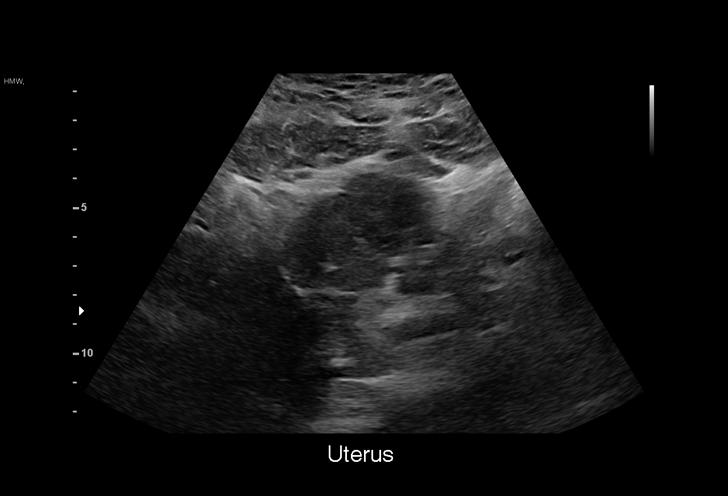
[im 17/33]
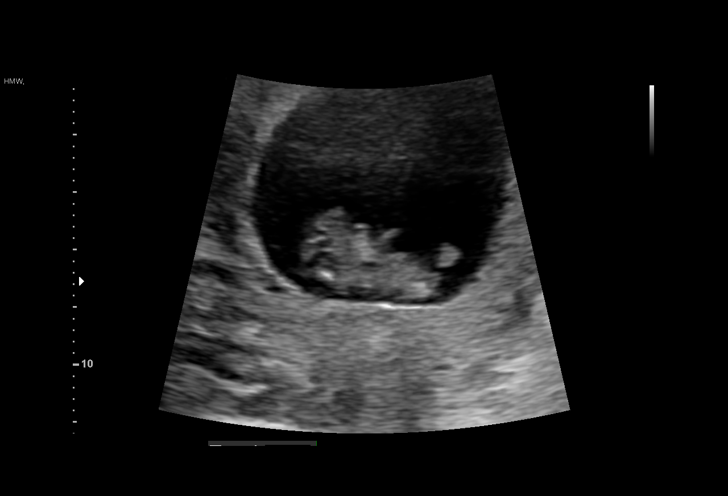
[im 18/33]
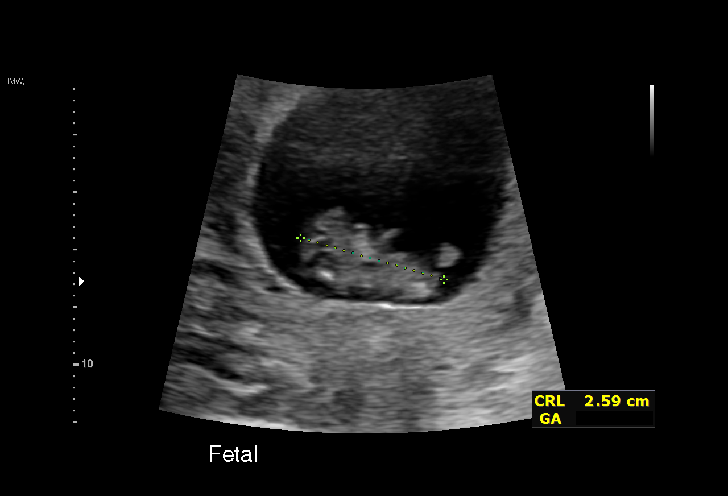
[im 21/33]
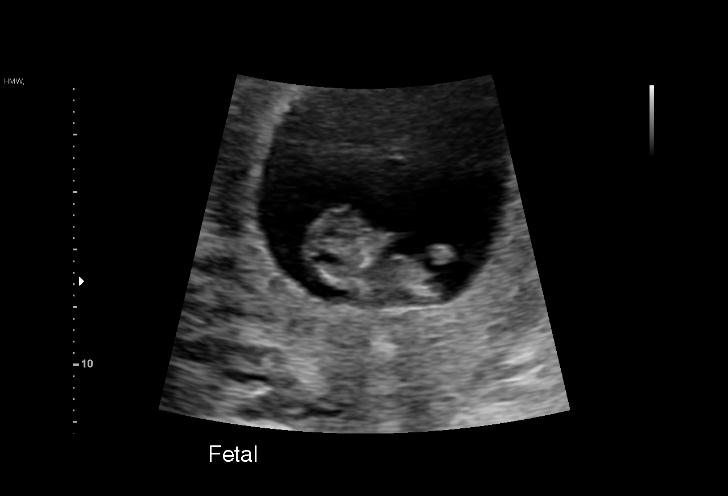
[im 23/33]
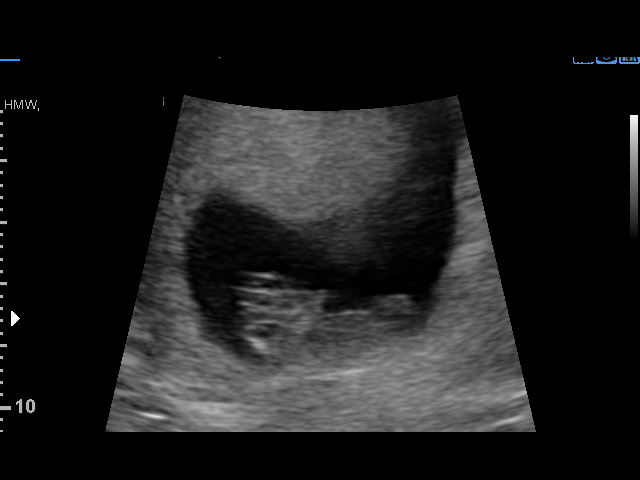
[im 25/33]
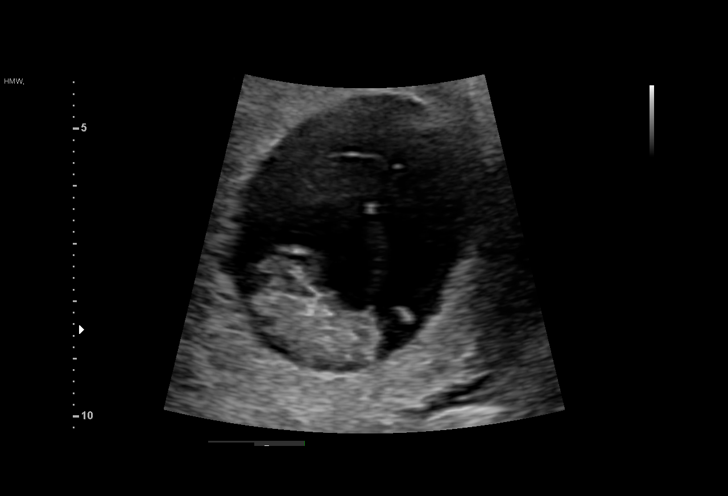
[im 28/33]
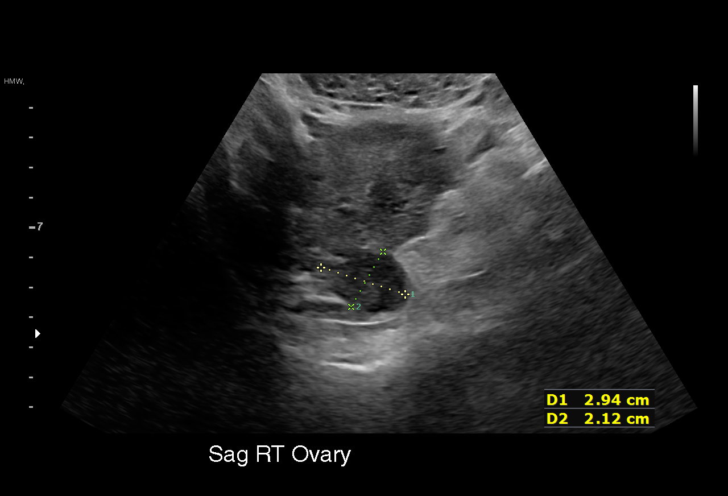
[im 30/33]
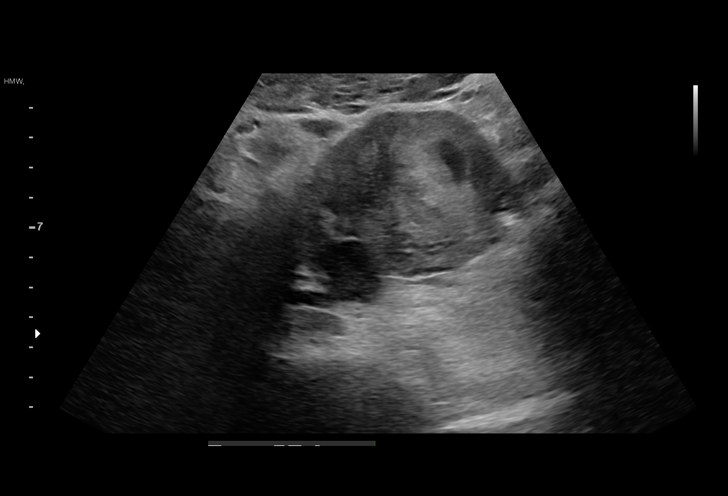
[im 33/33]
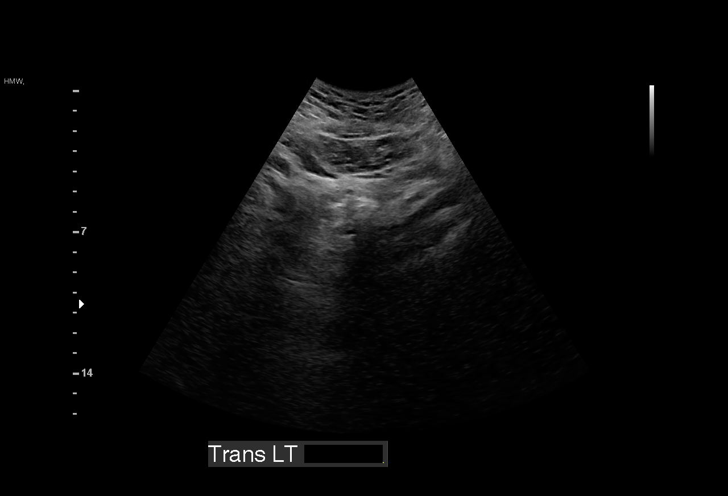

[15 of 28 positions shown; findings below may reference images not displayed]

FINDINGS: Intrauterine gestational sac: Single

Yolk sac:  Visualized.

Embryo:  Visualized.

Cardiac Activity: Visualized.

Heart Rate: 147  bpm

CRL:  26.3 mm   9 w   3 d                  US EDC: 05/14/2022

Subchorionic hemorrhage:  None visualized.

Maternal uterus/adnexae: Normal appearing right ovary. Left ovary is
not visualized. No free fluid seen in the pelvis
IMPRESSION: Single viable intrauterine pregnancy with estimated gestational age
of 9 weeks 3 days.

## 2023-06-06 ENCOUNTER — Ambulatory Visit (INDEPENDENT_AMBULATORY_CARE_PROVIDER_SITE_OTHER): Payer: Medicaid Other | Admitting: Family Medicine

## 2023-06-06 VITALS — BP 108/87 | HR 80 | Ht 64.0 in | Wt 209.0 lb

## 2023-06-06 DIAGNOSIS — L299 Pruritus, unspecified: Secondary | ICD-10-CM | POA: Insufficient documentation

## 2023-06-06 DIAGNOSIS — M26609 Unspecified temporomandibular joint disorder, unspecified side: Secondary | ICD-10-CM | POA: Insufficient documentation

## 2023-06-06 DIAGNOSIS — G8929 Other chronic pain: Secondary | ICD-10-CM

## 2023-06-06 DIAGNOSIS — R519 Headache, unspecified: Secondary | ICD-10-CM | POA: Diagnosis not present

## 2023-06-06 HISTORY — DX: Pruritus, unspecified: L29.9

## 2023-06-06 MED ORDER — HYDROCORTISONE-ACETIC ACID 1-2 % OT SOLN: 3.0000 [drp] | Freq: Three times a day (TID) | OTIC | 0 refills | Status: DC

## 2023-06-06 NOTE — Patient Instructions (Addendum)
It was great to see you today! Thank you for choosing [facility] for your primary care.  Today we addressed:  Ear itching/pain There are a few things that could be causing this.  It could be because of your jaw pain, allergies, or a fungal infection.  Using Q-tips is not helping the situation and can make things worse.  We will have you do 3 ear drops in both ears 3 times a day for the next 5 days.  Please STOP if the pain/itching/irritation gets worse and call to come back to the clinic sooner.  Jaw pain You can use over the counter ibuprofen along with your acetaminophen for the pain.  I am also attaching some jaw range of motion exercises to do carefully at home.  Headache This headache could be because of stress or the jaw pain or something else.  Acetaminophen is not a bad treatment.  Please keep a headache diary for 1 month (use the below worksheet or just a piece of paper/notebook).  Please write down when you get the headache and what happens around that time, what you do for it.  Come back in 1 month to discuss more.  You should return to our clinic in 1 month to talk more about your headaches.  Thank you for coming to see [place] at [facility] and for the opportunity to care for you! Rhonda Weatherspoon, MD 06/06/2023, 11:22 AM   Week of ______ Goals Sunday Monday Tuesday Wednesday Thursday Friday Saturday   1          2          3            Week of ______ Goals Sunday Monday Tuesday Wednesday Thursday Friday Saturday   1          2          3            Week of ______ Goals Sunday Monday Tuesday Wednesday Thursday Friday Saturday   1          2          3            Week of ______ Goals Sunday Monday Tuesday Wednesday Thursday Friday Saturday   1          2          3            Week of ______ Goals Sunday Monday Tuesday Wednesday Thursday Friday Saturday   1          2          3
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
## 2023-06-06 NOTE — Assessment & Plan Note (Addendum)
Differential includes tension type headache versus TMJ-related pain versus cervicogenic headache.  Also considering intracranial hypertension etiology such as malignancy, though less likely given patient's young age, lack of systemic symptoms, associate neurological symptoms, high intensity, and with benign neurological exam.  Does have red flag symptom of worsening with horizontal position change, however lack of nausea and vomiting are reassuring. -Headache diary for 1 month and return for follow-up at that point, consider imaging if diary review is concerning at that time -Continue acetaminophen, add ibuprofen for possible TMJ

## 2023-06-06 NOTE — Assessment & Plan Note (Addendum)
Differential includes fungal infection, irritation with Q-tips, pain and discomfort due to TMJ, or chronic irritation due to allergies.  No concern for psoriasis or eczema given minimal skin findings.  Did consider malignancy, but patient is young without history of smoking hands no history of skin cancers in family and SCC/BCC/melanoma is of low concern. -Acetic acid-hydrocortisone otic solution 3 gtt TID x5 days, discontinue if worsening and return -Counseled patient on importance of having some earwax in ears, strongly recommended stopping Q-tip use -Return precautions if worsening pain or lack of improvement
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
## 2023-06-14 ENCOUNTER — Ambulatory Visit: Payer: Medicaid Other

## 2023-06-14 NOTE — Progress Notes (Deleted)
    SUBJECTIVE:   CHIEF COMPLAINT / HPI:   Ear problem Reports several weeks of bilateral ear itching, occasionally feels pain with insertion of Q-tips Evaluated for this on 9/23, encouraged to use acetic acid hydrocortisone otic solution and recommended to stop using Q-tips Also noted to have TMJ disorder***  PERTINENT  PMH / PSH: ***  OBJECTIVE:   LMP 10/14/2022 (Approximate)   ***  ASSESSMENT/PLAN:   Assessment & Plan    Vonna Drafts, MD [facility]

## 2023-07-12 ENCOUNTER — Ambulatory Visit (INDEPENDENT_AMBULATORY_CARE_PROVIDER_SITE_OTHER): Payer: Medicaid Other | Admitting: Family Medicine

## 2023-07-12 VITALS — BP 116/71 | HR 89 | Ht 64.0 in | Wt 207.5 lb

## 2023-07-12 DIAGNOSIS — Z3200 Encounter for pregnancy test, result unknown: Secondary | ICD-10-CM

## 2023-07-12 DIAGNOSIS — Z3201 Encounter for pregnancy test, result positive: Secondary | ICD-10-CM | POA: Diagnosis not present

## 2023-07-12 LAB — POCT URINE PREGNANCY: Preg Test, Ur: POSITIVE — AB

## 2023-07-12 NOTE — Progress Notes (Signed)
    SUBJECTIVE:   CHIEF COMPLAINT / HPI:   Here for pregnancy test, G2P1011 Has been having some nausea recently and feels just like she did during her first pregnancy Was pregnant earlier this year but proceeded with termination of the pregnancy Unsure when her last period was, thinks mid September 9/16 Not on any birth control, not interested in starting  Positive preg test in clinic Unexpected pregnancy but she is wanting to proceed with the pregnancy Not currently on any medications No recent vaginal bleeding  Interested in counseling resources with news of pregnancy   PERTINENT  PMH / PSH: G2P1011  OBJECTIVE:   BP 116/71   Pulse 89   Ht 5\' 4"  (1.626 m)   Wt 207 lb 8 oz (94.1 kg)   LMP 05/30/2023 (Approximate)   SpO2 100%   BMI 35.62 kg/m    General: NAD Cardiac: RRR, no murmurs auscultated Respiratory: CTAB, normal WOB Abdomen: soft, non-tender, non-distended, normoactive bowel sounds Extremities: warm and well perfused, no edema or cyanosis Skin: warm and dry, no rashes noted Neuro: alert, no obvious focal deficits, speech normal Psych: tearful  ASSESSMENT/PLAN:   Assessment & Plan Positive pregnancy test Positive pregnancy test today, roughly 6 weeks according to reported LMP.  Patient would like to proceed with prenatal care.  Advised to start prenatal vitamin.  Will check initial prenatal labs today, advised to schedule follow-up soon for initial prenatal visit.  Ordered dating ultrasound as well, unable to schedule this in clinic today as the schedulers did not answer the phone but our staff will try again soon and coordinate with patient.  Additionally, provided patient with counseling resources for Medicaid.   Vonna Drafts, MD [facility]

## 2023-07-12 NOTE — Patient Instructions (Addendum)
Please see below for a list of therapy and counseling resources that accept Medicaid  Larey Seat let you know if any of your results from today are abnormal.  If everything looks good, I will send you message on MyChart.  I have ordered an initial dating ultrasound  Please schedule an initial prenatal visit at your earliest convenience   Therapy and Counseling Resources Most providers on this list will take Medicaid. Patients with commercial insurance or Medicare should contact their insurance company to get a list of in network providers.  [facility] [employer] (takes children) Location 1: [address] [place], [place] [address] Location 2: [address] [place], [place] [address] [phone]   [facility] (spanish speaking therapist available)(habla espanol)(take medicare and medicaid)  [address], [place], [place] [address], [email or web address] [phone]  [facility]ing [address] [place], [place] [address] [phone]  [facility]   [address], Suite [place], [place] [address]      [phone]  [facility] (spanish available) [address]  [place], [place] [address] [phone]  [facility] (take [facility] and medicare) [address], [address]  [place], [place] [address]       [phone]     [facility] (virtual only) [phone]  Jovita Kussmaul [facility] [address] [place], [place], [place] [phone]  [facility]:  [address] [place] [phone]  [facility]:  [address] [place] [phone]  [facility] (under & uninsured) [address]   [place] [phone]    [email or web address]    [facility] [address]  [place]    [phone]  [facility] -[address]      Phone:  [phone]     [facility]-  [address]  [phone]   [facility] [address]      [place], [place] [phone]  [facility]: [address] [place], [place], [place]  [phone]   [facility] (Spanish therapist) [email or web address]  [address]   [place] [address]    [phone]    [facility]   [address],  [place], [place]  [phone]   [facility]  [address] [place]  [phone]  [facility]  [address] [place], [place]        [phone]  Open Access/Walk In Clinic under & uninsured  [facility]  [address] [place], [place] [phone] Crisis [phone]  [facility] [place],  (Spanish)   [address], [place]: [phone]) 8:30 - 12; 1 - 2:30  [facility] [employer],  [address] [place], [place]    ([phone]):8:30 - 12; 2 - 3PM  [facility] [employer],  [address]  [place]; [phone]):   Mon - Fri 8 AM - 5 PM  [facility] [address] [place]  MWF 12:30 to 3:00 or call to schedule an appointment  [phone]  Specific Provider options Psychology Today  [email or web address] click on find a therapist  enter your zip code left side and select or tailor a therapist for your specific need.   [facility] Provider Directory [email or web address]  (Medicaid)   Follow all  drop down to find a provider  Social Support program [facility] [place] [phone] or PhotoSolver.pl [address], [place], [place] Recovery support and educational   24- Hour Availability:   [facility]  [address] [place], [place] [phone] Crisis [phone]  [facility]  [phone]  [place] [facility]  [phone]   [facility]  [phone] (after hours)  Therapeutic Alternative/Mobile Crisis   [phone]  [place] National Suicide Hotline  [phone] Len Childs)  Call 911 or go to emergency room  [facility]  [phone]);  [place] and [employer]  [phone]); [place], [place], [place], [place], [place], [place], [place]

## 2023-07-13 ENCOUNTER — Encounter: Payer: Self-pay | Admitting: Family Medicine

## 2023-07-13 ENCOUNTER — Telehealth: Payer: Self-pay

## 2023-07-13 DIAGNOSIS — Z3201 Encounter for pregnancy test, result positive: Secondary | ICD-10-CM

## 2023-07-13 NOTE — Telephone Encounter (Signed)
Spoke with patient . Informed her that her ultra sound for set for Mon 11/4 at 9:15a. At [address] Patient understood . Aquilla Solian, CMA

## 2023-07-13 NOTE — Telephone Encounter (Signed)
-----   Message from [facility] Jasmine December S sent at 07/13/2023  9:03 AM EDT ----- Ok to schedule [place] at [facility].  Clemencia Course, CMA

## 2023-07-14 LAB — CBC/D/PLT+RPR+RH+ABO+RUBIGG...
Antibody Screen: NEGATIVE
Basophils Absolute: 0.1 10*3/uL (ref 0.0–0.2)
Basos: 1 %
Bilirubin, UA: NEGATIVE
EOS (ABSOLUTE): 0.1 10*3/uL (ref 0.0–0.4)
Eos: 1 %
Glucose, UA: NEGATIVE
HCV Ab: NONREACTIVE
HIV Screen 4th Generation wRfx: NONREACTIVE
Hematocrit: 40.1 % (ref 34.0–46.6)
Hemoglobin: 13.3 g/dL (ref 11.1–15.9)
Hepatitis B Surface Ag: NEGATIVE
Immature Grans (Abs): 0 10*3/uL (ref 0.0–0.1)
Immature Granulocytes: 0 %
Leukocytes,UA: NEGATIVE
Lymphocytes Absolute: 2.1 10*3/uL (ref 0.7–3.1)
Lymphs: 31 %
MCH: 30.1 pg (ref 26.6–33.0)
MCHC: 33.2 g/dL (ref 31.5–35.7)
MCV: 91 fL (ref 79–97)
Monocytes Absolute: 0.7 10*3/uL (ref 0.1–0.9)
Monocytes: 10 %
Neutrophils Absolute: 3.9 10*3/uL (ref 1.4–7.0)
Neutrophils: 57 %
Nitrite, UA: NEGATIVE
Platelets: 417 10*3/uL (ref 150–450)
RBC, UA: NEGATIVE
RBC: 4.42 x10E6/uL (ref 3.77–5.28)
RDW: 12.1 % (ref 11.7–15.4)
RPR Ser Ql: NONREACTIVE
Rh Factor: POSITIVE
Rubella Antibodies, IGG: 1.64 {index} (ref >0.99)
Specific Gravity, UA: >=1.03 — AB (ref 1.005–1.030)
Urobilinogen, Ur: 1 mg/dL (ref 0.2–1.0)
WBC: 6.8 10*3/uL (ref 3.4–10.8)
pH, UA: 6 (ref 5.0–7.5)

## 2023-07-14 LAB — HCV INTERPRETATION

## 2023-07-14 LAB — VARICELLA ZOSTER ANTIBODY, IGG: Varicella zoster IgG: REACTIVE

## 2023-07-14 LAB — HGB FRACTIONATION CASCADE
Hgb A2: 3 % (ref 1.8–3.2)
Hgb A: 97 % (ref 96.4–98.8)
Hgb F: 0 % (ref 0.0–2.0)
Hgb S: 0 %

## 2023-07-14 LAB — MICROSCOPIC EXAMINATION
Casts: NONE SEEN /[LPF]
Epithelial Cells (non renal): >10 /[HPF] — AB (ref 0–10)
RBC, Urine: NONE SEEN /[HPF] (ref 0–2)
WBC, UA: NONE SEEN /[HPF] (ref 0–5)

## 2023-07-14 LAB — URINE CULTURE, OB REFLEX

## 2023-07-18 ENCOUNTER — Other Ambulatory Visit: Payer: Self-pay

## 2023-07-18 ENCOUNTER — Ambulatory Visit (INDEPENDENT_AMBULATORY_CARE_PROVIDER_SITE_OTHER): Payer: Medicaid Other

## 2023-07-18 DIAGNOSIS — Z3A01 Less than 8 weeks gestation of pregnancy: Secondary | ICD-10-CM | POA: Diagnosis not present

## 2023-07-18 DIAGNOSIS — Z3201 Encounter for pregnancy test, result positive: Secondary | ICD-10-CM

## 2023-07-18 DIAGNOSIS — Z3491 Encounter for supervision of normal pregnancy, unspecified, first trimester: Secondary | ICD-10-CM

## 2023-08-01 NOTE — Progress Notes (Unsigned)
Patient Name: Rhonda Dixon Date of Birth: 08/16/2001 [facility] Initial Prenatal Visit  MARKEISHA BEAUBIEN is a 22 y.o. year old G3P1011 at [redacted]w[redacted]d who presents for her initial prenatal visit.  Pregnancy is not planned She reports breast tenderness, morning sickness, and nausea. Cramping lower abdomen--ongoing since before she found out pregnant. Not all the time every other day. Last 1 minute and goes away. Does have pain in pelvis with certain positions. She is not taking a prenatal vitamin.  She denies pelvic pain or vaginal bleeding.   Pregnancy Dating: The patient is dated by early ultrasound.  LMP: October or september Period is certain:  No.  Periods were regular:  Yes.  LMP was a typical period:  Yes.  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  Summary of prior pregnancies: Did well with first pregnancy no issues, fever during pregnancy Cesarean delivery: No  Gestational Diabetes:  No Hypertension in pregnancy: No History of preterm birth: No History of LGA/SGA infant:  No History of shoulder dystocia: No Indications for referral were reviewed, and the patient has no obstetric indications for  referral to High Risk OB Clinic at this time.   Social History: Partner's name: Allyson Sabal  Tobacco use: No Alcohol use:  No Other substance use:  No  Current Medications:  None  Reviewed and appropriate in pregnancy.   Genetic and Infection Screen: Flow Sheet Updated Yes  Prenatal Exam: Gen: Well nourished, well developed.  No distress.  Vitals noted. HEENT: Normocephalic, atraumatic.  CV: RRR no murmur, gallops or rubs Lungs: CTA B.  Normal respiratory effort without wheezes or rales. Abd: soft, NTND. +BS.  Uterus not appreciated above pelvis. GU: Normal external female genitalia without lesions.  Nl vaginal, well rugated without lesions. No vaginal discharge.  Bimanual exam: No adnexal mass or TTP. No CMT.  Uterus size  Ext: No clubbing, cyanosis or edema. Psych: Normal grooming and dress.  Not depressed or anxious appearing.  Normal thought content and process without flight of ideas or looseness of associations  Chaperone: Gillermina Phy, CMA  Fetal heart tones: not obtained due to gestation  Assessment/Plan:  AQSA VIK is a 22 y.o. G3P1011 at [redacted]w[redacted]d who presents to initiate prenatal care. She is doing well.  Current pregnancy issues include none.  Routine prenatal care: Dating ultrasound for dating tab updated. Pre-pregnancy weight updated. Expected weight gain this pregnancy is 15-25 pounds Prenatal labs reviewed, notable for normal. Indications for referral to HROB were reviewed and the patient does not meet criteria for referral.  Medication list reviewed and updated.  Recommended patient see a dentist for regular care.  Bleeding and pain precautions reviewed. Importance of prenatal vitamins reviewed.  Genetic screening offered. Patient opted for:  patient undecided, will address at future visit. The patient has the following indications for aspirinto begin 81 mg at 12-16 weeks: One high risk condition: no single high risk condition  MORE than one  moderate risk condition: obesity and identifies as African American  Aspirin was not  recommended today based upon above risk factors (one high risk condition or more than one moderate risk factor)  The patient will not be age 29 or over at time of delivery. Referral to genetic counseling was not offered today.  The patient has the following risk factors for preexisting diabetes: BMI > 25 and high risk ethnicity (Latino, [place] American, Native American, [place] Islander, Asian Naval architect) . An early 1 hour glucose tolerance test was ordered.  Pregnancy Medical Home and PHQ-9 forms completed, problems noted: No  2. Pregnancy issues include the following which were addressed today:  Early 1 hour glucose tolerance test obtained, ultrasound used for dating, aspirin recommended at 12 weeks, prenatal vitamins sent to pharmacy   Follow up 4 weeks for next prenatal visit.

## 2023-08-02 ENCOUNTER — Ambulatory Visit: Payer: Medicaid Other | Admitting: Student

## 2023-08-02 ENCOUNTER — Other Ambulatory Visit (HOSPITAL_COMMUNITY)
Admission: RE | Admit: 2023-08-02 | Discharge: 2023-08-02 | Disposition: A | Discharge status: Home / Self Care | Payer: Medicaid Other | Source: Ambulatory Visit | Attending: Family Medicine | Admitting: Family Medicine

## 2023-08-02 ENCOUNTER — Encounter: Payer: Self-pay | Admitting: Student

## 2023-08-02 VITALS — BP 104/72 | HR 92 | Wt 210.0 lb

## 2023-08-02 DIAGNOSIS — Z3A08 8 weeks gestation of pregnancy: Secondary | ICD-10-CM

## 2023-08-02 DIAGNOSIS — Z3491 Encounter for supervision of normal pregnancy, unspecified, first trimester: Secondary | ICD-10-CM | POA: Diagnosis not present

## 2023-08-02 DIAGNOSIS — Z113 Encounter for screening for infections with a predominantly sexual mode of transmission: Secondary | ICD-10-CM | POA: Diagnosis present

## 2023-08-02 LAB — POCT 1 HR PRENATAL GLUCOSE: Glucose 1 Hr Prenatal, POC: 98 mg/dL

## 2023-08-02 MED ORDER — PRENATAL 27-0.8 MG PO TABS: 1.0000 | ORAL_TABLET | Freq: Every day | ORAL | 0 refills | Status: DC

## 2023-08-02 NOTE — Patient Instructions (Addendum)
It was great to see you! Thank you for allowing me to participate in your care!   Our plans for today:  - Got swabs today - sent prenatal vitamins - Recommend aspirin 81 mg at 12 weeks - 1 hour glucose today - Congratulations on your pregnancy. You need to take prenatal vitamins each day. Continue to stay away from cigarettes! Also, avoid raw meats and cheeses as well as cat litter.    Safe Medications in Pregnancy   Acne:  Benzoyl Peroxide  Salicylic Acid   Backache/Headache:  Tylenol: 2 regular strength every 4 hours OR               2 Extra strength every 6 hours   Colds/Coughs/Allergies:  Benadryl (alcohol free) 25 mg every 6 hours as needed  Breath right strips  Claritin  Cepacol throat lozenges  Chloraseptic throat spray  Cold-Eeze- up to three times per day  Cough drops, alcohol free  Flonase (by prescription only)  Guaifenesin  Mucinex  Robitussin DM (plain only, alcohol free)  Saline nasal spray/drops  Sudafed (pseudoephedrine) & Actifed * use only after [redacted] weeks gestation and if you do not have high blood pressure  Tylenol  Vicks Vaporub  Zinc lozenges  Zyrtec   Constipation:  Colace  Ducolax suppositories  Fleet enema  Glycerin suppositories  Metamucil  Milk of magnesia  Miralax  Senokot  Smooth move tea   Diarrhea:  Kaopectate  Imodium A-D   *NO pepto Bismol   Hemorrhoids:  Anusol  Anusol HC  Preparation H  Tucks   Indigestion:  Tums  Maalox  Mylanta  Zantac  Pepcid   Insomnia:  Benadryl (alcohol free) 25mg  every 6 hours as needed  Tylenol PM  Unisom, no Gelcaps   Leg Cramps:  Tums  MagGel   Nausea/Vomiting:  Bonine  Dramamine  Emetrol  Ginger extract  Sea bands  Meclizine  Nausea medication to take during pregnancy:  Unisom (doxylamine succinate 25 mg tablets) Take one tablet daily at bedtime. If symptoms are not adequately controlled, the dose can be increased to a maximum recommended dose of two tablets daily (1/2  tablet in the morning, 1/2 tablet mid-afternoon and one at bedtime).  Vitamin B6 100mg  tablets. Take one tablet twice a day (up to 200 mg per day).   Skin Rashes:  Aveeno products  Benadryl cream or 25mg  every 6 hours as needed  Calamine Lotion  1% cortisone cream   Yeast infection:  Gyne-lotrimin 7  Monistat 7    **If taking multiple medications, please check labels to avoid duplicating the same active ingredients  **take medication as directed on the label  ** Do not exceed 4000 mg of tylenol in 24 hours  **Do not take medications that contain aspirin or ibuprofen             Take care and seek immediate care sooner if you develop any concerns.  Levin Erp, MD

## 2023-08-02 NOTE — Progress Notes (Signed)
Notes: Came into NEW OB visit to congratulate and meet mom. Discussed how [employer] can provide connection to resources in the area that mom may not be aware of as well as offer support throughout the longevity of the pregnancy. Patient consented to Prenatal Navigation. Main concern today was mom's mixed emotions about pregnancy. Partner/ Steffanie Rainwater was the only person mentioned when asked who she could depend on for support. Partner is also having mixed emotions about pregnancy. Worry seems to be surrounding having two children under the age of two while also continuing education. CN scheduled to chat with mom on Thursday 11/21.  Cylis Ayars Ladona Ridgel [facility] [employer] [facility] [place] Direct Dial: [phone]

## 2023-08-03 ENCOUNTER — Encounter: Payer: Self-pay | Admitting: Student

## 2023-08-03 LAB — CERVICOVAGINAL ANCILLARY ONLY
Chlamydia: NEGATIVE
Comment: NEGATIVE
Comment: NORMAL
Neisseria Gonorrhea: NEGATIVE

## 2023-08-04 ENCOUNTER — Telehealth: Payer: Self-pay

## 2023-08-15 NOTE — Telephone Encounter (Signed)
Navigator made phone call to mom on 11/21 as scheduled re: Prenatal Consent to begin Guided Conversation that helps assess current needs of client. Was not able to connect - left voicemail.  Rilla Buckman Ladona Ridgel Community Navigator [facility]

## 2023-08-22 ENCOUNTER — Ambulatory Visit: Payer: Medicaid Other | Admitting: Student

## 2023-08-22 ENCOUNTER — Encounter: Payer: Self-pay | Admitting: Student

## 2023-08-22 ENCOUNTER — Other Ambulatory Visit (HOSPITAL_COMMUNITY)
Admission: RE | Admit: 2023-08-22 | Discharge: 2023-08-22 | Disposition: A | Discharge status: Home / Self Care | Payer: Medicaid Other | Source: Ambulatory Visit | Attending: Family Medicine | Admitting: Family Medicine

## 2023-08-22 VITALS — BP 122/79 | HR 108 | Ht 64.0 in | Wt 209.0 lb

## 2023-08-22 DIAGNOSIS — Z3A08 8 weeks gestation of pregnancy: Secondary | ICD-10-CM

## 2023-08-22 DIAGNOSIS — N898 Other specified noninflammatory disorders of vagina: Secondary | ICD-10-CM | POA: Diagnosis present

## 2023-08-22 LAB — POCT WET PREP (WET MOUNT)
Clue Cells Wet Prep Whiff POC: NEGATIVE
Trichomonas Wet Prep HPF POC: ABSENT

## 2023-08-22 MED ORDER — PRENATAL 27-0.8 MG PO TABS: 1.0000 | ORAL_TABLET | Freq: Every day | ORAL | 0 refills | Status: DC

## 2023-08-22 NOTE — Patient Instructions (Signed)
It was great to see you! Thank you for allowing me to participate in your care!   Our plans for today:  - I will let you know what your swabs show! Prenatal Classes Go to [email or web address] for more information on the pregnancy and child birth classes that [facility] has to offer.   Pregnancy Related Return Precautions The follow are signs/symptoms that are abnormal in pregnancy and may require further evaluation by a physician: Go to the MAU at [facility] at [facility] if: You have cramping/contractions that do not go away with drinking water, especially if they are lasting 30 seconds to 1.5 minutes, coming and going every 5-10 minutes for an hour or more, or are getting stronger and you cannot walk or talk while having a contraction/cramp. Your water breaks.  Sometimes it is a big gush of fluid, sometimes it is just a trickle that keeps getting your underwear wet or running down your legs You have vaginal bleeding.    You have a persistent headache that does not go away with 1 g of Tylenol, vision changes, chest pain, difficulty breathing, severe pain in your right upper abdomen, worsening leg swelling- these can all be signs of high blood pressure in pregnancy and need to be evaluated by a provider immediately  These are all concerning in pregnancy and if you have any of these I recommend you call your PCP and present to the Maternity Admissions Unit (map below) for further evaluation.  For any pregnancy-related emergencies, please go to the Maternity Admissions Unit in the Women's & Children's Center at [facility]. You will use hospital Entrance C.    Our clinic number is [phone].     Take care and seek immediate care sooner if you develop any concerns.  Levin Erp, MD

## 2023-08-22 NOTE — Progress Notes (Signed)
    SUBJECTIVE:   CHIEF COMPLAINT / HPI:   Vaginal Discharge: Patient is a 22 y.o. female G3P1011 at [redacted]w[redacted]d presenting with vaginal discharge for 3 days.  She states the discharge is of white clumpy consistency.  She endorses new vaginal odor.  Denies any clear thin liquid/water breaking. Denies any bleeding.  PERTINENT  PMH / PSH: None relevant  OBJECTIVE:   BP 122/79   Pulse (!) 108   Ht 5\' 4"  (1.626 m)   Wt 209 lb (94.8 kg)   LMP  (LMP Unknown)   SpO2 100%   BMI 35.87 kg/m    General: NAD, pleasant, able to participate in exam Respiratory: Normal effort, no obvious respiratory distress Pelvic: VULVA: normal appearing vulva with no masses, tenderness or lesions, VAGINA: Normal appearing vagina with normal color, no lesions, with white discharge present, CERVIX: No lesions, scant discharge present  Chaperone Gillermina Phy CMA present for pelvic exam  ASSESSMENT/PLAN:   Assessment & Plan Vaginal discharge Physical exam significant for white discharge.  Wet prep performed today shows   moderate bacteria without signs of BV, yeast or trichomonas Plan: -Wet prep as above.   -G/C -Follow-up as needed   Levin Erp, MD [facility]

## 2023-08-22 NOTE — Assessment & Plan Note (Addendum)
Physical exam significant for white discharge.  Wet prep performed today shows   moderate bacteria without signs of BV, yeast or trichomonas Plan: -Wet prep as above.   -G/C -Follow-up as needed

## 2023-08-23 LAB — CERVICOVAGINAL ANCILLARY ONLY
Chlamydia: NEGATIVE
Comment: NEGATIVE
Comment: NORMAL
Neisseria Gonorrhea: NEGATIVE

## 2023-08-24 ENCOUNTER — Telehealth: Payer: Self-pay

## 2023-08-24 NOTE — Telephone Encounter (Signed)
Reached out to mom to try and schedule GC since CN had not been able to reach her in past weeks. Successfully reached mom today and scheduled GC for 12/13 at 11:30. Also added son's postnatal consent for Navigation. Mother mentioned feeling down, tired and having a lot going on in regards to school and work. Previously, mom mentioned her and partner not being as happy about pregnancy. CN encouraged mom to reach out to support system or provider to discuss current emotions.  Resources Provided: [facility] Self Care Handout  Cornella Emmer [employer] Community Navigator [facility] [place] Direct Dial: [phone]

## 2023-08-26 ENCOUNTER — Telehealth: Payer: Self-pay

## 2023-08-26 IMAGING — US US MFM OB FOLLOW-UP
1 series · 14 of 28 positions shown · non-contrast
Comparison: none

[Series 1: us mfm ob follow-up · 14 of 65 slices shown]
[im 3/65]
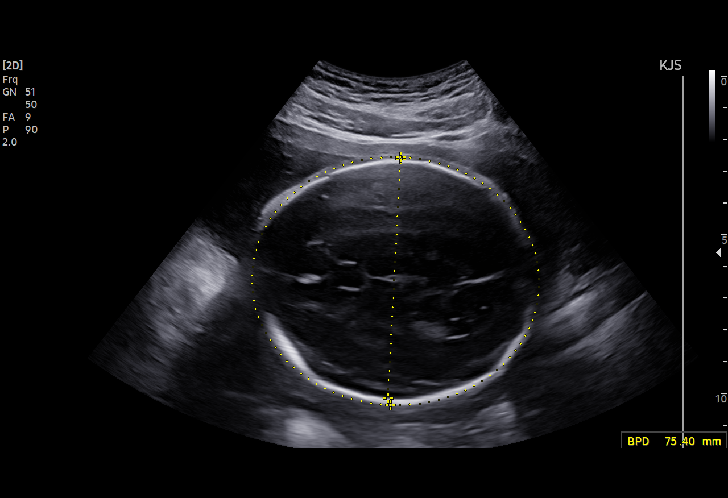
[im 8/65]
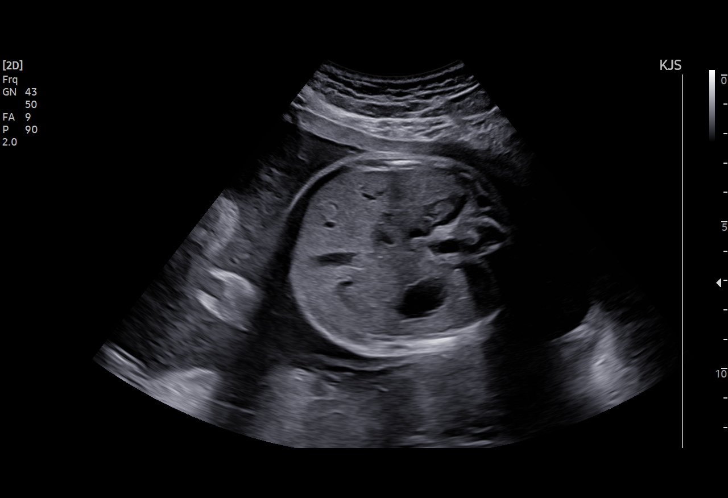
[im 12/65]
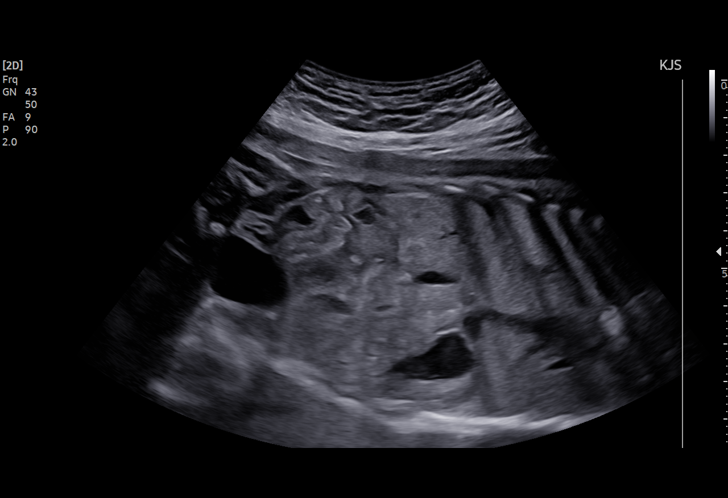
[im 17/65]
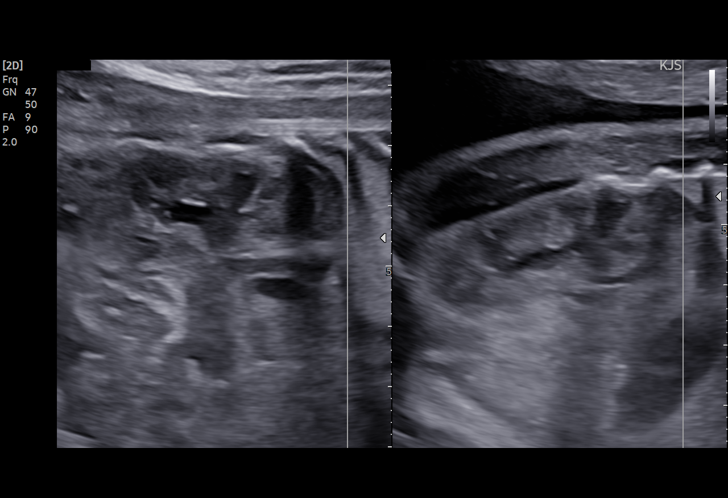
[im 22/65]
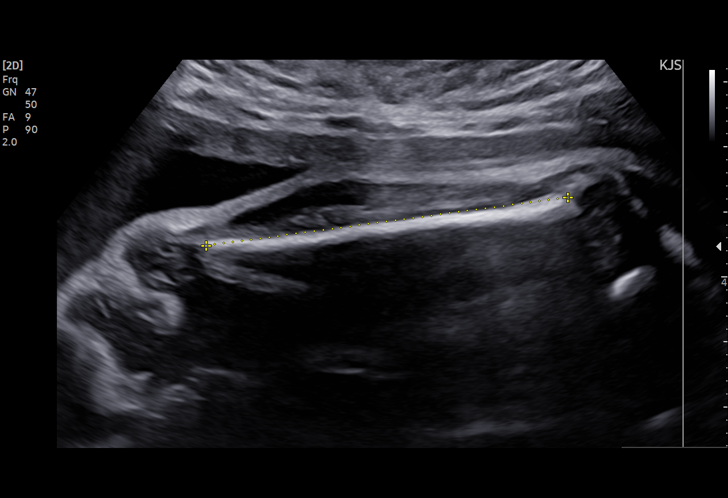
[im 27/65]
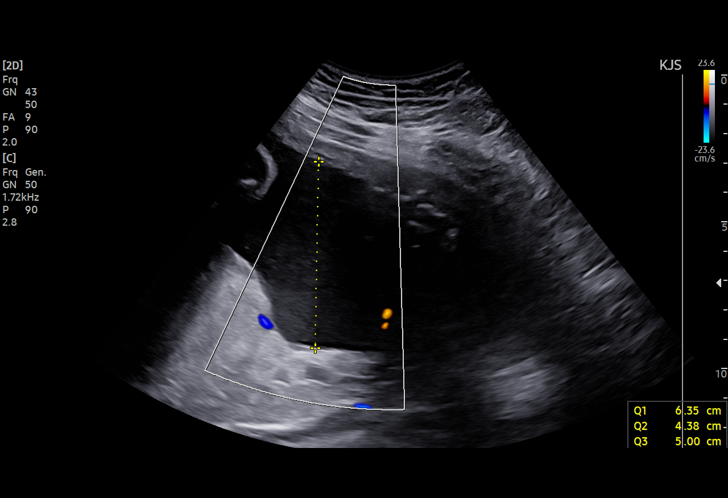
[im 31/65]
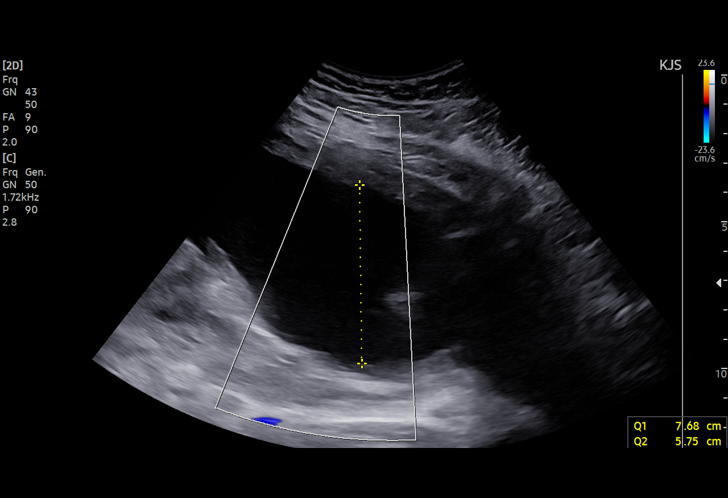
[im 36/65]
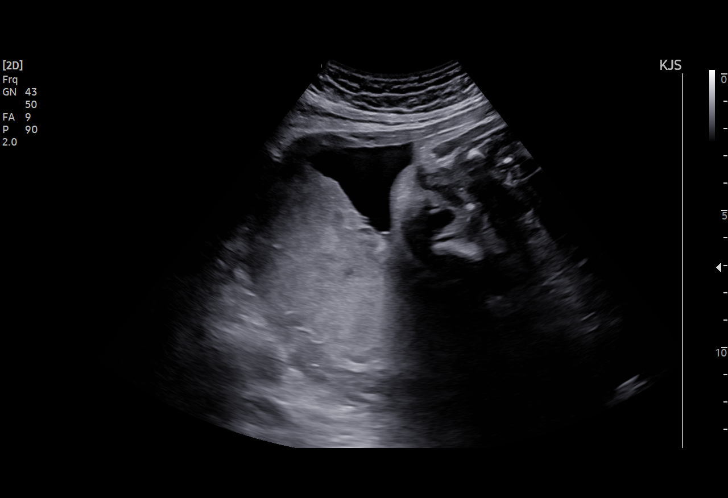
[im 41/65]
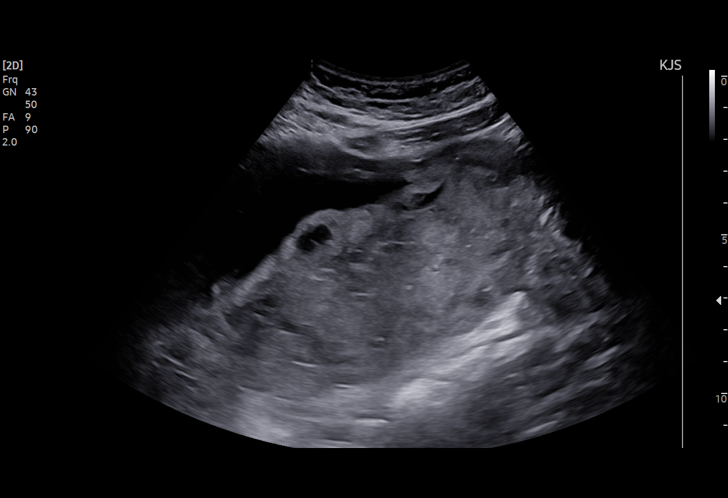
[im 46/65]
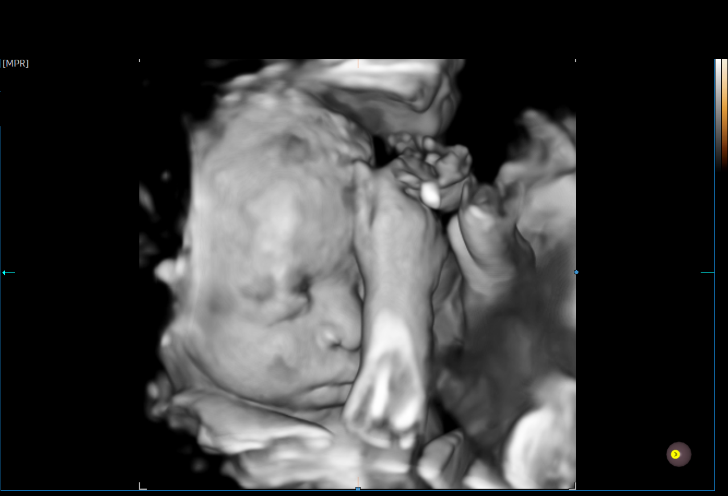
[im 50/65]
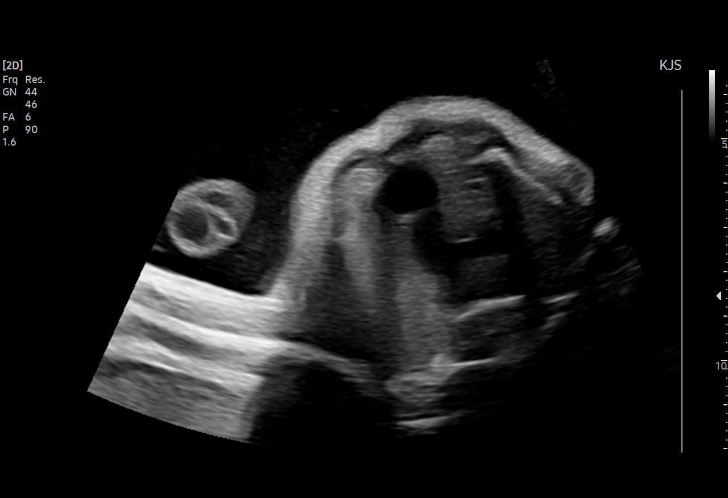
[im 55/65]
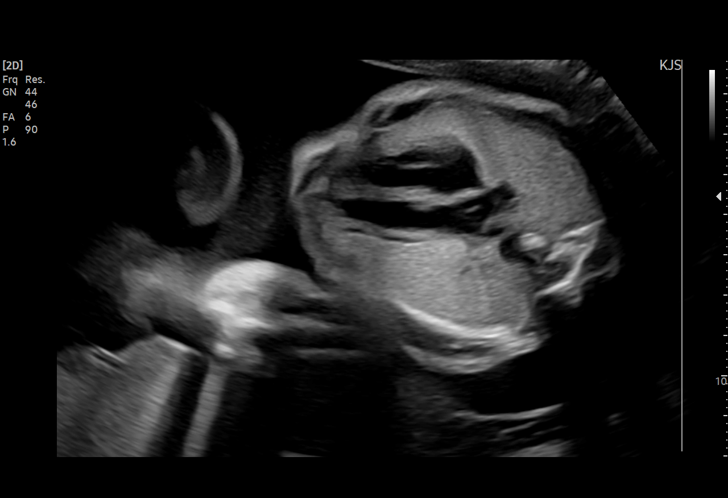
[im 60/65]
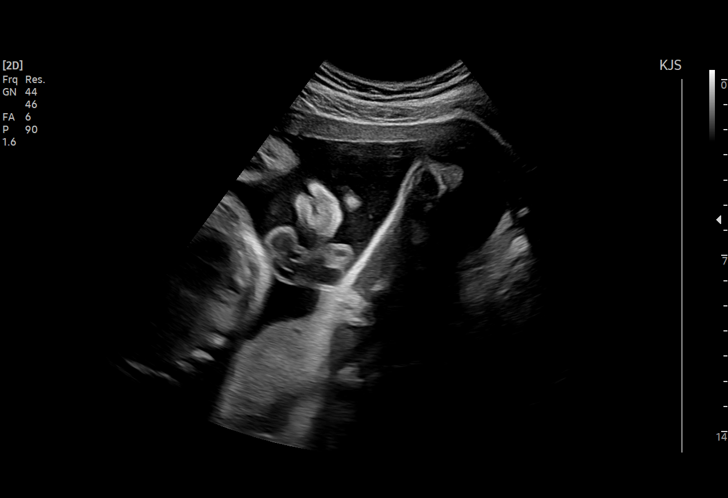
[im 65/65]
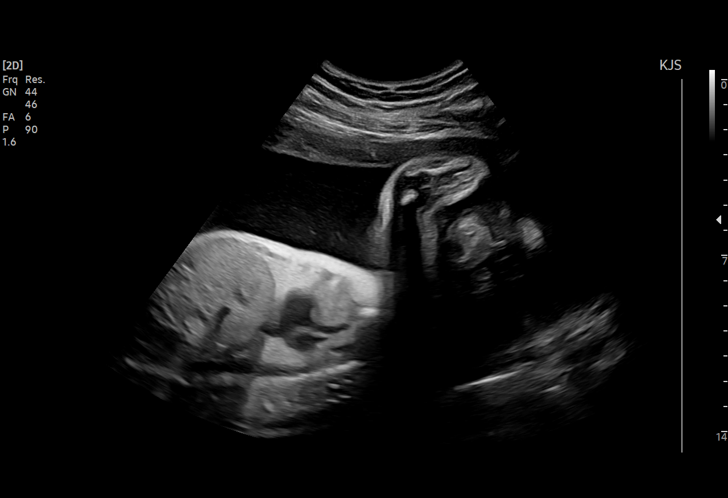

[14 of 28 positions shown; findings below may reference images not displayed]

[REDACTED]. [HOSPITAL],
                   BHEBHE CNM

                                                      DANII

Indications

 Obesity complicating pregnancy, second
 trimester (BMI 32)
 27 weeks gestation of pregnancy
 Encounter for other antenatal screening
 follow-up
 LR NIPS/Horizon-Neg
Fetal Evaluation

 Num Of Fetuses:         1
 Fetal Heart Rate(bpm):  155
 Cardiac Activity:       Observed
 Placenta:               Posterior
 P. Cord Insertion:      Previously Visualized

 Amniotic Fluid
 AFI FV:      Subjectively increased

 AFI Sum(cm)     %Tile       Largest Pocket(cm)
 24.99           > 97

 RUQ(cm)       RLQ(cm)       LUQ(cm)        LLQ(cm)

Biometry
 BPD:     75.33  mm     G. Age:  30w 2d         96  %    CI:        79.67   %    70 - 86
                                                         FL/HC:      20.8   %    18.8 -
 HC:    266.72   mm     G. Age:  29w 0d         58  %    HC/AC:      1.09        1.05 -
 AC:    244.09   mm     G. Age:  28w 5d         67  %    FL/BPD:     73.8   %    71 - 87
 FL:      55.58  mm     G. Age:  29w 2d         76  %    FL/AC:      22.8   %    20 - 24

 LV:        4.7  mm

 Est. FW:    8008  gm    2 lb 15 oz      81  %
Gestational Age

 LMP:           29w 2d        Date:  07/28/21                 EDD:   05/04/22
 U/S Today:     29w 2d                                        EDD:   05/04/22
 Best:          27w 6d     Det. By:  Early Ultrasound         EDD:   05/14/22
                                     (10/12/21)
Anatomy

 Cranium:               Appears normal         LVOT:                   Previously seen
 Cavum:                 Appears normal         Aortic Arch:            Previously seen
 Ventricles:            Appears normal         Ductal Arch:            Not well visualized
 Choroid Plexus:        Previously seen        Diaphragm:              Appears normal
 Cerebellum:            Previously seen        Stomach:                Appears normal, left
                                                                       sided
 Posterior Fossa:       Previously seen        Abdomen:                Appears normal
 Nuchal Fold:           Previously seen        Abdominal Wall:         Previously seen
 Face:                  Orbits and profile     Cord Vessels:           Previously seen
                        previously seen
 Lips:                  Previously seen        Kidneys:                Appear normal
 Palate:                Not well visualized    Bladder:                Appears normal
 Thoracic:              Previously seen        Spine:                  Previously seen
 Heart:                 Previously seen        Upper Extremities:      Previously seen
 RVOT:                  Previously seen        Lower Extremities:      Previously seen

 Other:  VC, 3VV and 3VTV, Nasal bone, lenses, maxilla, mandible and falx,
         Hands and feet previously visualized. Male gender previously seen.
Cervix Uterus Adnexa

 Cervix
 Not visualized (advanced GA >68wks)
Comments

 This patient was seen for a follow up growth scan due to
 maternal obesity.  She denies any problems since her last
 exam.
 She was informed that the fetal growth appears appropriate
 for her gestational age. Borderline polyhydramnios with a
 total AFI of 25 cm is noted.
 Due to maternal obesity, a follow-up growth scan was
 scheduled in 5 weeks.
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
## 2023-09-11 ENCOUNTER — Encounter (HOSPITAL_COMMUNITY): Payer: Self-pay

## 2023-09-11 ENCOUNTER — Ambulatory Visit (HOSPITAL_COMMUNITY)
Admission: EM | Admit: 2023-09-11 | Discharge: 2023-09-11 | Disposition: A | Discharge status: Home / Self Care | Payer: Medicaid Other | Attending: Family Medicine | Admitting: Family Medicine

## 2023-09-11 DIAGNOSIS — J069 Acute upper respiratory infection, unspecified: Secondary | ICD-10-CM | POA: Diagnosis not present

## 2023-09-11 DIAGNOSIS — J029 Acute pharyngitis, unspecified: Secondary | ICD-10-CM

## 2023-09-11 LAB — POC COVID19/FLU A&B COMBO
Covid Antigen, POC: NEGATIVE
Influenza A Antigen, POC: NEGATIVE
Influenza B Antigen, POC: NEGATIVE

## 2023-09-11 LAB — POCT RAPID STREP A (OFFICE): Rapid Strep A Screen: NEGATIVE

## 2023-09-11 NOTE — ED Triage Notes (Addendum)
Pt presents with complaints of sore throat x 3 days. Pt states "the pain is intense, it has radiated into my neck and into my chest, I am coughing up green sputum." Pt currently rates her pain a 10/10. Pt reports taking a Tylenol when symptoms began with no improvement. Pt would also like [place] to know that she is [redacted] weeks pregnant. Pt also has cough in triage.

## 2023-09-11 NOTE — Discharge Instructions (Signed)
It was nice seeing you. I am sorry about your symptoms. Thankfully your influenza and COVID test are negative. We agreed to hold off on chest xray as this is fairly acute symptoms. Please schedule PCP follow up this coming week for reassessment or return to [place] soon if symptoms worsens.

## 2023-09-11 NOTE — ED Provider Notes (Signed)
[facility]    CSN: [id number] Arrival date & time: 09/11/23  1023      History   Chief Complaint Chief Complaint  Patient presents with   Sore Throat    HPI Rhonda Dixon is a 22 y.o. female.   The history is provided by the patient. No language interpreter was used.  Sore Throat This is a new problem. Episode onset: throat pain started 4 days ago. The problem occurs constantly. The problem has been gradually worsening. Associated symptoms include chest pain and shortness of breath. Pertinent negatives include no abdominal pain and no headaches. Associated symptoms comments: She has chest soreness with coughing and also chest and throat pain with swallowing. She had a headache few days ago which resolved with Tylenol. When she lay down at night it is hard for her to breath. Denies ear ain. The symptoms are aggravated by swallowing and coughing. Relieved by: Drinking water.  Cough Cough characteristics:  Productive Sputum characteristics:  Yellow and green Onset quality:  Gradual Duration:  2 days Timing:  Intermittent Progression:  Worsening Chronicity:  New Context: not sick contacts and not smoke exposure   Relieved by:  Nothing Worsened by:  Nothing Ineffective treatments: She did not try any medication as she is pregnant. 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   Pregnancy 11/30/2022   Cold sore 10/11/2022   History of abnormal cervical Pap smear 04/28/2022   Encounter for counseling regarding contraception 07/23/2021   Generalized anxiety disorder 10/23/2018   Vaginal discharge 07/12/2017   ALLERGIC RHINITIS,  SEASONAL 04/01/2009    Past Surgical History:  Procedure Laterality Date   INDUCED ABORTION  12/2022   NO PAST SURGERIES      OB History     Gravida  3   Para  1   Term  1   Preterm      AB  1   Living  1      SAB      IAB      Ectopic      Multiple  0   Live Births  1            Home Medications    Prior to Admission medications   Medication Sig Start Date End Date Taking? Authorizing Provider  acetic acid-hydrocortisone (VOSOL-HC) OTIC solution Place 3 drops into both ears 3 (three) times daily. For 5 days, please stop if pain or itching or irritation is worsening. 06/06/23   Shitarev, Dimitry, MD  Prenatal Vit-Fe Fumarate-FA (MULTIVITAMIN-PRENATAL) 27-0.8 MG TABS tablet Take 1 tablet by mouth daily at 12 noon. 08/22/23   Levin Erp, MD  escitalopram (LEXAPRO) 10 MG tablet Take 0.5 tablets (5 mg total) by mouth daily. 10/23/18 01/17/20  Lennox Solders, MD  FLUoxetine (PROZAC) 20 MG tablet Take 1 tablet (20 mg total) by mouth daily. 12/18/19 01/17/20  Mirian Mo, MD  ipratropium (ATROVENT) 0.06 % nasal spray Place 2 sprays into both nostrils 4 (four) times daily. 07/26/18 01/17/20  Belinda Fisher, PA-C  omeprazole (PRILOSEC) 20 MG capsule Take 1 capsule (20 mg total) by mouth daily. 08/29/18 06/26/19  Tillman Sers, DO    Family History Family History  Problem Relation Age of Onset   Asthma Mother    Lupus Mother    Autoimmune disease Mother     Social History Social History   Tobacco Use   Smoking status: Never   Smokeless tobacco: Never  Vaping Use   Vaping status: Former   Substances: Nicotine, Flavoring  Substance Use Topics   Alcohol use: Not Currently    Comment: occ   Drug use: No     Allergies   Patient has no known allergies.   Review of Systems Review of Systems  Constitutional:  Negative for fever.  HENT:  Positive for sore throat. Negative for ear pain.   Respiratory:  Positive for cough and shortness of breath. Negative  for wheezing.   Cardiovascular:  Positive for chest pain.  Gastrointestinal:  Negative for abdominal pain.  Neurological:  Negative for headaches.  All other systems reviewed and are negative.    Physical Exam Triage Vital Signs ED Triage Vitals  Encounter Vitals Group     BP 09/11/23 1138 110/75     Systolic BP Percentile --      Diastolic BP Percentile --      Pulse Rate 09/11/23 1138 97     Resp 09/11/23 1138 18     Temp 09/11/23 1138 98.3 F (36.8 C)     Temp Source 09/11/23 1138 Oral     SpO2 09/11/23 1138 98 %     Weight 09/11/23 1136 209 lb (94.8 kg)     Height 09/11/23 1136 5\' 4"  (1.626 m)     Head Circumference --      Peak Flow --      Pain Score 09/11/23 1136 10     Pain Loc --      Pain Education --      Exclude from Growth Chart --    No data found.  Updated Vital Signs BP 110/75 (BP Location: Right Arm)   Pulse 97   Temp 98.3 F (36.8 C) (Oral)   Resp 18   Ht 5\' 4"  (1.626 m)   Wt 94.8 kg   LMP  (LMP Unknown)   SpO2 98%   Breastfeeding No   BMI 35.87 kg/m   Visual Acuity Right Eye Distance:   Left Eye Distance:   Bilateral Distance:    Right Eye Near:   Left Eye Near:    Bilateral Near:     Physical Exam Vitals and nursing note reviewed.  Constitutional:      Appearance: She is not ill-appearing, toxic-appearing or diaphoretic.  HENT:     Mouth/Throat:     Mouth: Mucous membranes are moist. No oral lesions.     Pharynx: No pharyngeal swelling, oropharyngeal exudate or uvula swelling.     Comments: Mildly erythematous oropharynx Eyes:     Conjunctiva/sclera: Conjunctivae normal.  Cardiovascular:     Rate and Rhythm: Normal rate and regular rhythm.     Heart sounds: Normal heart sounds. No murmur heard. Pulmonary:     Effort: Pulmonary effort is normal. No respiratory distress.     Breath sounds: Normal breath sounds. No stridor. No wheezing.      UC Treatments / Results  Labs (all labs ordered are listed, but only abnormal  results are displayed) Labs Reviewed  POCT RAPID STREP A (OFFICE)  POC COVID19/FLU A&B COMBO    EKG   Radiology No results found.  Procedures Procedures (including critical care  time)  Medications Ordered in UC Medications - No data to display  Initial Impression / Assessment and Plan / UC Course  I have reviewed the triage vital signs and the nursing notes.  Pertinent labs & imaging results that were available during my care of the patient were reviewed by me and considered in my medical decision making (see chart for details).  Clinical Course as of 09/11/23 1229  Sun Sep 11, 2023  1228 Viral pharyngitis with cough Neg Rapid strep, Covid and influenza Holding off on chest xray as this is acute and her pulm exam is benign Use Tylenol and warm saline gurgle as needed for pain Return precautions discussed F/U with [facility] for pregnancy care She agreed with the plan. [KE]    Clinical Course User Index [KE] Doreene Eland, MD     Final Clinical Impressions(s) / UC Diagnoses   Final diagnoses:  Viral pharyngitis  Upper respiratory tract infection, unspecified type     Discharge Instructions      It was nice seeing you. I am sorry about your symptoms. Thankfully your influenza and COVID test are negative. We agreed to hold off on chest xray as this is fairly acute symptoms. Please schedule PCP follow up this coming week for reassessment or return to [place] soon if symptoms worsens.     ED Prescriptions   None    PDMP not reviewed this encounter.   Doreene Eland, MD 09/11/23 1229

## 2023-09-12 ENCOUNTER — Telehealth: Payer: Self-pay

## 2023-09-12 ENCOUNTER — Encounter: Payer: Self-pay | Admitting: Student

## 2023-09-12 NOTE — Telephone Encounter (Signed)
Called mom back. Discussed safe medications in pregnancy with cold. Theoretical risk of increasing blood pressure with phenylephrine in Theraflu. Will message patient in mychart list of safe meds and discussed on phone with mom as well.  Colds/Coughs/Allergies:  Benadryl (alcohol free) 25 mg every 6 hours as needed  Breath right strips  Claritin  Cepacol throat lozenges  Chloraseptic throat spray  Cold-Eeze- up to three times per day  Cough drops, alcohol free  Flonase (by prescription only)  Guaifenesin  Mucinex  Robitussin DM (plain only, alcohol free)  Saline nasal spray/drops  Sudafed (pseudoephedrine) & Actifed * use only after [redacted] weeks gestation and if you do not have high blood pressure  Tylenol  Vicks Vaporub  Zinc lozenges  Zyrtec

## 2023-09-12 NOTE — Telephone Encounter (Signed)
Patient's mother calls nurse line regarding questions with patient's current sick symptoms.   She has questions about current symptom management. Patient has been experiencing headaches, sore throat, cough and chest tightness when coughing.   She was seen in urgent care yesterday and has scheduled follow up with PCP on 09/14/22.  Supportive measures discussed. Mother is asking if patient could take Theraflu Severe Cold Relief. This is not on Pregnancy safe medication list, however, advised that I would forward message to provider for further advisement and cold medications during pregnancy.   Patient does not have a working phone. Mother will need to be called back at [phone].  Veronda Prude, RN

## 2023-09-14 NOTE — L&D Delivery Note (Addendum)
 LABOR COURSE Patient is a 23 yo G3P1011 presented for SOL. S/p AROM. Did not require further augmentation during labor course.  Delivery Note Called to room and patient was complete and pushing. Head delivered without concern. No nuchal cord present. Shoulder and body delivered in usual fashion.   At 1900 a viable female was delivered via Vaginal, Spontaneous (Presentation: LOA).  Infant with spontaneous cry, placed on mother's abdomen, dried and stimulated. Cord clamped x 2 after 1-minute delay, and cut by FOB. Cord blood drawn. Placenta delivered spontaneously with gentle cord traction. Appears intact. Fundus firm with massage and Pitocin . Labia, perineum, vagina, and cervix inspected with bilateral periurethral laceration. Repaired with 4-0 vicryl, hemostatic following repair.    APGAR: 9 at 1 min, 9 at 5 min  Cord: 3VC without complication  Anesthesia: Epidural Episiotomy: None Lacerations: Bilateral periurethral Suture Repair: 4-0 vicryl  Est. Blood Loss (mL): 50 cc (50 cc amniotic fluid)  Mom to postpartum.  Baby to Couplet care / Skin to Skin.  Claudene Wells LABOR, MD 03/06/2024 7:20 PM     Attestation of Supervision of Student: I was present, gloved, and participated in the entirety of delivery and third stage.  Almarie CHRISTELLA Moats, MD OB Fellow 03/06/2024 7:53 PM

## 2023-09-15 ENCOUNTER — Other Ambulatory Visit: Payer: Self-pay

## 2023-09-15 ENCOUNTER — Encounter: Payer: Self-pay | Admitting: Student

## 2023-09-15 ENCOUNTER — Ambulatory Visit (INDEPENDENT_AMBULATORY_CARE_PROVIDER_SITE_OTHER): Payer: Medicaid Other | Admitting: Student

## 2023-09-15 VITALS — BP 106/75 | HR 94 | Wt 208.8 lb

## 2023-09-15 DIAGNOSIS — R52 Pain, unspecified: Secondary | ICD-10-CM

## 2023-09-15 DIAGNOSIS — Z3A14 14 weeks gestation of pregnancy: Secondary | ICD-10-CM

## 2023-09-15 DIAGNOSIS — R519 Headache, unspecified: Secondary | ICD-10-CM

## 2023-09-15 MED ORDER — PYRIDOXINE HCL 10 MG PO TABS: 10.0000 mg | ORAL_TABLET | Freq: Every day | ORAL | 0 refills | Status: DC

## 2023-09-15 MED ORDER — FLUTICASONE PROPIONATE 50 MCG/ACT NA SUSP: 2.0000 | Freq: Every day | NASAL | 6 refills | Status: DC

## 2023-09-15 MED ORDER — LIDOCAINE 4 % EX PTCH: 1.0000 | MEDICATED_PATCH | CUTANEOUS | 0 refills | Status: DC

## 2023-09-15 MED ORDER — MAGNESIUM OXIDE 400 MG PO TABS: 400.0000 mg | ORAL_TABLET | Freq: Every day | ORAL | 0 refills | Status: DC

## 2023-09-15 MED ORDER — ASPIRIN 81 MG PO TBEC: 81.0000 mg | DELAYED_RELEASE_TABLET | Freq: Every day | ORAL | 12 refills | Status: DC

## 2023-09-15 NOTE — Patient Instructions (Addendum)
 Please make a follow up appointment in 4 weeks.  I have sent in flonase  for your congestion and headache Please continue tylenol  I am ordering lidocaine  patches for neck pain Please start aspirin  81 mg daily I am ordering anatomy ultrasound and genetic screening I am ordering pyridoxine  for nausea/vomiting  Prenatal Classes Go to [email or web address] for more information on the pregnancy and child birth classes that [place] has to offer.   Pregnancy Related Return Precautions The follow are signs/symptoms that are abnormal in pregnancy and may require further evaluation by a physician: Go to the MAU at [facility] if: You have cramping/contractions that do not go away with drinking water, especially if they are lasting 30 seconds to 1.5 minutes, coming and going every 5-10 minutes for an hour or more, or are getting stronger and you cannot walk or talk while having a contraction/cramp. Your water breaks.  Sometimes it is a big gush of fluid, sometimes it is just a trickle that keeps getting your underwear wet or running down your legs You have vaginal bleeding.    You have a persistent headache that does not go away with 1 g of Tylenol , vision changes, chest pain, difficulty breathing, severe pain in your right upper abdomen, worsening leg swelling- these can all be signs of high blood pressure in pregnancy and need to be evaluated by a provider immediately  These are all concerning in pregnancy and if you have any of these I recommend you call your PCP and present to the Maternity Admissions Unit (map below) for further evaluation.  For any pregnancy-related emergencies, please go to the Maternity Admissions Unit in the [facility] at [facility]. You will use hospital Entrance C.    Our clinic number is [phone].

## 2023-09-15 NOTE — Progress Notes (Addendum)
 Patient Name: Rhonda Dixon Date of Birth: [birth year] [facility] Prenatal Visit  Rhonda Dixon is a 23 y.o. G3P1011 at [redacted]w[redacted]d here for routine follow up. She is dated by early ultrasound.  She reports  complaints listed below .  She denies current vaginal bleeding.  See flow sheet for details.No leakign of fluid. No contractions.  Some vaginal spotting x 1 time right before she got sick 1 week ago, none since Cramping sensations in stomach after eating food but no contractions or leaking of fluid  Recent illness for viral pharyngitis seen in urgent care 12/29-negative strep, COVID, flu Headaches started 1 week ago along with viral illness--pretty congested--blowing nose a lot, Tylenol  helps No vision changes No chest pain or SOB currently (did have coughing induced chest pain at when coughing happened, coughing a good amount) In front of head is where it hurts--some vomiting with food everytime she eats Down 1 pound since urgent care visit Keeping fluids down okay  Vitals:   09/15/23 1049  BP: 106/75  Pulse: 94   General: Ill but nontoxic, NAD, awake, alert, responsive to questions Head: Normocephalic atraumatic, nasal turbinates swollen b/l, no adenopathy, oropharynx clear CV: Regular rate and rhythm no murmurs rubs or gallops Respiratory: Clear to ausculation bilaterally, no wheezes rales or crackles, chest rises symmetrically,  no increased work of breathing Abdomen: Soft, non-tender, non-distended, normoactive bowel sounds  Extremities: Moves upper and lower extremities freely, no edema in LE, no calf tenderness Neuro: CN II: PERRL CN III, IV,VI: EOMI CV V: Normal sensation in V1, V2, V3 CVII: Symmetric smile and brow raise CN VIII: Normal hearing CN IX,X: Symmetric palate raise  CN XI: 5/5 shoulder shrug CN XII: Symmetric tongue protrusion  UE and LE strength 5/5 Normal sensation in UE and LE bilaterally   A/P: Pregnancy at [redacted]w[redacted]d.  Doing well.     Routine Prenatal Care:  Dating reviewed, dating tab is correct Fetal heart tones Appropriate Influenza vaccine not administered today COVID vaccination was not discussed The patient has the following indication for screening preexisting diabetes: 1 hour glucose good Anatomy ultrasound ordered to be scheduled at 18-20 weeks. Patient is interested in genetic screening. As she is past 13 weeks and 6 days, a Quad screen  was offered. Does not want to know gender but does want to know genetic screen results- would like gender in sealed envelope to pick up from clinic Pregnancy education including expected weight gain in pregnancy, OTC medication use, continued use of prenatal vitamin, smoking cessation if applicable, and nutrition in pregnancy.   Bleeding and pain precautions reviewed. The patient has the following indications for aspirinto begin 81 mg at 12-16 weeks: One high risk condition: no single high risk condition  MORE than one moderate risk condition: obesity and identifies as African American  Aspirin  was  recommended today based upon above risk factors (one high risk condition or more than one moderate risk factor)   2. Pregnancy issues include the following and were addressed as appropriate today:  Dating ultrasound ordered Panorama ordered per patient request does not want to know the gender Assessment & Plan Sinus headache Most likely sinus headache given significant congestion and coinciding with start of her illness last week.  No red flag symptoms for preeclampsia no history of preeclampsia in her prior pregnancy.  I did recommend starting aspirin  for her today and symptomatic measures to help with her headache currently. - fluticasone  (FLONASE )  - magnesium  oxide (MAG-OX) 400 MG  tablet PRN for headaches, patient messaged  Body aches - lidocaine  (HM LIDOCAINE  PATCH) 4 %   Problem list  and pregnancy box updated: Yes.   Follow up 4 weeks.

## 2023-09-19 ENCOUNTER — Other Ambulatory Visit: Payer: Medicaid Other

## 2023-09-19 DIAGNOSIS — Z3402 Encounter for supervision of normal first pregnancy, second trimester: Secondary | ICD-10-CM | POA: Diagnosis not present

## 2023-09-19 DIAGNOSIS — Z3A14 14 weeks gestation of pregnancy: Secondary | ICD-10-CM | POA: Diagnosis not present

## 2023-09-23 ENCOUNTER — Ambulatory Visit (HOSPITAL_COMMUNITY): Payer: Medicaid Other

## 2023-09-24 LAB — PANORAMA PRENATAL TEST FULL PANEL:PANORAMA TEST PLUS 5 ADDITIONAL MICRODELETIONS: FETAL FRACTION: 3.7

## 2023-09-26 ENCOUNTER — Telehealth: Payer: Self-pay

## 2023-09-27 ENCOUNTER — Encounter: Payer: Self-pay | Admitting: Student

## 2023-10-04 ENCOUNTER — Encounter (HOSPITAL_COMMUNITY): Payer: Self-pay | Admitting: *Deleted

## 2023-10-04 ENCOUNTER — Ambulatory Visit (HOSPITAL_COMMUNITY)
Admission: EM | Admit: 2023-10-04 | Discharge: 2023-10-04 | Disposition: A | Discharge status: Home / Self Care | Payer: Medicaid Other | Attending: Family Medicine | Admitting: Family Medicine

## 2023-10-04 ENCOUNTER — Other Ambulatory Visit: Payer: Self-pay

## 2023-10-04 DIAGNOSIS — M545 Low back pain, unspecified: Secondary | ICD-10-CM

## 2023-10-04 DIAGNOSIS — M542 Cervicalgia: Secondary | ICD-10-CM

## 2023-10-04 DIAGNOSIS — M549 Dorsalgia, unspecified: Secondary | ICD-10-CM | POA: Diagnosis not present

## 2023-10-04 MED ORDER — METHYLPREDNISOLONE 4 MG PO TBPK: ORAL_TABLET | ORAL | 0 refills | Status: DC

## 2023-10-04 NOTE — ED Triage Notes (Signed)
PT reports back pain for a few days . Pain is located in entire back up to neck. Pt denies any injury.

## 2023-10-04 NOTE — ED Provider Notes (Signed)
[facility]    CSN: [id number] Arrival date & time: 10/04/23  1008      History   Chief Complaint Chief Complaint  Patient presents with   Back Pain    HPI Rhonda Dixon is a 23 y.o. female.    Back Pain Here for upper and lower back pain.  Is been bothering her again in the last few days.  It had bothered her some in December and then got better  Tylenol does help the pain but the symptoms come right back after the effect wears off No trauma or fall.  No fever or dysuria or hematuria.  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   Pregnancy 11/30/2022   Cold sore 10/11/2022   History of abnormal cervical Pap smear 04/28/2022   Encounter for counseling regarding contraception 07/23/2021   Generalized anxiety disorder 10/23/2018   Vaginal discharge 07/12/2017   ALLERGIC RHINITIS, SEASONAL 04/01/2009    Past Surgical History:  Procedure Laterality Date   INDUCED ABORTION  12/2022   NO PAST SURGERIES      OB History     Gravida  3   Para  1   Term  1   Preterm      AB  1   Living  1      SAB      IAB      Ectopic      Multiple  0   Live Births  1            Home Medications    Prior to Admission medications   Medication Sig Start Date End Date Taking? Authorizing Provider  methylPREDNISolone (MEDROL DOSEPAK) 4 MG TBPK tablet Take as per package instructions 10/04/23  Yes Zenia Resides, MD  aspirin EC 81 MG tablet Take 1 tablet (81 mg total) by mouth daily. Swallow whole. 09/15/23   Levin Erp, MD  fluticasone (FLONASE) 50 MCG/ACT nasal spray Place 2 sprays into both nostrils daily. 09/15/23   Levin Erp, MD  lidocaine (HM LIDOCAINE PATCH) 4 %  Place 1 patch onto the skin daily. 09/15/23   Levin Erp, MD  magnesium oxide (MAG-OX) 400 MG tablet Take 1 tablet (400 mg total) by mouth daily. 09/15/23   Levin Erp, MD  escitalopram (LEXAPRO) 10 MG tablet Take 0.5 tablets (5 mg total) by mouth daily. 10/23/18 01/17/20  Lennox Solders, MD  FLUoxetine (PROZAC) 20 MG tablet Take 1 tablet (20 mg total) by mouth daily. 12/18/19 01/17/20  Mirian Mo, MD  ipratropium (ATROVENT) 0.06 % nasal spray Place 2 sprays into both nostrils 4 (four) times daily. 07/26/18 01/17/20  Belinda Fisher, PA-C  omeprazole (PRILOSEC) 20 MG capsule Take 1 capsule (20 mg total) by mouth daily. 08/29/18 06/26/19  Tillman Sers, DO    Family History Family History  Problem Relation Age of Onset   Asthma Mother    Lupus Mother    Autoimmune disease Mother     Social History Social History   Tobacco Use   Smoking status: Never   Smokeless tobacco: Never  Vaping Use   Vaping status: Former   Substances: Nicotine, Flavoring  Substance Use Topics   Alcohol  use: Not Currently    Comment: occ   Drug use: No     Allergies   Patient has no known allergies.   Review of Systems Review of Systems  Musculoskeletal:  Positive for back pain.     Physical Exam Triage Vital Signs ED Triage Vitals [10/04/23 1118]  Encounter Vitals Group     BP 121/77     Systolic BP Percentile      Diastolic BP Percentile      Pulse Rate 79     Resp 18     Temp 99.2 F (37.3 C)     Temp src      SpO2 100 %     Weight      Height      Head Circumference      Peak Flow      Pain Score      Pain Loc      Pain Education      Exclude from Growth Chart    No data found.  Updated Vital Signs BP 121/77   Pulse 79   Temp 99.2 F (37.3 C)   Resp 18   LMP  (LMP Unknown)   SpO2 100%   Visual Acuity Right Eye Distance:   Left Eye Distance:   Bilateral Distance:    Right Eye Near:   Left Eye Near:    Bilateral Near:     Physical Exam Vitals reviewed.   Constitutional:      General: She is not in acute distress.    Appearance: She is not ill-appearing, toxic-appearing or diaphoretic.  HENT:     Mouth/Throat:     Mouth: Mucous membranes are moist.  Cardiovascular:     Rate and Rhythm: Normal rate and regular rhythm.  Pulmonary:     Effort: Pulmonary effort is normal.     Breath sounds: Normal breath sounds.  Musculoskeletal:     Comments: There is mild tenderness of the paraspinous musculature in the upper and lower back.  No rash  Skin:    Coloration: Skin is not pale.  Neurological:     General: No focal deficit present.     Mental Status: She is alert and oriented to person, place, and time.  Psychiatric:        Behavior: Behavior normal.      UC Treatments / Results  Labs (all labs ordered are listed, but only abnormal results are displayed) Labs Reviewed - No data to display  EKG   Radiology No results found.  Procedures Procedures (including critical care time)  Medications Ordered in UC Medications - No data to display  Initial Impression / Assessment and Plan / UC Course  I have reviewed the triage vital signs and the nursing notes.  Pertinent labs & imaging results that were available during my care of the patient were reviewed by me and considered in my medical decision making (see chart for details).     She will continue the Tylenol as needed.  Medrol Dosepak is sent in, after reviewing with her that this should be safe at this point in her pregnancy.  She will follow-up with her primary care or OB/GYN about this issue.  Stretching exercises are printed for her and her discharge summary Final Clinical Impressions(s) / UC Diagnoses   Final diagnoses:  Neck pain  Upper back pain  Acute bilateral low back pain without sciatica     Discharge Instructions      Take the Medrol Dosepak  as instructed in the package.  Please follow-up with your primary care or your OB/GYN about this issue  I  have printed you stretching exercises for your neck, upper back, and low back, contained in this packet     ED Prescriptions     Medication Sig Dispense Auth. Provider   methylPREDNISolone (MEDROL DOSEPAK) 4 MG TBPK tablet Take as per package instructions 1 each Marlinda Mike, Janace Aris, MD      PDMP not reviewed this encounter.   Zenia Resides, MD 10/04/23 [phone]

## 2023-10-04 NOTE — ED Notes (Signed)
Reviewed work note 

## 2023-10-04 NOTE — ED Triage Notes (Signed)
PT 4 months pregnant

## 2023-10-04 NOTE — Discharge Instructions (Signed)
Take the Medrol Dosepak as instructed in the package.  Please follow-up with your primary care or your OB/GYN about this issue  I have printed you stretching exercises for your neck, upper back, and low back, contained in this packet

## 2023-10-06 ENCOUNTER — Telehealth: Payer: Self-pay

## 2023-10-06 NOTE — Telephone Encounter (Signed)
Reached out to mom via telephone call to complete four week follow up call. Unable to reach mom - automated message says phone is out of service. Also tried sending text message with additional information for client but it did not go through.   Chales Pelissier Ladona Ridgel Community Navigator [facility] [employer] [place] Direct Dial: [phone]

## 2023-10-06 NOTE — Telephone Encounter (Signed)
Reached out to mom via telephone call to complete four week follow up call. Unable to reach mom - automated message says phone is out of service  Symir Mah [employer] Navigator [facility] [employer] [place] Direct Dial: [phone]

## 2023-10-11 ENCOUNTER — Telehealth: Payer: Self-pay

## 2023-10-11 NOTE — Telephone Encounter (Signed)
Reached out to mom via email to try and get updated contact information. Phone number on file is not accepting calls/ text messages not going through. Unable to complete four week follow up call due to not being able to contact client.   Caleesi Kohl Ladona Ridgel Community Navigator [facility] [employer] [place] Direct Dial: [phone]

## 2023-10-14 ENCOUNTER — Telehealth: Payer: Self-pay

## 2023-10-14 NOTE — Telephone Encounter (Signed)
Received VM on nurse line from patient requesting order for ultrasound.   Unsure what exactly is needed. Attempted to call patient back at provided number [phone]), she did not answer. LVM asking her to call back to the office.   Veronda Prude, RN

## 2023-10-19 ENCOUNTER — Telehealth: Payer: Self-pay

## 2023-10-19 ENCOUNTER — Other Ambulatory Visit: Payer: Self-pay | Admitting: Family Medicine

## 2023-10-19 ENCOUNTER — Ambulatory Visit: Payer: Medicaid Other | Admitting: Student

## 2023-10-19 VITALS — BP 105/73 | HR 97 | Wt 211.0 lb

## 2023-10-19 DIAGNOSIS — Z3A19 19 weeks gestation of pregnancy: Secondary | ICD-10-CM

## 2023-10-19 DIAGNOSIS — Z23 Encounter for immunization: Secondary | ICD-10-CM

## 2023-10-19 NOTE — Telephone Encounter (Signed)
 Scheduled patient appointment on 02/25 @10 

## 2023-10-19 NOTE — Patient Instructions (Signed)
 Please make a follow up appointment in 4 weeks. Scheduled you your next appointment with OB Faculty here  Prenatal Classes Go to [email or web address] for more information on the pregnancy and child birth classes that [place] has to offer.   Pregnancy Related Return Precautions The follow are signs/symptoms that are abnormal in pregnancy and may require further evaluation by a physician: Go to the MAU at [facility] at [facility] if: You have cramping/contractions that do not go away with drinking water, especially if they are lasting 30 seconds to 1.5 minutes, coming and going every 5-10 minutes for an hour or more, or are getting stronger and you cannot walk or talk while having a contraction/cramp. Your water breaks.  Sometimes it is a big gush of fluid, sometimes it is just a trickle that keeps getting your underwear wet or running down your legs You have vaginal bleeding.    You have a persistent headache that does not go away with 1 g of Tylenol , vision changes, chest pain, difficulty breathing, severe pain in your right upper abdomen, worsening leg swelling- these can all be signs of high blood pressure in pregnancy and need to be evaluated by a provider immediately  These are all concerning in pregnancy and if you have any of these I recommend you call your PCP and present to the Maternity Admissions Unit (map below) for further evaluation.  For any pregnancy-related emergencies, please go to the Maternity Admissions Unit in the Women's & Children's Center at [facility]. You will use hospital Entrance C.    Our clinic number is [phone].

## 2023-10-19 NOTE — Addendum Note (Signed)
 Addended by: Taiesha Bovard on: 10/19/2023 12:25 PM   Modules accepted: Orders

## 2023-10-19 NOTE — Progress Notes (Signed)
  Patient Name: MA MUNOZ Date of Birth: [birth year] [facility] Prenatal Visit  SHIORI ADCOX is a 23 y.o. G3P1011 at [redacted]w[redacted]d here for routine follow up. She is dated by early ultrasound.  She reports no complaints.  She denies vaginal bleeding.  See flow sheet for details.  Vitals:   10/19/23 1019  BP: 105/73  Pulse: 97    A/P: Pregnancy at [redacted]w[redacted]d.  Doing well.    Routine Prenatal Care:  Dating reviewed, dating tab is correct Fetal heart tones Appropriate  155 Influenza vaccine administered today.   COVID vaccination was not discussed The patient has the following indication for screening preexisting diabetes: 1 hour glucose 98 Anatomy ultrasound ordered to be scheduled at 18-20 weeks. Patient already had genetic screening (panorama). (LR boy) Pregnancy education including expected weight gain in pregnancy, OTC medication use, continued use of prenatal vitamin, smoking cessation if applicable, and nutrition in pregnancy.   Bleeding and pain precautions reviewed. The patient has the following indications for aspirinto begin 81 mg at 12-16 weeks: One high risk condition: no single high risk condition  MORE than one moderate risk condition: obesity and identifies as African American  Aspirin  was  recommended today based upon above risk factors (one high risk condition or more than one moderate risk factor) - patient not taking, counseled  2. 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   Follow up 4 weeks at [facility] faculty visit

## 2023-10-19 NOTE — Telephone Encounter (Signed)
-----   Message from [facility] Genevia Kern S sent at 10/19/2023 12:11 PM EST ----- Ok to schedule US  at [facility], Maternal Fetal Care.  Rosanna Comment

## 2023-10-20 ENCOUNTER — Telehealth: Payer: Self-pay

## 2023-10-20 NOTE — Telephone Encounter (Signed)
 Contacted the patient to let her know when her US  appointment is but she did not answer a guy did. I informed him to have her contact the doctors office back when she can.   When she do call back please let her know that I have scheduled her ultrasound appointment and its going to be 02/25 @10  am at the women's center located at [address] 2nd floor.

## 2023-10-20 NOTE — Telephone Encounter (Signed)
 Patient returns call to nurse line.  Date, time and location discussed with patient.   All questions answered.

## 2023-10-26 ENCOUNTER — Telehealth: Payer: Self-pay

## 2023-11-01 ENCOUNTER — Telehealth: Payer: Self-pay

## 2023-11-03 DIAGNOSIS — O99210 Obesity complicating pregnancy, unspecified trimester: Secondary | ICD-10-CM | POA: Insufficient documentation

## 2023-11-08 ENCOUNTER — Ambulatory Visit (HOSPITAL_BASED_OUTPATIENT_CLINIC_OR_DEPARTMENT_OTHER): Payer: Medicaid Other

## 2023-11-08 ENCOUNTER — Ambulatory Visit: Payer: Medicaid Other | Attending: Obstetrics and Gynecology | Admitting: *Deleted

## 2023-11-08 ENCOUNTER — Other Ambulatory Visit: Payer: Self-pay

## 2023-11-08 ENCOUNTER — Other Ambulatory Visit: Payer: Self-pay | Admitting: *Deleted

## 2023-11-08 ENCOUNTER — Ambulatory Visit: Payer: Medicaid Other

## 2023-11-08 VITALS — BP 114/67 | HR 99

## 2023-11-08 DIAGNOSIS — O99212 Obesity complicating pregnancy, second trimester: Secondary | ICD-10-CM

## 2023-11-08 DIAGNOSIS — Z3A22 22 weeks gestation of pregnancy: Secondary | ICD-10-CM | POA: Diagnosis not present

## 2023-11-08 DIAGNOSIS — Z363 Encounter for antenatal screening for malformations: Secondary | ICD-10-CM | POA: Diagnosis not present

## 2023-11-08 DIAGNOSIS — E669 Obesity, unspecified: Secondary | ICD-10-CM | POA: Diagnosis not present

## 2023-11-08 DIAGNOSIS — Z3A19 19 weeks gestation of pregnancy: Secondary | ICD-10-CM

## 2023-11-08 DIAGNOSIS — O99210 Obesity complicating pregnancy, unspecified trimester: Secondary | ICD-10-CM

## 2023-11-08 NOTE — Progress Notes (Unsigned)
 Marland Kitchen  mfm

## 2023-11-11 ENCOUNTER — Telehealth: Payer: Self-pay

## 2023-11-11 NOTE — Telephone Encounter (Signed)
 Reached out to mom via phone call to conduct four week follow up call. Unable to complete four week follow up call due to not being able to contact client. Left voicemail notifying her that account would be marked inactive but if add't support is needed to reach out any time.    Community Navigation reaches out to client 8x before marking account inactive. This is the 9th and final attempt resulting in an inactive account for this client.   Tayli Buch Ladona Ridgel Community Navigator [facility] [employer] [place] Direct Dial: [phone]

## 2023-11-11 NOTE — Telephone Encounter (Signed)
 Reached out to mom via email to try and get updated contact information. Phone number on file is not accepting calls/ text messages not going through. Unable to complete four week follow up call due to not being able to contact client.  Community Navigation reaches out to client 8x before marking account inactive. This is attempt #6.   Willene Holian Ladona Ridgel Community Navigator [facility] [employer] [place] Direct Dial: [phone]

## 2023-11-11 NOTE — Telephone Encounter (Signed)
 Reached out to mom via text message to schedule a time to complete four week follow up call. No response from client; therefore, unable to schedule call.    Community Navigation reaches out to client 8x before marking account inactive. This is attempt #8.   Rhonda Dixon Community Navigator [facility] [employer] [place] Direct Dial: [phone]

## 2023-11-11 NOTE — Telephone Encounter (Signed)
 Reached out to mom via phone call to conduct four week follow up call. Unable to complete four week follow up call due to not being able to contact client. Left voicemail requesting call back.   Community Navigation reaches out to client 8x before marking account inactive. This is attempt #7.   Tommie Bohlken Ladona Ridgel Community Navigator [facility] [employer] [place] Direct Dial: [phone]

## 2023-11-17 ENCOUNTER — Encounter: Payer: Self-pay | Admitting: Student

## 2023-11-17 ENCOUNTER — Telehealth: Payer: Self-pay

## 2023-11-17 NOTE — Telephone Encounter (Signed)
 Patient calls nurse line requesting a letter for [employer].   She reports she has an apt on 3/10 for normal cleaning with xrays.   She reports since she is pregnant they need a letter stating imaging is "fine" from [facility] provider.  Advised will forward to PCP.   She can access letter in [place].

## 2023-12-09 ENCOUNTER — Ambulatory Visit (INDEPENDENT_AMBULATORY_CARE_PROVIDER_SITE_OTHER): Admitting: Family Medicine

## 2023-12-09 VITALS — BP 111/73 | HR 105 | Wt 217.0 lb

## 2023-12-09 DIAGNOSIS — Z3480 Encounter for supervision of other normal pregnancy, unspecified trimester: Secondary | ICD-10-CM | POA: Diagnosis not present

## 2023-12-09 DIAGNOSIS — Z3A14 14 weeks gestation of pregnancy: Secondary | ICD-10-CM | POA: Diagnosis not present

## 2023-12-09 DIAGNOSIS — Z3A26 26 weeks gestation of pregnancy: Secondary | ICD-10-CM

## 2023-12-09 MED ORDER — ASPIRIN 81 MG PO TBEC: 81.0000 mg | DELAYED_RELEASE_TABLET | Freq: Every day | ORAL | 1 refills | Status: DC

## 2023-12-09 NOTE — Patient Instructions (Signed)
   Go to the MAU at [facility] at [facility] if: You have cramping/contractions that do not go away with drinking water Your water breaks.  Sometimes it is a big gush of fluid, sometimes it is just a trickle that keeps getting your underwear wet or running down your legs You have vaginal bleeding.    You do not feel your baby moving like normal.  If you do not, get something to eat and drink and lay down and focus on feeling your baby move. If your baby is still not moving like normal, you should go to MAU.

## 2023-12-09 NOTE — Progress Notes (Signed)
 [facility] Faculty OB Clinic Visit  Rhonda Dixon is a 23 y.o. G3P1011 at [redacted]w[redacted]d (via 6w u/s) who presents to [facility] Faculty OB Clinic for routine follow up. Prenatal course, history, notes, ultrasounds, and laboratory results reviewed.  Denies cramping/ctx, fluid leaking, vaginal bleeding, or decreased fetal movement. Taking PNV.    Primary Prenatal Care Provider: Dr. Laroy Apple  Postpartum Plans: - delivery planning: desires SVD with epidural - circumcision: yes, inpatient - feeding: breast - pediatrician: [facility] - contraception: undecided, aware of options  BP 111/73 FHR: 145bpm Uterine size: 26  Assessment & Plan  1. Routine prenatal care: - rx sent in for baby aspirin - visits through end of may scheduled  2. 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 Elevated BMI - growth scan scheduled 5/6 per MFM  Next prenatal visit in 2 weeks. Needs 1hr GTT, CBC, HIV, RPR, and Tdap at that visit. Labor & fetal movement precautions discussed.  Levert Feinstein, MD, [facility]  Family Medicine Faculty

## 2023-12-23 ENCOUNTER — Encounter: Payer: Self-pay | Admitting: Student

## 2023-12-23 ENCOUNTER — Ambulatory Visit: Payer: Self-pay | Admitting: Student

## 2023-12-23 VITALS — BP 106/71 | HR 94 | Wt 217.2 lb

## 2023-12-23 DIAGNOSIS — Z23 Encounter for immunization: Secondary | ICD-10-CM

## 2023-12-23 DIAGNOSIS — Z3480 Encounter for supervision of other normal pregnancy, unspecified trimester: Secondary | ICD-10-CM

## 2023-12-23 DIAGNOSIS — K219 Gastro-esophageal reflux disease without esophagitis: Secondary | ICD-10-CM | POA: Diagnosis not present

## 2023-12-23 LAB — POCT 1 HR PRENATAL GLUCOSE: Glucose 1 Hr Prenatal, POC: 87 mg/dL

## 2023-12-23 MED ORDER — PRENATAL MULTIVITAMIN CH: 1.0000 | ORAL_TABLET | Freq: Every day | ORAL | 1 refills | Status: DC

## 2023-12-23 MED ORDER — FAMOTIDINE 20 MG PO TABS
20.0000 mg | ORAL_TABLET | Freq: Every day | ORAL | 1 refills | Status: DC
Start: 2023-12-23 — End: 2024-02-16

## 2023-12-23 NOTE — Patient Instructions (Addendum)
 Please make a follow up appointment in 2 weeks.  Prenatal Classes Go to [email or web address] for more information on the pregnancy and child birth classes that [place] has to offer.   Pregnancy Related Return Precautions The follow are signs/symptoms that are abnormal in pregnancy and may require further evaluation by a physician: Go to the MAU at [facility] at [facility] if: You have cramping/contractions that do not go away with drinking water, especially if they are lasting 30 seconds to 1.5 minutes, coming and going every 5-10 minutes for an hour or more, or are getting stronger and you cannot walk or talk while having a contraction/cramp. Your water breaks.  Sometimes it is a big gush of fluid, sometimes it is just a trickle that keeps getting your underwear wet or running down your legs You have vaginal bleeding.    You do not feel your baby moving like normal.  If you do not, get something to eat and drink (something cold or something with sugar like peanut butter or juice) and lay down and focus on feeling your baby move. If your baby is still not moving like normal, you should go to MAU. You should feel your baby move 6 times in one hour, or 10 times in two hours. You have a persistent headache that does not go away with 1 g of Tylenol, vision changes, chest pain, difficulty breathing, severe pain in your right upper abdomen, worsening leg swelling- these can all be signs of high blood pressure in pregnancy and need to be evaluated by a provider immediately  These are all concerning in pregnancy and if you have any of these I recommend you call your PCP and present to the Maternity Admissions Unit (map below) for further evaluation.  For any pregnancy-related emergencies, please go to the Maternity Admissions Unit in the Women's & Children's Center at [facility]. You will use hospital Entrance C.    Our clinic number is [phone].

## 2023-12-23 NOTE — Progress Notes (Addendum)
  [facility] Prenatal Visit  Rhonda Dixon is a 23 y.o. G3P1011 at [redacted]w[redacted]d here for routine follow up. She is dated by early ultrasound.  She reports more reflux symptoms, She reports fetal movement. She denies vaginal bleeding, contractions, or loss of fluid. See flow sheet for details.  Vitals:   12/23/23 1450  BP: 106/71  Pulse: 94     A/P: Pregnancy at [redacted]w[redacted]d.  Doing well.   Routine prenatal care:  Dating reviewed, dating tab is correct Fetal heart tones Appropriate Fundal height within expected range.  Infant feeding choice: Breastfeeding Contraception choice: Undecided  Infant circumcision desired yes  The patient does not have a history of Cesarean delivery and no referral to [facility] is indicated Influenza vaccine previously administered.   Tdap was given today. 1 hour glucola, CBC, RPR, and HIV were obtained today.    Rh status was reviewed and patient does not need Rhogam.  Rhogam was not given today.  Pregnancy medical home and PHQ-9 forms were done today and reviewed.      12/23/2023    2:50 PM 10/19/2023   10:21 AM 08/02/2023   10:00 AM 07/12/2023    2:39 PM 11/30/2022    1:30 PM  Depression screen PHQ 2/9  Decreased Interest 1 1 2  0 1  Down, Depressed, Hopeless 2 2 2 1 2   PHQ - 2 Score 3 3 4 1 3   Altered sleeping 3 3 2 3 3   Tired, decreased energy 3 3 2 3 3   Change in appetite 2 2 1 2 1   Feeling bad or failure about yourself  1 1 1 2 2   Trouble concentrating 2 1 1 1 1   Moving slowly or fidgety/restless 1 0 0 0 1  Suicidal thoughts 0 0 0 0 0  PHQ-9 Score 15 13 11 12 14   Difficult doing work/chores  Somewhat difficult Very difficult       Childbirth and education classes were offered. Pregnancy education regarding benefits of breastfeeding, contraception, fetal growth, expected weight gain, and safe infant sleep were discussed.  Preterm labor and fetal movement precautions reviewed.  2. Pregnancy issues include the  following and were addressed as appropriate today:  Elevated PHQ9-similar to previous values, will discuss further in 2 weeks  Tdap given Labs CBC, RPR, HIV colleted Reflux-start pepcid 20 mg daily, symptomatic measures discussed  Problem list and pregnancy box updated: Yes.   Follow up 2 weeks.

## 2023-12-24 LAB — CBC
Hematocrit: 36 % (ref 34.0–46.6)
Hemoglobin: 11.9 g/dL (ref 11.1–15.9)
MCH: 29.8 pg (ref 26.6–33.0)
MCHC: 33.1 g/dL (ref 31.5–35.7)
MCV: 90 fL (ref 79–97)
Platelets: 354 10*3/uL (ref 150–450)
RBC: 3.99 x10E6/uL (ref 3.77–5.28)
RDW: 13 % (ref 11.7–15.4)
WBC: 8.8 10*3/uL (ref 3.4–10.8)

## 2023-12-24 LAB — RPR: RPR Ser Ql: NONREACTIVE

## 2023-12-24 LAB — HIV ANTIBODY (ROUTINE TESTING W REFLEX): HIV Screen 4th Generation wRfx: NONREACTIVE

## 2024-01-03 ENCOUNTER — Ambulatory Visit: Payer: Self-pay | Admitting: Student

## 2024-01-03 VITALS — BP 95/72 | HR 95 | Temp 98.2°F | Wt 214.4 lb

## 2024-01-03 DIAGNOSIS — Z3A30 30 weeks gestation of pregnancy: Secondary | ICD-10-CM | POA: Diagnosis not present

## 2024-01-03 DIAGNOSIS — Z3480 Encounter for supervision of other normal pregnancy, unspecified trimester: Secondary | ICD-10-CM

## 2024-01-03 DIAGNOSIS — J069 Acute upper respiratory infection, unspecified: Secondary | ICD-10-CM

## 2024-01-03 MED ORDER — FLUTICASONE PROPIONATE 50 MCG/ACT NA SUSP: 2.0000 | Freq: Every day | NASAL | 6 refills | Status: AC

## 2024-01-03 NOTE — Progress Notes (Signed)
  [facility] Prenatal Visit  Rhonda Dixon is a 23 y.o. G3P1011 at [redacted]w[redacted]d here for routine follow up. She is dated by early ultrasound.  She reports no complaints. She reports fetal movement. She denies vaginal bleeding, contractions, or loss of fluid. See flow sheet for details.  Vitals:   01/03/24 1336  BP: 95/72  Pulse: 95   Discussed the use of AI scribe software for clinical note transcription with the patient, who gave verbal consent to proceed.  History of Present Illness Sore throat and congestion that started abruptly after Easter. She describes the throat pain as worsening with swallowing. She has been managing the symptoms with herbal teas from [place]. She denies fever and lymphadenopathy. She also reports recent exposure to a large group of people at a family gathering.  In addition, the patient reports 1 episode vomiting, which started around the same time as the sore throat and congestion. She denies diarrhea. She also mentions that she has not yet started taking the prescribed Pepcid  for heartburn.  General: Well appearing, NAD, awake, alert, responsive to questions Head: Normocephalic atraumatic, no oropharyngeal exudates, no lymphadenopathy, nasal turbinates swollen CV: Regular rate and rhythm no murmurs rubs or gallops Respiratory: Clear to ausculation bilaterally, no wheezes rales or crackles, chest rises symmetrically,  no increased work of breathing Abdomen: gravid, non-tender  A/P: Pregnancy at [redacted]w[redacted]d.  Doing well.   Routine prenatal care:  Dating reviewed, dating tab is correct Fetal heart tones Appropriate Fundal height within expected range.  Infant feeding choice: Breastfeeding Contraception choice: Undecided  Infant circumcision desired yes  The patient does not have a history of Cesarean delivery and no referral to Center for [facility] is indicated Influenza vaccine previously administered.   Tdap was given 4/11. 1 hour glucola,  CBC, RPR, and HIV were obtained previously, normal.    Rh status was reviewed and patient does not need Rhogam.  Rhogam was not given today.  Childbirth and education classes were offered. Pregnancy education regarding benefits of breastfeeding, contraception, fetal growth, expected weight gain, and safe infant sleep were discussed.  Preterm labor and fetal movement precautions reviewed.  2. Pregnancy issues include the following and were addressed as appropriate today:   Heartburn-patient to pick up pepcid  Acute illness-likely URI, well appearing on exam, work note provided, symptomatic measures and flonase  sent  Problem list and pregnancy box updated: Yes.   Patient scheduled in [facility] during third trimester on 6/5.  Follow up 2 weeks.

## 2024-01-03 NOTE — Patient Instructions (Addendum)
 Please make a follow up appointment in 2 weeks.  It looks like you have a viral illness, this should get better with time, please return if not improving in the next week or worsening  Safe meds for Colds/Coughs/Allergies:  Benadryl  (alcohol free) 25 mg every 6 hours as needed  Breath right strips  Claritin   Cepacol throat lozenges  Chloraseptic throat spray  Cold-Eeze- up to three times per day  Cough drops, alcohol free  Flonase  (by prescription only)  Guaifenesin  Mucinex  Robitussin DM (plain only, alcohol free)  Saline nasal spray/drops  Sudafed (pseudoephedrine ) & Actifed * use only after [redacted] weeks gestation and if you do not have high blood pressure  Tylenol   Vicks Vaporub  Zinc lozenges  Zyrtec    Prenatal Classes Go to [email or web address] for more information on the pregnancy and child birth classes that [facility] has to offer.   Pregnancy Related Return Precautions The follow are signs/symptoms that are abnormal in pregnancy and may require further evaluation by a physician: Go to the MAU at [facility] at [facility] if: You have cramping/contractions that do not go away with drinking water, especially if they are lasting 30 seconds to 1.5 minutes, coming and going every 5-10 minutes for an hour or more, or are getting stronger and you cannot walk or talk while having a contraction/cramp. Your water breaks.  Sometimes it is a big gush of fluid, sometimes it is just a trickle that keeps getting your underwear wet or running down your legs You have vaginal bleeding.    You do not feel your baby moving like normal.  If you do not, get something to eat and drink (something cold or something with sugar like peanut butter or juice) and lay down and focus on feeling your baby move. If your baby is still not moving like normal, you should go to MAU. You should feel your baby move 6 times in one hour, or 10 times in two hours. You  have a persistent headache that does not go away with 1 g of Tylenol , vision changes, chest pain, difficulty breathing, severe pain in your right upper abdomen, worsening leg swelling- these can all be signs of high blood pressure in pregnancy and need to be evaluated by a provider immediately  These are all concerning in pregnancy and if you have any of these I recommend you call your PCP and present to the Maternity Admissions Unit (map below) for further evaluation.  For any pregnancy-related emergencies, please go to the Maternity Admissions Unit in the Women's & Children's Center at [facility]. You will use hospital Entrance C.    Our clinic number is [phone].

## 2024-01-17 ENCOUNTER — Ambulatory Visit: Payer: Self-pay | Admitting: Student

## 2024-01-17 ENCOUNTER — Ambulatory Visit: Payer: Medicaid Other | Attending: Obstetrics and Gynecology

## 2024-01-17 DIAGNOSIS — Z3480 Encounter for supervision of other normal pregnancy, unspecified trimester: Secondary | ICD-10-CM

## 2024-01-17 DIAGNOSIS — O99210 Obesity complicating pregnancy, unspecified trimester: Secondary | ICD-10-CM | POA: Diagnosis present

## 2024-01-17 DIAGNOSIS — E669 Obesity, unspecified: Secondary | ICD-10-CM | POA: Diagnosis not present

## 2024-01-17 DIAGNOSIS — O99213 Obesity complicating pregnancy, third trimester: Secondary | ICD-10-CM

## 2024-01-17 DIAGNOSIS — Z3A32 32 weeks gestation of pregnancy: Secondary | ICD-10-CM

## 2024-01-17 MED ORDER — PRENATAL 27-1 MG PO TABS: 1.0000 | ORAL_TABLET | Freq: Every day | ORAL | 0 refills | Status: DC

## 2024-01-17 NOTE — Patient Instructions (Addendum)
 Please make a follow up appointment in 2 weeks.  Prenatal Classes Go to [email or web address] for more information on the pregnancy and child birth classes that [place] has to offer.   Pregnancy Related Return Precautions The follow are signs/symptoms that are abnormal in pregnancy and may require further evaluation by a physician: Go to the MAU at [facility] at [facility] if: You have cramping/contractions that do not go away with drinking water, especially if they are lasting 30 seconds to 1.5 minutes, coming and going every 5-10 minutes for an hour or more, or are getting stronger and you cannot walk or talk while having a contraction/cramp. Your water breaks.  Sometimes it is a big gush of fluid, sometimes it is just a trickle that keeps getting your underwear wet or running down your legs You have vaginal bleeding.    You do not feel your baby moving like normal.  If you do not, get something to eat and drink (something cold or something with sugar like peanut butter or juice) and lay down and focus on feeling your baby move. If your baby is still not moving like normal, you should go to MAU. You should feel your baby move 6 times in one hour, or 10 times in two hours. You have a persistent headache that does not go away with 1 g of Tylenol, vision changes, chest pain, difficulty breathing, severe pain in your right upper abdomen, worsening leg swelling- these can all be signs of high blood pressure in pregnancy and need to be evaluated by a provider immediately  These are all concerning in pregnancy and if you have any of these I recommend you call your PCP and present to the Maternity Admissions Unit (map below) for further evaluation.  For any pregnancy-related emergencies, please go to the Maternity Admissions Unit in the Women's & Children's Center at [facility]. You will use hospital Entrance C.    Our clinic number is [phone].

## 2024-01-17 NOTE — Progress Notes (Signed)
  [facility] Prenatal Visit  Rhonda Dixon is a 23 y.o. G3P1011 at [redacted]w[redacted]d here for routine follow up. She is dated by early ultrasound.  She reports no complaints.  She reports fetal movement. She denies vaginal bleeding, contractions, or loss of fluid.  See flow sheet for details.  Vitals:   01/17/24 1426  BP: 104/68  Pulse: 99   Discussed the use of AI scribe software for clinical note transcription with the patient, who gave verbal consent to proceed.  History of Present Illness Rhonda Dixon is a 23 year old female who presents for a routine prenatal follow-up visit.  She feels generally well with no new issues since the last visit. There is no vaginal bleeding, leaking of fluid, cramping, or contractions. Fetal movement is normal. An ultrasound earlier today indicated normal growth and normal amniotic fluid levels. She has no concerns at this time regarding the pregnancy.  She experiences heartburn, which is effectively managed with medication.   A/P: Pregnancy at [redacted]w[redacted]d.  Doing well.   Routine prenatal care:  Dating reviewed, dating tab is correct Fetal heart tones: Appropriate Fundal height: within expected range.  The patient does not have a history of HSV and valacyclovir  is not indicated at this time.  The patient does not have a history of Cesarean delivery and no referral to Center for [facility] is indicated Infant feeding choice: Breastfeeding Contraception choice: Undecided  Infant circumcision desired yes Influenza vaccine previously administered.   Tdap was given 4/11. Abrysvo RSV vaccine was not discussed today COVID vaccination was not discussed Childbirth and education classes were offered. Pregnancy education regarding benefits of breastfeeding, contraception, fetal growth, expected weight gain, and safe infant sleep were discussed.  Preterm labor and fetal movement precautions reviewed.   2. Pregnancy issues include the following  and were addressed as appropriate today: Reflux-improved on pepcid  Growth u/s today normal, no follow up needed  Problem list and pregnancy box updated: Yes.   Scheduled for Ob Faculty clinic in third trimester on 02/16/2024.   Follow up 2 weeks.

## 2024-01-29 NOTE — Progress Notes (Deleted)
  [facility] Prenatal Visit  Rhonda Dixon is a 23 y.o. G3P1011 at [redacted]w[redacted]d here for routine follow up. She is dated by early ultrasound.  She reports {symptoms; pregnancy related:14538}.  She reports fetal movement. She denies vaginal bleeding, contractions, or loss of fluid.  See flow sheet for details.  There were no vitals filed for this visit.   A/P: Pregnancy at [redacted]w[redacted]d.  Doing well.   Routine prenatal care:  Dating reviewed, dating tab is correct Fetal heart tones: {appropriate:23337} Fundal height: {fundal height:23342::"within expected range. "} The patient does not have a history of HSV and valacyclovir  is not indicated at this time.  The patient does not have a history of Cesarean delivery and no referral to Center for [facility] is indicated Infant feeding choice: Breastfeeding Contraception choice: {postpartum contraception:23348} Infant circumcision desired yes Influenza vaccine previously administered.   Tdap was not given today, previously administered Abrysvo RSV vaccine was not discussed today (Not RSV season) COVID vaccination was discussed and ***.  Childbirth and education classes were offered. Pregnancy education regarding benefits of breastfeeding, contraception, fetal growth, expected weight gain, and safe infant sleep were discussed.  Preterm labor and fetal movement precautions reviewed.   2. Pregnancy issues include the following and were addressed as appropriate today:  Pap abnormal-will need repeat after delivery  Problem list and pregnancy box updated: {yes/no:20286::"Yes"}.   Scheduled for Ob Faculty clinic in third trimester on ***.   Follow up 2 weeks.

## 2024-01-30 ENCOUNTER — Encounter: Payer: Self-pay | Admitting: Student

## 2024-02-16 ENCOUNTER — Ambulatory Visit: Admitting: Family Medicine

## 2024-02-16 ENCOUNTER — Inpatient Hospital Stay (HOSPITAL_COMMUNITY)
Admission: AD | Admit: 2024-02-16 | Discharge: 2024-02-16 | Disposition: A | Discharge status: Home / Self Care | Attending: Obstetrics and Gynecology | Admitting: Obstetrics and Gynecology

## 2024-02-16 ENCOUNTER — Other Ambulatory Visit: Payer: Self-pay | Admitting: Family Medicine

## 2024-02-16 ENCOUNTER — Other Ambulatory Visit (HOSPITAL_COMMUNITY)
Admission: RE | Admit: 2024-02-16 | Discharge: 2024-02-16 | Disposition: A | Discharge status: Home / Self Care | Source: Ambulatory Visit | Attending: Family Medicine | Admitting: Family Medicine

## 2024-02-16 VITALS — BP 109/83 | HR 109 | Wt 216.6 lb

## 2024-02-16 DIAGNOSIS — Z3A36 36 weeks gestation of pregnancy: Secondary | ICD-10-CM | POA: Diagnosis not present

## 2024-02-16 DIAGNOSIS — O99283 Endocrine, nutritional and metabolic diseases complicating pregnancy, third trimester: Secondary | ICD-10-CM

## 2024-02-16 DIAGNOSIS — O42913 Preterm premature rupture of membranes, unspecified as to length of time between rupture and onset of labor, third trimester: Secondary | ICD-10-CM | POA: Diagnosis present

## 2024-02-16 DIAGNOSIS — K219 Gastro-esophageal reflux disease without esophagitis: Secondary | ICD-10-CM | POA: Diagnosis not present

## 2024-02-16 DIAGNOSIS — Z3493 Encounter for supervision of normal pregnancy, unspecified, third trimester: Secondary | ICD-10-CM | POA: Diagnosis not present

## 2024-02-16 DIAGNOSIS — Z3480 Encounter for supervision of other normal pregnancy, unspecified trimester: Secondary | ICD-10-CM | POA: Diagnosis not present

## 2024-02-16 DIAGNOSIS — Z3483 Encounter for supervision of other normal pregnancy, third trimester: Secondary | ICD-10-CM | POA: Diagnosis not present

## 2024-02-16 DIAGNOSIS — Z0371 Encounter for suspected problem with amniotic cavity and membrane ruled out: Secondary | ICD-10-CM | POA: Diagnosis not present

## 2024-02-16 LAB — POCT WET PREP (WET MOUNT)
Clue Cells Wet Prep Whiff POC: POSITIVE
Trichomonas Wet Prep HPF POC: ABSENT
WBC, Wet Prep HPF POC: >20

## 2024-02-16 LAB — POCT FERNING: Ferning, POC: NEGATIVE

## 2024-02-16 LAB — RUPTURE OF MEMBRANE (ROM)PLUS: Rom Plus: NEGATIVE

## 2024-02-16 MED ORDER — METRONIDAZOLE 500 MG PO TABS: 500.0000 mg | ORAL_TABLET | Freq: Two times a day (BID) | ORAL | 0 refills | Status: DC

## 2024-02-16 MED ORDER — PRENATAL 27-1 MG PO TABS: 1.0000 | ORAL_TABLET | Freq: Every day | ORAL | 0 refills | Status: AC

## 2024-02-16 MED ORDER — FAMOTIDINE 20 MG PO TABS: 20.0000 mg | ORAL_TABLET | Freq: Every day | ORAL | 1 refills | Status: AC

## 2024-02-16 NOTE — Progress Notes (Unsigned)
 [place]

## 2024-02-16 NOTE — MAU Note (Signed)
 Pt sent from office for Leaking fluid for a few days. Fern negative there and Vag cultures sent. Denies any abd pain or cramping and reports good fetal movement.2

## 2024-02-16 NOTE — Progress Notes (Signed)
  [facility] Prenatal Visit  Rhonda Dixon is a 23 y.o. G3P1011 at [redacted]w[redacted]d here for routine follow up. She is dated by early ultrasound.  She reports back and abdominal soreness x 2 weeks, leaking of white/clear fluid for a few days, and malodor with clumpy discharge for weeks. She reports fetal movement. She denies vaginal bleeding, contractions. See flow sheet for details.  Vitals:   02/16/24 1142  BP: 109/83  Pulse: (!) 109    A/P: Pregnancy at [redacted]w[redacted]d.  Doing well.   Routine prenatal care  Dating reviewed, dating tab is correct Fetal heart tones not assessed, patient sent to MAU  Fundal height not assessed, patient sent to MAU  GBS collected today. .  Repeat GC/CT collected today.  The patient does not have a history of HSV and valacyclovir  is not indicated at this time.  Infant feeding choice: Breastfeeding Contraception choice: Undecided  Infant circumcision desired yes BPP scheduled between 40-41 weeks (aim for 40.1 WGA) on 6/30 Pregnancy education regarding preterm labor, fetal movement,  benefits of breastfeeding, contraception, fetal growth, expected weight gain, and safe infant sleep were discussed.    2. Pregnancy issues include the following and were addressed as appropriate today:   Fluid leakage: Negative fern test in clinic. Sending to MAU for further ROM evaluation.   BV: KOH test positive for BV in clinic. Pending MAU eval, will send Flagyl  as indicated.   Problem list and pregnancy box updated: Yes.  Follow up 1 week.

## 2024-02-16 NOTE — MAU Provider Note (Signed)
 Event Date/Time   First Provider Initiated Contact with Patient 02/16/24 1501       S: Rhonda Dixon is a 23 y.o. G3P1011 at [redacted]w[redacted]d  who presents to MAU today complaining of leaking of fluid x 2 days. She denies vaginal bleeding. She denies contractions. She reports normal fetal movement.   Was seen by OB today. Had pelvic exam without evidence of ROM, but sent here for ROM plus. Wet prep showed BV, flagyl  sent in by her OB.   O: BP 108/70   Pulse 95   Resp 18   LMP 05/30/2023  GENERAL: Well-developed, well-nourished female in no acute distress.  HEAD: Normocephalic, atraumatic.  CHEST: Normal effort of breathing, regular heart rate ABDOMEN: Soft, nontender, gravid PELVIC: done prior to arrival    Fetal Monitoring: Baseline: 135 Variability: moderate Accelerations: 15x15 Decelerations: none Contractions: none  Pt informed that the ultrasound is considered a limited OB ultrasound and is not intended to be a complete ultrasound exam.  Patient also informed that the ultrasound is not being completed with the intent of assessing for fetal or placental anomalies or any pelvic abnormalities.  Explained that the purpose of today's ultrasound is to assess for  presentation and AFI.  Patient acknowledges the purpose of the exam and the limitations of the study.   Cephalic. AFI 15.5, MVP 5.28  Results for orders placed or performed during the hospital encounter of 02/16/24 (from the past 24 hours)  Rupture of Membrane (ROM) Plus     Status: None   Collection Time: 02/16/24  2:11 PM  Result Value Ref Range   Rom Plus NEGATIVE      A: SIUP at [redacted]w[redacted]d  Membranes intact  P: Discharge home Reviewed return precautions Keep f/u with OB Notified of medication sent in my her OB  Terri Fester, NP 02/16/2024 3:01 PM

## 2024-02-16 NOTE — Patient Instructions (Addendum)
 We have scheduled you a follow up appointment in 1 weeks.  We did swabs for group B strep and infections today. Because you are noticing trickles of fluid for the past few days we recommend you go to the MAU so we can check if your water has broken- it can sometimes trickle like this.   WE have put some therapy resources below.  Pregnancy Related Return Precautions The follow are signs/symptoms that are abnormal in pregnancy and may require further evaluation by a physician: Go to the MAU at [facility] at [facility] if: You have cramping/contractions that do not go away with drinking water, especially if they are lasting 30 seconds to 1.5 minutes, coming and going every 5-10 minutes for an hour or more, or are getting stronger and you cannot walk or talk while having a contraction/cramp. Your water breaks.  Sometimes it is a big gush of fluid, sometimes it is just a trickle that keeps getting your underwear wet or running down your legs You have vaginal bleeding.    You do not feel your baby moving like normal.  If you do not, get something to eat and drink (something cold or something with sugar like peanut butter or juice) and lay down and focus on feeling your baby move. If your baby is still not moving like normal, you should go to MAU. You should feel your baby move 6 times in one hour, or 10 times in two hours. You have a persistent headache that does not go away with 1 g of Tylenol , vision changes, chest pain, difficulty breathing, severe pain in your right upper abdomen, worsening leg swelling- these can all be signs of high blood pressure in pregnancy and need to be evaluated by a provider immediately  These are all concerning in pregnancy and if you have any of these I recommend you call your PCP and present to the Maternity Admissions Unit (map below) for further evaluation.  For any pregnancy-related emergencies, please go to the Maternity Admissions Unit in the Women's  & Children's Center at [facility]. You will use hospital Entrance C.     Therapy and Counseling Resources Most providers on this list will take Medicaid. Patients with commercial insurance or Medicare should contact their insurance company to get a list of in network providers.  [facility] (takes children) Location 1: [address] [place], [place] [address] Location 2: [address] [place], [place] [address] [phone]   [facility] (spanish speaking therapist available)(habla espanol)(take medicare and medicaid)  [address], [place], [place] [address], [place]  [email or web address] [phone]  [facility] Counseling [address] [place], [place] [address] [phone]  [facility]   [address] [place], [place] [address]      [phone]  [facility] (spanish available) [address]  [place], [place] [address] [phone]  [facility] (take [facility] and medicare) [address]  [place], [place] [address]       [phone]     [facility] (virtual only) [phone]  [facility] [address], [place], [place] [phone]  [facility]:  [address] [place] [phone]  [facility]:  [address] [place], [place] [phone]  [facility] (under & uninsured) [address]   [place] [phone]    [email or web address] [phone]  [address]  [place]    [phone]  [facility] -[address]     Phone:  [phone]     [facility]-  [address]  [phone]   [facility] [address]      [place], [place] [phone]  [facility]: [address] [place], [place], [place]  [phone]   [facility]  (Spanish therapist) [email or web address]  [address] [place]  [address]   [place] [address]    [phone]    [facility]   [address],  [place], [place]  [phone]   [facility]  [address] [place]  [phone]  [facility]  [address] [place], [place]        [phone]  Open Access/Walk In Clinic under & uninsured  [facility]  [address] [place], [place] [phone] Crisis [phone]  [facility] [address],  (Spanish)   [address] , [place]: [phone]) 8:30 - 12; 1 - 2:30  [facility] [employer],  [address] [place], [place]    [phone]):8:30 - 12; 2 - 3PM  [facility] [employer],  [address]  [place]; [phone]):   Mon - Fri 8 AM - 5 PM  [facility] [address] [place]  MWF 12:30 to 3:00 or call to schedule an appointment  [phone]  Specific Provider options Psychology Today  https://www.psychologytoday.com/us  click on find a therapist  enter your zip code left side and select or tailor a therapist for your specific need.   [facility] Provider Directory [email or web address]  (Medicaid)   Follow all drop down to find a provider  Social Support program [facility] [place] [phone] or PhotoSolver.pl [address], [place], [place] Recovery support and educational   24- Hour Availability:   [facility]  [address] [place], [place] [phone] Crisis [phone]  [facility] [phone]  [facility]  [phone]   [facility]  [phone] (after hours)  Therapeutic Alternative/Mobile Crisis   [phone]  [place]  National Suicide Hotline  ([phone] Derrel Flies)  Call 911 or go to emergency  room  [facility]  [phone]);  [place] and [employer]  [phone]); Reymundo Caulk, [place], [place], [place], [place], [place]  Our clinic number is [phone].   Dr Drue Gerald

## 2024-02-17 LAB — CERVICOVAGINAL ANCILLARY ONLY
Chlamydia: NEGATIVE
Comment: NEGATIVE
Comment: NORMAL
Neisseria Gonorrhea: NEGATIVE

## 2024-02-20 ENCOUNTER — Ambulatory Visit: Payer: Self-pay | Admitting: Family Medicine

## 2024-02-20 LAB — CULTURE, BETA STREP (GROUP B ONLY): Strep Gp B Culture: NEGATIVE

## 2024-02-21 ENCOUNTER — Encounter: Payer: Self-pay | Admitting: *Deleted

## 2024-02-22 ENCOUNTER — Encounter: Payer: Self-pay | Admitting: Student

## 2024-03-06 ENCOUNTER — Other Ambulatory Visit: Payer: Self-pay

## 2024-03-06 ENCOUNTER — Inpatient Hospital Stay (HOSPITAL_COMMUNITY)
Admission: AD | Admit: 2024-03-06 | Discharge: 2024-03-08 | DRG: 807 | Disposition: A | Discharge status: Home / Self Care | Attending: Obstetrics & Gynecology | Admitting: Obstetrics & Gynecology

## 2024-03-06 ENCOUNTER — Inpatient Hospital Stay (HOSPITAL_COMMUNITY): Admitting: Anesthesiology

## 2024-03-06 ENCOUNTER — Encounter (HOSPITAL_COMMUNITY): Payer: Self-pay | Admitting: Obstetrics & Gynecology

## 2024-03-06 DIAGNOSIS — Z8249 Family history of ischemic heart disease and other diseases of the circulatory system: Secondary | ICD-10-CM | POA: Diagnosis not present

## 2024-03-06 DIAGNOSIS — Z833 Family history of diabetes mellitus: Secondary | ICD-10-CM | POA: Diagnosis not present

## 2024-03-06 DIAGNOSIS — O99210 Obesity complicating pregnancy, unspecified trimester: Secondary | ICD-10-CM | POA: Diagnosis present

## 2024-03-06 DIAGNOSIS — O99214 Obesity complicating childbirth: Principal | ICD-10-CM | POA: Diagnosis present

## 2024-03-06 DIAGNOSIS — Z7982 Long term (current) use of aspirin: Secondary | ICD-10-CM | POA: Diagnosis not present

## 2024-03-06 DIAGNOSIS — O9962 Diseases of the digestive system complicating childbirth: Secondary | ICD-10-CM | POA: Diagnosis not present

## 2024-03-06 DIAGNOSIS — Z3A39 39 weeks gestation of pregnancy: Secondary | ICD-10-CM | POA: Diagnosis not present

## 2024-03-06 DIAGNOSIS — K219 Gastro-esophageal reflux disease without esophagitis: Secondary | ICD-10-CM | POA: Diagnosis present

## 2024-03-06 DIAGNOSIS — Z3490 Encounter for supervision of normal pregnancy, unspecified, unspecified trimester: Secondary | ICD-10-CM

## 2024-03-06 DIAGNOSIS — Z8742 Personal history of other diseases of the female genital tract: Secondary | ICD-10-CM

## 2024-03-06 DIAGNOSIS — O26893 Other specified pregnancy related conditions, third trimester: Secondary | ICD-10-CM | POA: Diagnosis not present

## 2024-03-06 LAB — TYPE AND SCREEN
ABO/RH(D): B POS
Antibody Screen: NEGATIVE

## 2024-03-06 LAB — CBC
HCT: 39.7 % (ref 36.0–46.0)
Hemoglobin: 13 g/dL (ref 12.0–15.0)
MCH: 29.3 pg (ref 26.0–34.0)
MCHC: 32.7 g/dL (ref 30.0–36.0)
MCV: 89.4 fL (ref 80.0–100.0)
Platelets: 354 10*3/uL (ref 150–400)
RBC: 4.44 MIL/uL (ref 3.87–5.11)
RDW: 14 % (ref 11.5–15.5)
WBC: 8.3 10*3/uL (ref 4.0–10.5)
nRBC: 0 % (ref 0.0–0.2)

## 2024-03-06 LAB — RPR: RPR Ser Ql: NONREACTIVE

## 2024-03-06 MED ORDER — OXYCODONE-ACETAMINOPHEN 5-325 MG PO TABS: 2.0000 | ORAL_TABLET | ORAL | Status: DC | PRN

## 2024-03-06 MED ORDER — LACTATED RINGERS IV SOLN: 500.0000 mL | INTRAVENOUS | Status: DC | PRN

## 2024-03-06 MED ORDER — ACETAMINOPHEN 325 MG PO TABS: 650.0000 mg | ORAL_TABLET | ORAL | Status: DC | PRN

## 2024-03-06 MED ORDER — OXYCODONE-ACETAMINOPHEN 5-325 MG PO TABS: 1.0000 | ORAL_TABLET | ORAL | Status: DC | PRN

## 2024-03-06 MED ORDER — OXYTOCIN-SODIUM CHLORIDE 30-0.9 UT/500ML-% IV SOLN
2.5000 [IU]/h | INTRAVENOUS | Status: DC
  Administered 2024-03-06: 2.5 [IU]/h via INTRAVENOUS
  Filled 2024-03-06: qty 500

## 2024-03-06 MED ORDER — OXYTOCIN BOLUS FROM INFUSION
333.0000 mL | Freq: Once | INTRAVENOUS | Status: AC
  Administered 2024-03-06: 333 mL via INTRAVENOUS

## 2024-03-06 MED ORDER — EPHEDRINE 5 MG/ML INJ: 10.0000 mg | INTRAVENOUS | Status: DC | PRN

## 2024-03-06 MED ORDER — DIPHENHYDRAMINE HCL 25 MG PO CAPS: 25.0000 mg | ORAL_CAPSULE | Freq: Four times a day (QID) | ORAL | Status: DC | PRN

## 2024-03-06 MED ORDER — DIBUCAINE (PERIANAL) 1 % EX OINT: 1.0000 | TOPICAL_OINTMENT | CUTANEOUS | Status: DC | PRN

## 2024-03-06 MED ORDER — TERBUTALINE SULFATE 1 MG/ML IJ SOLN
INTRAMUSCULAR | Status: AC
  Filled 2024-03-06: qty 1

## 2024-03-06 MED ORDER — IBUPROFEN 600 MG PO TABS
600.0000 mg | ORAL_TABLET | Freq: Four times a day (QID) | ORAL | Status: DC
  Administered 2024-03-06 – 2024-03-08 (×7): 600 mg via ORAL
  Filled 2024-03-06 (×6): qty 1

## 2024-03-06 MED ORDER — SENNOSIDES-DOCUSATE SODIUM 8.6-50 MG PO TABS
2.0000 | ORAL_TABLET | ORAL | Status: DC
  Administered 2024-03-07 – 2024-03-08 (×2): 2 via ORAL
  Filled 2024-03-06 (×3): qty 2

## 2024-03-06 MED ORDER — DIPHENHYDRAMINE HCL 50 MG/ML IJ SOLN: 12.5000 mg | INTRAMUSCULAR | Status: DC | PRN

## 2024-03-06 MED ORDER — FENTANYL-BUPIVACAINE-NACL 0.5-0.125-0.9 MG/250ML-% EP SOLN
12.0000 mL/h | EPIDURAL | Status: DC | PRN
  Administered 2024-03-06: 12 mL/h via EPIDURAL
  Filled 2024-03-06: qty 250

## 2024-03-06 MED ORDER — TETANUS-DIPHTH-ACELL PERTUSSIS 5-2.5-18.5 LF-MCG/0.5 IM SUSY: 0.5000 mL | PREFILLED_SYRINGE | Freq: Once | INTRAMUSCULAR | Status: DC

## 2024-03-06 MED ORDER — MEASLES, MUMPS & RUBELLA VAC IJ SOLR: 0.5000 mL | Freq: Once | INTRAMUSCULAR | Status: DC

## 2024-03-06 MED ORDER — ERYTHROMYCIN 5 MG/GM OP OINT
TOPICAL_OINTMENT | OPHTHALMIC | Status: AC
  Filled 2024-03-06: qty 1

## 2024-03-06 MED ORDER — SODIUM CHLORIDE 0.9% FLUSH: 3.0000 mL | INTRAVENOUS | Status: DC | PRN

## 2024-03-06 MED ORDER — WITCH HAZEL-GLYCERIN EX PADS: 1.0000 | MEDICATED_PAD | CUTANEOUS | Status: DC | PRN

## 2024-03-06 MED ORDER — COCONUT OIL OIL: 1.0000 | TOPICAL_OIL | Status: DC | PRN

## 2024-03-06 MED ORDER — ONDANSETRON HCL 4 MG PO TABS: 4.0000 mg | ORAL_TABLET | ORAL | Status: DC | PRN

## 2024-03-06 MED ORDER — BENZOCAINE-MENTHOL 20-0.5 % EX AERO
1.0000 | INHALATION_SPRAY | CUTANEOUS | Status: DC | PRN
Start: 2024-03-06 — End: 2024-03-08
  Administered 2024-03-07: 1 via TOPICAL
  Filled 2024-03-06 (×2): qty 56

## 2024-03-06 MED ORDER — LIDOCAINE HCL (PF) 1 % IJ SOLN
INTRAMUSCULAR | Status: DC | PRN
Start: 2024-03-06 — End: 2024-03-06
  Administered 2024-03-06: 10 mL via EPIDURAL

## 2024-03-06 MED ORDER — PRENATAL MULTIVITAMIN CH
1.0000 | ORAL_TABLET | Freq: Every day | ORAL | Status: DC
  Administered 2024-03-07 – 2024-03-08 (×2): 1 via ORAL
  Filled 2024-03-06 (×2): qty 1

## 2024-03-06 MED ORDER — ONDANSETRON HCL 4 MG/2ML IJ SOLN: 4.0000 mg | Freq: Four times a day (QID) | INTRAMUSCULAR | Status: DC | PRN

## 2024-03-06 MED ORDER — FAMOTIDINE 20 MG PO TABS
10.0000 mg | ORAL_TABLET | Freq: Once | ORAL | Status: AC
  Administered 2024-03-06: 10 mg via ORAL
  Filled 2024-03-06: qty 1

## 2024-03-06 MED ORDER — SODIUM CHLORIDE 0.9% FLUSH: 3.0000 mL | Freq: Two times a day (BID) | INTRAVENOUS | Status: DC

## 2024-03-06 MED ORDER — PHENYLEPHRINE 80 MCG/ML (10ML) SYRINGE FOR IV PUSH (FOR BLOOD PRESSURE SUPPORT): 80.0000 ug | PREFILLED_SYRINGE | INTRAVENOUS | Status: DC | PRN

## 2024-03-06 MED ORDER — LACTATED RINGERS IV SOLN: INTRAVENOUS | Status: DC

## 2024-03-06 MED ORDER — ZOLPIDEM TARTRATE 5 MG PO TABS: 5.0000 mg | ORAL_TABLET | Freq: Every evening | ORAL | Status: DC | PRN

## 2024-03-06 MED ORDER — SODIUM CHLORIDE 0.9 % IV SOLN
250.0000 mL | INTRAVENOUS | Status: DC | PRN
Start: 2024-03-06 — End: 2024-03-08

## 2024-03-06 MED ORDER — LACTATED RINGERS IV SOLN: 500.0000 mL | Freq: Once | INTRAVENOUS | Status: DC

## 2024-03-06 MED ORDER — SIMETHICONE 80 MG PO CHEW: 80.0000 mg | CHEWABLE_TABLET | ORAL | Status: DC | PRN

## 2024-03-06 MED ORDER — ONDANSETRON HCL 4 MG/2ML IJ SOLN: 4.0000 mg | INTRAMUSCULAR | Status: DC | PRN

## 2024-03-06 MED ORDER — LIDOCAINE HCL (PF) 1 % IJ SOLN: 30.0000 mL | INTRAMUSCULAR | Status: DC | PRN

## 2024-03-06 MED ORDER — SOD CITRATE-CITRIC ACID 500-334 MG/5ML PO SOLN
30.0000 mL | ORAL | Status: DC | PRN
  Filled 2024-03-06: qty 30

## 2024-03-06 NOTE — MAU Note (Signed)
 Ctx started at 0800

## 2024-03-06 NOTE — Progress Notes (Signed)
 LABOR PROGRESS NOTE  Patient Name: Rhonda Dixon, female   DOB: 05-08-2001, 23 y.o.  MRN: [id number]  Called to bedside for prolonged deceleration. Resolved with position changes. FSE placed for accurate FHT monitoring after verbal consent obtained from patient. Resolved without terbutaline . Anticipate NSVD. Back to Cat I.  Almarie CHRISTELLA Moats, MD

## 2024-03-06 NOTE — H&P (Cosign Needed Addendum)
 OBSTETRIC ADMISSION HISTORY AND PHYSICAL  Rhonda Dixon is a 23 y.o. female G3P1011 with IUP at [redacted]w[redacted]d by 6 wk US  presenting for SOL . She reports +FMs, No LOF, no VB, no blurry vision, headaches or peripheral edema, and RUQ pain.  She plans on breast and bottle feeding. She is undecided on birth control. Patient has received epidural, she reports feeling some contractions and she is tolerating pain well.   She received her prenatal care at [facility]   Dating: By 6 week US   --->  Estimated Date of Delivery: 03/10/24  Sono: @32  w3 d, CWD, normal anatomy, cephalic presentation, anterior placenta , 2203g, 73% EFW   Prenatal History/Complications:  - GERD  - Obesity  - Hx of BV    Nursing Staff Provider  Office Location  [facility]  Dating   6wk U/S  Language   English Anatomy US   Normal. Growth scan sched 5/6 for elev BMI  Flu Vaccine  10/19/23 Genetic Screen  NIPS: low risk panorama   TDaP vaccine   12/23/2023 Hgb A1C or  GTT Early 98,normal Third trimester   Rhogam  N/a   LAB RESULTS   Feeding Plan breast Blood Type --/--/B POS (06/24 1017)   Contraception undecided Antibody NEG (06/24 1017)  Circumcision yes Rubella 1.64 (10/29 1544)  Pediatrician  [facility] RPR Non Reactive (04/11 1549)   Support Person  HBsAg Negative (10/29 1544)   Prenatal Classes  HIV Non Reactive (04/11 1549)  BTL Consent  GBS Negative/-- (06/05 1618)Collected 03/06/24  VBAC Consent  Pap  LSIL on colpo--repeat due 02/2024  Resident PCP   Jagadish Hgb Electro  Normal  COVID Vax   CF   Hep C Antibody   Negative SMA   Aspirin  indicated   Yes Waterbirth  Refer to [facility] -> [ ]  Class [ ]  Consent [ ]  CNM visit    Past Medical History: Past Medical History:  Diagnosis Date   ALLERGIC RHINITIS, SEASONAL 04/01/2009   Qualifier: Diagnosis of   By: Adella MD, Kelten Enochs         Anxiety    Cold sore 10/11/2022   Ear itch 06/06/2023   Encounter for counseling regarding contraception 07/23/2021   Medical history non-contributory      Past Surgical History: Past Surgical History:  Procedure Laterality Date   INDUCED ABORTION  12/2022    Obstetrical History: OB History     Gravida  3   Para  1   Term  1   Preterm      AB  1   Living  1      SAB      IAB      Ectopic      Multiple  0   Live Births  1           Social History Social History   Socioeconomic History   Marital status: Single    Spouse name: Not on file   Number of children: Not on file   Years of education: Not on file   Highest education level: Not on file  Occupational History   Not on file  Tobacco Use   Smoking status: Never   Smokeless tobacco: Never  Vaping Use   Vaping status: Former   Substances: Nicotine, Flavoring  Substance and Sexual Activity   Alcohol use: Not Currently    Comment: occ   Drug use: No   Sexual activity: Yes    Birth control/protection: None  Other Topics Concern  Not on file  Social History Narrative   Not on file   Social Drivers of Health   Financial Resource Strain: Not on file  Food Insecurity: No Food Insecurity (03/06/2024)   Hunger Vital Sign    Worried About Running Out of Food in the Last Year: Never true    Ran Out of Food in the Last Year: Never true  Transportation Needs: No Transportation Needs (03/06/2024)   PRAPARE - Administrator, Civil Service (Medical): No    Lack of Transportation (Non-Medical): No  Physical Activity: Not on file  Stress: Not on file  Social Connections: Moderately Integrated (03/06/2024)   Social Connection and Isolation Panel    Frequency of Communication with Friends and Family: Once a week    Frequency of Social Gatherings with Friends and Family: Once a week    Attends Religious Services: 1 to 4 times per year    Active Member of [employer] or Organizations: No    Attends Engineer, structural: 1 to 4 times per year    Marital Status: Living with partner    Family History: Family History  Problem Relation Age  of Onset   Hypertension Mother    Asthma Mother    Lupus Mother    Autoimmune disease Mother    Clotting disorder Mother    Asthma Sister    Hypertension Maternal Grandmother    Cancer Maternal Grandmother    Diabetes Other    Heart disease Neg Hx     Allergies: No Known Allergies  Medications Prior to Admission  Medication Sig Dispense Refill Last Dose/Taking   famotidine  (PEPCID ) 20 MG tablet Take 1 tablet (20 mg total) by mouth daily. 30 tablet 1 03/05/2024   Prenatal 27-1 MG TABS Take 1 tablet by mouth daily. 30 tablet 0 03/05/2024   aspirin  EC 81 MG tablet Take 1 tablet (81 mg total) by mouth daily. Swallow whole. 90 tablet 1    fluticasone  (FLONASE ) 50 MCG/ACT nasal spray Place 2 sprays into both nostrils daily. 16 g 6    metroNIDAZOLE  (FLAGYL ) 500 MG tablet Take 1 tablet (500 mg total) by mouth 2 (two) times daily. 14 tablet 0      Review of Systems   All systems reviewed and negative except as stated in HPI  Blood pressure 92/68, pulse 96, temperature 98 F (36.7 C), temperature source Oral, resp. rate 18, height 5' 4 (1.626 m), weight 97.5 kg, last menstrual period 05/30/2023, SpO2 100%, not currently breastfeeding. General appearance: cooperative Heart: regular rate and rhythm Abdomen: soft, non-tender; bowel sounds normal Pelvic: Exam per RN  Extremities: Homans sign is negative, no sign of DVT Presentation: cephalic Fetal monitoringBaseline: 120 bpm, Variability: Good {> 6 bpm), Accelerations: Reactive, and Decelerations: Absent Uterine activity- Initially every 4 movements but contractions are spacy now   Dilation: 5 Effacement (%): 90 Station: -1 Exam by:: H.Price, RN   Prenatal labs: ABO, Rh: B/Positive/-- (10/29 1544) Antibody: Negative (10/29 1544) Rubella: 1.64 (10/29 1544) RPR: Non Reactive (04/11 1549)  HBsAg: Negative (10/29 1544)  HIV: Non Reactive (04/11 1549)  GBS: Negative/-- (06/05 1618)    Lab Results  Component Value Date   GBS  Negative 02/16/2024    Immunization History  Administered Date(s) Administered   DTaP 05/23/2000, 10/12/2000, 12/01/2000, 10/01/2002, 05/05/2005   H1N1 07/25/2008   HIB (PRP-OMP) 05/23/2000, 10/12/2000, 12/01/2000, 04/25/2001   HPV Quadrivalent 04/01/2009, 05/28/2011, 05/12/2012   Hepatitis A 04/01/2009, 05/28/2011   Hepatitis B 07-25-2000, 04/25/2000,  05/23/2000, 10/12/2000   IPV 05/23/2000, 11/22/2000, 12/30/2000, 05/05/2005   Influenza Split 05/28/2011, 08/14/2012   Influenza, Seasonal, Injecte, Preservative Fre 10/19/2023   Influenza,inj,Quad PF,6+ Mos 06/07/2014, 07/07/2016, 07/11/2017   MMR 04/25/2001, 05/05/2005   Meningococcal Conjugate 05/28/2011, 07/08/2016   PFIZER Comirnaty [place] Top)Covid-19 Tri-Sucrose Vaccine 11/03/2021, 11/24/2021   Pneumococcal Conjugate-13 05/23/2000, 12/30/2000, 03/10/2001, 04/25/2001   Tdap 05/28/2011, 04/13/2022, 12/23/2023   Varicella 10/01/2002, 04/01/2008    Prenatal Transfer Tool  Maternal Diabetes: No Genetic Screening: Normal Maternal Ultrasounds/Referrals: Normal Fetal Ultrasounds or other Referrals:  None Maternal Substance Abuse:  No Significant Maternal Medications:  None Significant Maternal Lab Results: None Number of Prenatal Visits:greater than 3 verified prenatal visits Maternal Vaccinations:TDap and Flu Other Comments:  None   Results for orders placed or performed during the hospital encounter of 03/06/24 (from the past 24 hours)  CBC   Collection Time: 03/06/24 10:20 AM  Result Value Ref Range   WBC 8.3 4.0 - 10.5 K/uL   RBC 4.44 3.87 - 5.11 MIL/uL   Hemoglobin 13.0 12.0 - 15.0 g/dL   HCT 60.2 63.9 - 53.9 %   MCV 89.4 80.0 - 100.0 fL   MCH 29.3 26.0 - 34.0 pg   MCHC 32.7 30.0 - 36.0 g/dL   RDW 85.9 88.4 - 84.4 %   Platelets 354 150 - 400 K/uL   nRBC 0.0 0.0 - 0.2 %    Patient Active Problem List   Diagnosis Date Noted   Indication for care in labor or delivery 03/06/2024   Obesity affecting pregnancy,  antepartum 11/03/2023   Headache 06/06/2023   TMJ (temporomandibular joint disorder) 06/06/2023   Pregnancy 11/30/2022   History of abnormal cervical Pap smear 04/28/2022   Generalized anxiety disorder 10/23/2018   BV (bacterial vaginosis) 08/17/2017   Vaginal discharge 07/12/2017    Assessment/Plan:  Rhonda Dixon is a 23 y.o. G3P1011 at [redacted]w[redacted]d here for SOL.   #Labor: Progressing well,  considered AROM, +/- pitocin   #Pain: Epidural well tolerated  #FWB: Category 1  #GBS status:  negative #Feeding: Breastmilk  and Formula #Reproductive Life planning: Undecided  #Circ:  yes  Rhonda Dixon, Student-PA  03/06/2024, 11:40 AM  Attestation of Supervision of Student:  I confirm that I have verified the information documented in the PA student's note and that I have also personally reperformed the history, physical exam and all medical decision making activities.  I have verified that all services and findings are accurately documented in this student's note; and I agree with management and plan as outlined in the documentation. I have also made any necessary editorial changes.  Almarie CHRISTELLA Moats, MD OB Fellow 03/06/2024 5:15 PM

## 2024-03-06 NOTE — Progress Notes (Signed)
 Labor Progress Note VARONICA SIHARATH is a 23 y.o. G3P1011 at [redacted]w[redacted]d presented for SOL  S: Resting comfortable. Amenable to AROM.  O:  BP 104/68   Pulse 88   Temp 98.2 F (36.8 C)   Resp 18   Ht 5' 4 (1.626 m)   Wt 97.5 kg   LMP 05/30/2023   SpO2 100%   BMI 36.90 kg/m  EFM: baseline 130 bpm/ moderate variability/ + accels/ no decels  Toco: q2-5 min SVE: Dilation: 7.5 Effacement (%): 80 Station: -1 Presentation: Vertex Exam by:: Dr. Claudene Pitocin : 0 mu/min  A/P: 23 y.o. G3P1011 [redacted]w[redacted]d  1. Labor: AROM, clear fluids at 1654. Will hold augmentation at this time, consider pit if contractions space. 2. FWB: Cat 1 3. Pain: Epidural in place   Anticipate SVD.  Wells DELENA Claudene, MD 5:02 PM

## 2024-03-06 NOTE — Discharge Summary (Signed)
 Postpartum Discharge Summary  Date of Service updated***     Patient Name: Rhonda Dixon DOB: Jul 06, 2001 MRN: [id number]  Date of admission: 03/06/2024 Delivery date:03/06/2024 Delivering provider: NICHOLAUS ALMARIE HERO Date of discharge: 03/06/2024  Admitting diagnosis: Indication for care in labor or delivery [O75.9] Intrauterine pregnancy: [redacted]w[redacted]d     Secondary diagnosis:  Principal Problem:   NSVD (normal spontaneous vaginal delivery) Active Problems:   History of abnormal cervical Pap smear   Pregnancy   Obesity affecting pregnancy, antepartum  Additional problems: ***    Discharge diagnosis: Term Pregnancy Delivered                                              Post partum procedures:{Postpartum procedures:23558} Augmentation: AROM Complications: None  Hospital course: Onset of Labor With Vaginal Delivery      23 y.o. yo G3P1011 at [redacted]w[redacted]d was admitted in Active Labor on 03/06/2024. Labor course was complicated by none.  Membrane Rupture Time/Date: 4:54 PM,03/06/2024  Delivery Method:Vaginal, Spontaneous Operative Delivery:N/A Episiotomy: None Lacerations:  Periurethral Patient had a postpartum course complicated by ***.  She is ambulating, tolerating a regular diet, passing flatus, and urinating well. Patient is discharged home in stable condition on 03/06/24.  Newborn Data: Birth date:03/06/2024 Birth time:7:00 PM Gender:Female Living status:Living Apgars:9 ,9  Weight:   Magnesium  Sulfate received: No BMZ received: No Rhophylac: No MMR:No T-DaP:Given prenatally Flu: N/A RSV Vaccine received: No Transfusion:{Transfusion received:30440034}  Immunizations received: Immunization History  Administered Date(s) Administered   DTaP 05/23/2000, 10/12/2000, 12/01/2000, 10/01/2002, 05/05/2005   H1N1 07/25/2008   HIB (PRP-OMP) 05/23/2000, 10/12/2000, 12/01/2000, 04/25/2001   HPV Quadrivalent 04/01/2009, 05/28/2011, 05/12/2012   Hepatitis A 04/01/2009, 05/28/2011    Hepatitis B 2000-07-02, 04/25/2000, 05/23/2000, 10/12/2000   IPV 05/23/2000, 11/22/2000, 12/30/2000, 05/05/2005   Influenza Split 05/28/2011, 08/14/2012   Influenza, Seasonal, Injecte, Preservative Fre 10/19/2023   Influenza,inj,Quad PF,6+ Mos 06/07/2014, 07/07/2016, 07/11/2017   MMR 04/25/2001, 05/05/2005   Meningococcal Conjugate 05/28/2011, 07/08/2016   PFIZER Comirnaty (Gray Top)Covid-19 Tri-Sucrose Vaccine 11/03/2021, 11/24/2021   Pneumococcal Conjugate-13 05/23/2000, 12/30/2000, 03/10/2001, 04/25/2001   Tdap 05/28/2011, 04/13/2022, 12/23/2023   Varicella 10/01/2002, 04/01/2008    Physical exam  Vitals:   03/06/24 1600 03/06/24 1730 03/06/24 1800 03/06/24 1830  BP: 104/68 118/66 (!) 110/91 104/67  Pulse: 88 73 (!) 126 82  Resp:      Temp:      TempSrc:      SpO2:   100% 99%  Weight:      Height:       General: {Exam; general:21111117} Lochia: {Desc; appropriate/inappropriate:30686::appropriate} Uterine Fundus: {Desc; firm/soft:30687} Incision: {Exam; incision:21111123} DVT Evaluation: {Exam; dvt:2111122} Labs: Lab Results  Component Value Date   WBC 8.3 03/06/2024   HGB 13.0 03/06/2024   HCT 39.7 03/06/2024   MCV 89.4 03/06/2024   PLT 354 03/06/2024      Latest Ref Rng & Units 12/29/2021    4:20 PM  CMP  Glucose 70 - 99 mg/dL 85   BUN 6 - 20 mg/dL <5   Creatinine 9.55 - 1.00 mg/dL 9.33   Sodium 864 - 854 mmol/L 136   Potassium 3.5 - 5.1 mmol/L 3.5   Chloride 98 - 111 mmol/L 107   CO2 22 - 32 mmol/L 23   Calcium 8.9 - 10.3 mg/dL 8.9   Total Protein 6.5 - 8.1 g/dL 6.0  Total Bilirubin 0.3 - 1.2 mg/dL 0.5   Alkaline Phos 38 - 126 U/L 39   AST 15 - 41 U/L 17   ALT 0 - 44 U/L 16    Edinburgh Score:    06/14/2022    2:02 PM  Edinburgh Postnatal Depression Scale Screening Tool  I have been able to laugh and see the funny side of things. 0  I have looked forward with enjoyment to things. 1  I have blamed myself unnecessarily when things went wrong. 2  I  have been anxious or worried for no good reason. 2  I have felt scared or panicky for no good reason. 0  Things have been getting on top of me. 2  I have been so unhappy that I have had difficulty sleeping. 1  I have felt sad or miserable. 3  I have been so unhappy that I have been crying. 1  The thought of harming myself has occurred to me. 0  Edinburgh Postnatal Depression Scale Total 12      Data saved with a previous flowsheet row definition   No data recorded  After visit meds:  Allergies as of 03/06/2024   No Known Allergies   Med Rec must be completed prior to using this [facility]***        Discharge home in stable condition Infant Feeding: {Baby feeding:23562} Infant Disposition:{CHL IP OB HOME WITH FNUYZM:76418} Discharge instruction: per After Visit Summary and Postpartum booklet. Activity: Advance as tolerated. Pelvic rest for 6 weeks.  Diet: {OB ipzu:78888878} Future Appointments: Future Appointments  Date Time Provider Department Center  03/12/2024 11:00 AM [facility] PROVIDER 1 [facility]  03/12/2024 11:30 AM [facility] US5 [facility]   Follow up Visit:  Message sent to [facility] 6/24  Please schedule this patient for a In person postpartum visit in 6 weeks with the following provider: Any provider. Additional Postpartum F/U:PP pap  Low risk pregnancy complicated by: none Delivery mode:  Vaginal, Spontaneous Anticipated Birth Control:  Unsure   03/06/2024 Almarie CHRISTELLA Moats, MD

## 2024-03-06 NOTE — Anesthesia Preprocedure Evaluation (Signed)
 Anesthesia Evaluation  Patient identified by MRN, date of birth, ID band Patient awake    Reviewed: Allergy & Precautions, NPO status , Patient's Chart, lab work & pertinent test results  Airway Mallampati: II  TM Distance: >3 FB Neck ROM: Full    Dental no notable dental hx.    Pulmonary neg pulmonary ROS   Pulmonary exam normal breath sounds clear to auscultation       Cardiovascular negative cardio ROS Normal cardiovascular exam Rhythm:Regular Rate:Normal     Neuro/Psych  Headaches PSYCHIATRIC DISORDERS Anxiety        GI/Hepatic negative GI ROS, Neg liver ROS,,,  Endo/Other  negative endocrine ROS    Renal/GU negative Renal ROS  negative genitourinary   Musculoskeletal negative musculoskeletal ROS (+)    Abdominal   Peds  Hematology negative hematology ROS (+)   Anesthesia Other Findings Presents in labor  Reproductive/Obstetrics (+) Pregnancy                             Anesthesia Physical Anesthesia Plan  ASA: 2  Anesthesia Plan: Epidural   Post-op Pain Management:    Induction:   PONV Risk Score and Plan: Treatment may vary due to age or medical condition  Airway Management Planned: Natural Airway  Additional Equipment:   Intra-op Plan:   Post-operative Plan:   Informed Consent: I have reviewed the patients History and Physical, chart, labs and discussed the procedure including the risks, benefits and alternatives for the proposed anesthesia with the patient or authorized representative who has indicated his/her understanding and acceptance.       Plan Discussed with: Anesthesiologist  Anesthesia Plan Comments: (Patient identified. Risks, benefits, options discussed with patient including but not limited to bleeding, infection, nerve damage, paralysis, failed block, incomplete pain control, headache, blood pressure changes, nausea, vomiting, reactions to  medication, itching, and post partum back pain. Confirmed with bedside nurse the patient's most recent platelet count. Confirmed with the patient that they are not taking any anticoagulation, have any bleeding history or any family history of bleeding disorders. Patient expressed understanding and wishes to proceed. All questions were answered. )       Anesthesia Quick Evaluation

## 2024-03-06 NOTE — Anesthesia Procedure Notes (Signed)
 Epidural Patient location during procedure: OB Start time: 03/06/2024 10:50 AM End time: 03/06/2024 11:00 AM  Staffing Anesthesiologist: Niels Marien CROME, MD Performed: anesthesiologist   Preanesthetic Checklist Completed: patient identified, IV checked, risks and benefits discussed, monitors and equipment checked, pre-op evaluation and timeout performed  Epidural Patient position: sitting Prep: DuraPrep and site prepped and draped Patient monitoring: continuous pulse ox, blood pressure, heart rate and cardiac monitor Approach: midline Location: L3-L4 Injection technique: LOR air  Needle:  Needle type: Tuohy  Needle gauge: 17 G Needle length: 9 cm Needle insertion depth: 6 cm Catheter type: closed end flexible Catheter size: 19 Gauge Catheter at skin depth: 11 cm Test dose: negative  Assessment Sensory level: T8 Events: blood not aspirated, no cerebrospinal fluid, injection not painful, no injection resistance, no paresthesia and negative IV test  Additional Notes Patient identified. Risks/Benefits/Options discussed with patient including but not limited to bleeding, infection, nerve damage, paralysis, failed block, incomplete pain control, headache, blood pressure changes, nausea, vomiting, reactions to medication both or allergic, itching and postpartum back pain. Confirmed with bedside nurse the patient's most recent platelet count. Confirmed with patient that they are not currently taking any anticoagulation, have any bleeding history or any family history of bleeding disorders. Patient expressed understanding and wished to proceed. All questions were answered. Sterile technique was used throughout the entire procedure. Please see nursing notes for vital signs. Test dose was given through epidural catheter and negative prior to continuing to dose epidural or start infusion. Warning signs of high block given to the patient including shortness of breath, tingling/numbness in  hands, complete motor block, or any concerning symptoms with instructions to call for help. Patient was given instructions on fall risk and not to get out of bed. All questions and concerns addressed with instructions to call with any issues or inadequate analgesia.  Reason for block:procedure for pain

## 2024-03-07 LAB — CBC
HCT: 34.8 % — ABNORMAL LOW (ref 36.0–46.0)
Hemoglobin: 11.6 g/dL — ABNORMAL LOW (ref 12.0–15.0)
MCH: 29.7 pg (ref 26.0–34.0)
MCHC: 33.3 g/dL (ref 30.0–36.0)
MCV: 89 fL (ref 80.0–100.0)
Platelets: 353 10*3/uL (ref 150–400)
RBC: 3.91 MIL/uL (ref 3.87–5.11)
RDW: 14.3 % (ref 11.5–15.5)
WBC: 12.2 10*3/uL — ABNORMAL HIGH (ref 4.0–10.5)
nRBC: 0 % (ref 0.0–0.2)

## 2024-03-07 NOTE — Lactation Note (Signed)
 This note was copied from a baby's chart. Lactation Consultation Note  Patient Name: Rhonda Dixon Unijb'd Date: 03/07/2024 Age:24 hours  Lc attempted to see mom but everyone was sleeping.   Maternal Data    Feeding Nipple Type: Slow - flow  LATCH Score Latch: Repeated attempts needed to sustain latch, nipple held in mouth throughout feeding, stimulation needed to elicit sucking reflex.  Audible Swallowing: A few with stimulation  Type of Nipple: Flat  Comfort (Breast/Nipple): Soft / non-tender  Hold (Positioning): Full assist, staff holds infant at breast  [facility] Score: 5   Lactation Tools Discussed/Used    Interventions    Discharge    Consult Status      Janaiyah Blackard G 03/07/2024, 12:32 AM

## 2024-03-07 NOTE — Lactation Note (Signed)
 This note was copied from a baby's chart. Lactation Consultation Note  Patient Name: Boy Branden Vine Unijb'd Date: 03/07/2024 Age:24 hours Reason for consult: Initial assessment  P2, Mother planning on sleeping but willing to speak with LC. Mother would like to breastfeed and supplement with formula. Suggest offering the breast before formula to help mother establish her milk supply.  Encouraged mother to call for [facility] to view latch later today.  Maternal Data Has patient been taught Hand Expression?: Yes Does the patient have breastfeeding experience prior to this delivery?: Yes How long did the patient breastfeed?: 3 days and then pumped for 3 months  Feeding Mother's Current Feeding Choice: Breast Milk and Formula  Lactation Tools Discussed/Used Tools: Pump Breast pump type: Manual Pump Education: Milk Storage Pumping frequency: PRN  Interventions Interventions: Education;Hand pump;LC Services brochure  Discharge Pump: Advised to call insurance company;Manual  Consult Status Consult Status: Follow-up Date: 03/07/24 Follow-up type: In-patient   Shannon Levorn Lemme  RN, IBCLC 03/07/2024, 8:48 AM

## 2024-03-07 NOTE — Progress Notes (Signed)
 POSTPARTUM PROGRESS NOTE  Post Partum Day 1 Subjective:  Rhonda Dixon is a 23 y.o. H6E7987 [redacted]w[redacted]d s/p nsvd.  No acute events overnight.  Pt denies problems with ambulating, voiding or po intake.  She denies nausea or vomiting.  Pain is well controlled.  She has had flatus. She has not had bowel movement.  Lochia Small.   Objective: Blood pressure 105/75, pulse 88, temperature 98 F (36.7 C), temperature source Oral, resp. rate 17, height 5' 4 (1.626 m), weight 97.5 kg, last menstrual period 05/30/2023, SpO2 100%, unknown if currently breastfeeding.  Physical Exam:  General: alert, cooperative and no distress Lochia:normal flow Chest: CTAB Heart: RRR no m/r/g Abdomen: +BS, soft, nontender,  Uterine Fundus: firm,  DVT Evaluation: No calf swelling or tenderness Extremities: no edema  Recent Labs    03/06/24 1020 03/07/24 0754  HGB 13.0 11.6*  HCT 39.7 34.8*    Assessment/Plan:  ASSESSMENT: Rhonda Dixon is a 23 y.o. H6E7987 [redacted]w[redacted]d s/p nsvd, doing well. Bleeding appropriate. Breast and bottle feeding. Consented for circ today.   Plan for discharge tomorrow   LOS: 1 day   Devaughn KATHEE Ban 03/07/2024, 9:09 AM

## 2024-03-07 NOTE — Lactation Note (Signed)
 This note was copied from a baby's chart. Lactation Consultation Note  Patient Name: Rhonda Dixon Unijb'd Date: 03/07/2024 Age:24 hours  LC attempted to see mom again but everyone still sleeping.   Maternal Data    Feeding    LATCH Score                    Lactation Tools Discussed/Used    Interventions    Discharge    Consult Status      Rhonda Dixon 03/07/2024, 2:16 AM

## 2024-03-07 NOTE — Patient Instructions (Addendum)
 If interested in an outpatient lactation consult in office or virtually please reach out to us  at [facility] (First Floor) [address] [place] Please call [phone] and press [phone] for lactation.    Melodi Sprung, [facility] for [facility]

## 2024-03-08 MED ORDER — IBUPROFEN 600 MG PO TABS: 600.0000 mg | ORAL_TABLET | Freq: Four times a day (QID) | ORAL | 0 refills | Status: AC

## 2024-03-08 MED ORDER — BENZOCAINE-MENTHOL 20-0.5 % EX AERO: 1.0000 | INHALATION_SPRAY | CUTANEOUS | 3 refills | Status: AC | PRN

## 2024-03-08 MED ORDER — PREPLUS 27-1 MG PO TABS
1.0000 | ORAL_TABLET | Freq: Every day | ORAL | 13 refills | Status: AC
Start: 2024-03-08 — End: ?

## 2024-03-08 MED ORDER — SENNOSIDES-DOCUSATE SODIUM 8.6-50 MG PO TABS: 2.0000 | ORAL_TABLET | ORAL | 0 refills | Status: AC

## 2024-03-08 NOTE — Progress Notes (Signed)
CSW acknowledged consult and completed a clinical assessment.  There are no barriers to d/c.  Clinical assessment notes will be entered at a later time.   Enos Fling, Theresia Majors Clinical Social Worker [phone]

## 2024-03-08 NOTE — Lactation Note (Signed)
 This note was copied from a baby's chart. Lactation Consultation Note  Patient Name: Rhonda Dixon Unijb'd Date: 03/08/2024 Age:24 hours  Reason for consult: Follow-up assessment;Term  P2, [redacted]w[redacted]d, 2% weight loss  Follow up LC visit. Mother is formula feeding and latching baby some. She reports having experience with milk production and breast fullness with her last baby. She decided to pump and feed baby by bottle. She says she may pump and bottle with this baby once her milk comes in.   Discussed the process of milk production, supply and demand and the importance of breast stimulation and milk removal in order to make an optimal milk supply.  Discussed mother to breastfeed 8-12 times in 24 hours, skin to skin and breast feed before formula feeding.   If missed feedings at breast or substituting feeding with formula, advised to hand express and/or pump to remove milk from the breast.   Discussed management of engorgement, signs and symptom of mastitis and if occurs to report to patient's OB provider. Discussed Lactogenesis II/ supply and demand for maintaining milk supply.  Mom made aware of O/P services, breastfeeding support groups, community resources, and our phone # for post-discharge questions.      Interventions Interventions: Breast feeding basics reviewed;Education  Discharge Discharge Education: Engorgement and breast care;Warning signs for feeding baby  Consult Status Consult Status: Complete Date: 03/08/24    Joshua Rojelio HERO 03/08/2024, 12:58 PM

## 2024-03-08 NOTE — Progress Notes (Signed)
 CSW received consult due to score 16 on Edinburgh Depression Screen, anxiety and depression. CSW met MOB at bedside to complete a mental health assessment and offer support. CSW entered the room, introduced herself and acknowledged that her mom was present. MOB gave CSW verbal permission to speak about anything while her mom was present. CSW explained her role and the reason for the visit. MOB was polite, easy to engage, receptive to meeting with CSW, and appeared forthcoming.  CSW acknowledged Edinburgh score of 16. MOB reported no concerns.Patient requests a referral to Integrated Behavioral Health. Patient verbalizes understanding that the appointment will be virtual.   CSW inquired about MOB's mental health history. MOB reported being diagnosed with anxiety and depression in high school. MOB reported being prescribed medication in the past; however denied participating in therapy for support. MOB reported overtime she had used listening to music as a coping skill for support. 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 CSW provided MOB [place] resources [place]. MOB reported her supports as FOB and her mom. CSW provided education regarding Baby Blues vs PMADs and provided MOB with resources for mental health follow up.  CSW encouraged MOB to evaluate her mental health throughout the postpartum period with the use of the New Mom Checklist developed by Postpartum Progress as well as the [place] Postnatal Depression Scale and notify a medical professional if symptoms arise.   CSW asked MOB has she selected a pediatrician for the infant's follow up visits; MOB said [facility].  MOB reported having all essential items for the infant including a carseat, bassinet and crib for safe sleeping. CSW provided review of Sudden Infant Death Syndrome (SIDS) precautions.  CSW identifies no further need for  intervention and no barriers to discharge at this time.  Rosina Molt, [place] Clinical Social Worker [phone]

## 2024-03-08 NOTE — Anesthesia Postprocedure Evaluation (Signed)
 Anesthesia Post Note  Patient: Rhonda Dixon  Procedure(s) Performed: AN AD HOC LABOR EPIDURAL     Patient location during evaluation: Mother Baby Anesthesia Type: Epidural Level of consciousness: awake and alert and oriented Pain management: satisfactory to patient Vital Signs Assessment: post-procedure vital signs reviewed and stable Respiratory status: respiratory function stable Cardiovascular status: stable Postop Assessment: no headache, no backache, epidural receding, patient able to bend at knees, no signs of nausea or vomiting, adequate PO intake and able to ambulate Anesthetic complications: no   No notable events documented.  Last Vitals:  Vitals:   03/07/24 2105 03/08/24 0605  BP: 110/66 100/61  Pulse: 94 63  Resp: 18 17  Temp: 37.6 C (!) 36.3 C  SpO2: 99% 100%    Last Pain:  Vitals:   03/08/24 0605  TempSrc: Axillary  PainSc: Asleep   Pain Goal:                   Maliek Schellhorn

## 2024-03-09 ENCOUNTER — Telehealth (HOSPITAL_COMMUNITY): Payer: Self-pay | Admitting: *Deleted

## 2024-03-09 ENCOUNTER — Ambulatory Visit (INDEPENDENT_AMBULATORY_CARE_PROVIDER_SITE_OTHER): Admitting: Family Medicine

## 2024-03-09 ENCOUNTER — Other Ambulatory Visit: Payer: Self-pay | Admitting: Family Medicine

## 2024-03-09 DIAGNOSIS — R4589 Other symptoms and signs involving emotional state: Secondary | ICD-10-CM | POA: Diagnosis present

## 2024-03-09 DIAGNOSIS — Z1331 Encounter for screening for depression: Secondary | ICD-10-CM

## 2024-03-09 MED ORDER — ESCITALOPRAM OXALATE 10 MG PO TABS: 10.0000 mg | ORAL_TABLET | Freq: Every day | ORAL | 3 refills | Status: AC

## 2024-03-09 NOTE — Telephone Encounter (Signed)
 EPDS score in the hospital was 16, answer to question ten was 0-Never. Per CSW note dated 03/08/2024 patient requests IBH referral. Placed order for [facility] referral.  Dr. Kandis notified via [facility] order.  Mliss Sieve, RN 03/09/2024 09:28

## 2024-03-10 ENCOUNTER — Inpatient Hospital Stay (HOSPITAL_COMMUNITY): Admit: 2024-03-10

## 2024-03-11 NOTE — Progress Notes (Signed)
    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 - Pt presents for her newborn's first visit. 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  - Deneis SI   OBJECTIVE:   LMP 05/30/2023   General: Alert, pleasant woman. NAD. HEENT: NCAT. MMM. CV: RRR, no murmurs.  Resp: CTAB, no wheezing or crackles. Normal WOB on RA.  Abm: Soft, nontender, nondistended. BS present. Ext: Moves all ext spontaneously Skin: Warm, well perfused  Psych: Denies SI  ASSESSMENT/PLAN:   Assessment & Plan Depressed mood C/f worsening depressed mood and potential PDD. Given hx of PDD, pt would benefit from starting medications before symptoms get too bad.  - Start lexapro  10mg  daily - Counseled on side effects - F/u in 6 wks for her postpoartum visit  Rhonda Nearing, MD [facility]

## 2024-03-12 ENCOUNTER — Other Ambulatory Visit

## 2024-03-13 ENCOUNTER — Telehealth: Payer: Self-pay | Admitting: Student

## 2024-03-13 NOTE — Telephone Encounter (Signed)
-----   Message from Rhonda Dixon sent at 03/06/2024  7:20 PM EDT ----- Regarding: PP schedule f/up Please schedule this patient for a In person postpartum visit in 6 weeks with the following provider: Any provider. Additional Postpartum F/U: PP pap   Low risk pregnancy complicated by:  none Delivery mode: Vaginal, Spontaneous Anticipated Birth Control: Unsure

## 2024-03-13 NOTE — Telephone Encounter (Signed)
 Called patient to schedule rings and rings and say number not in service.

## 2024-04-06 ENCOUNTER — Telehealth: Payer: Self-pay | Admitting: Clinical

## 2024-04-06 NOTE — Telephone Encounter (Signed)
 Attempt call regarding referral; Unable to leave message as number is out of service.

## 2024-04-10 ENCOUNTER — Other Ambulatory Visit (HOSPITAL_COMMUNITY)
Admission: RE | Admit: 2024-04-10 | Discharge: 2024-04-10 | Disposition: A | Discharge status: Home / Self Care | Source: Ambulatory Visit | Attending: Family Medicine | Admitting: Family Medicine

## 2024-04-10 ENCOUNTER — Ambulatory Visit: Admitting: Family Medicine

## 2024-04-10 VITALS — BP 105/78 | HR 76 | Ht 64.0 in | Wt 196.2 lb

## 2024-04-10 DIAGNOSIS — N939 Abnormal uterine and vaginal bleeding, unspecified: Secondary | ICD-10-CM | POA: Insufficient documentation

## 2024-04-10 LAB — POCT URINE PREGNANCY: Preg Test, Ur: NEGATIVE

## 2024-04-10 LAB — POCT HEMOGLOBIN: Hemoglobin: 13.8 g/dL (ref 11–14.6)

## 2024-04-10 NOTE — Patient Instructions (Signed)
 It was great to see you today! Thank you for choosing [facility] for your primary care. Rhonda Dixon was seen for vaginal bleeding.  Today we addressed: Vaginal bleeding - likely your period restarting, but will rule out other issues with a vaginal ultrasound.  You can get this done at [facility] imaging located [address].  Hemoglobin and vitals were normal during your visit.  Rico will call [facility] and reach out to you about when it is scheduled. Pap smear today with STD screening ED precautions If you feel dizzy, lightheaded, or profusely bleeding continuously recommend going to the ED  You should return to our clinic No follow-ups on file. Please arrive 15 minutes before your appointment to ensure smooth check in process.  We appreciate your efforts in making this happen.  Thank you for allowing me to participate in your care, [name], DO 04/10/2024, 12:36 PM PGY-2, [facility]

## 2024-04-10 NOTE — Progress Notes (Signed)
    SUBJECTIVE:   CHIEF COMPLAINT / HPI:   Vaginal Bleeding - Started 1-2 days ago, heavy soaking through 4 thin pads at time and feels it gushing constantly - Soaking through pads or adult pampers within a couple minutes - Periurethral tears at delivery on 6/24, repaired with 2 sutures - Had sexual intercourse last week, but no bleeding or discomfort during or after - History of blood clots (mother) who is on blood thinners, but no personal history - History of heavy menstrual cycles, but never soaked those pads this consistently - Stopped breast feeding a couple weeks ago, now just bottle feeding  PERTINENT  PMH / PSH: CIN-1, anxiety  OBJECTIVE:   BP 105/78   Pulse 76   Ht 5' 4 (1.626 m)   Wt 196 lb 3.2 oz (89 kg)   LMP 05/30/2023   SpO2 100%   BMI 33.68 kg/m   General: Awake and Alert in NAD HEENT: NCAT. Sclera anicteric. No rhinorrhea. Cardiovascular: RRR. No M/R/G Respiratory: CTAB, normal WOB on RA. No wheezing, crackles, rhonchi, or diminished breath sounds. Abdomen: Soft, non-tender, non-distended. Bowel sounds normoactive Extremities: Able to move all extremities. No BLE edema, no deformities or significant joint findings. Skin: Warm and dry. No abrasions or rashes noted. Neuro: A&Ox3. No focal neurological deficits. F GU: Normally developed genitalia with no external lesions or eruptions. Vagina and cervix show no lesions, inflammation, discharge or tenderness. Blood noted in vaginal vault and from cervix. Chaperoned by CMA Rico Pesa.   ASSESSMENT/PLAN:   Assessment & Plan Vaginal bleeding Vaginal bleeding possibly secondary to restarting menstrual cycle postpartum since she is no longer breast-feeding vs complication d/t hx of CIN-1 2/2 early return to intercourse postpartum. - ED precautions provided for excessive vaginal bleeding with presentation of symptoms of dizziness or lightheadedness - POCT urine pregnancy and hemoglobin checked today, negative and  stable respectively - Transvaginal pelvic ultrasound ordered and scheduled by CMA - Pap smear performed along with pelvic exam since patient is overdue with history of CIN-1, will follow-up results   Rhonda Melena, DO [facility]

## 2024-04-11 ENCOUNTER — Ambulatory Visit
Admission: RE | Admit: 2024-04-11 | Discharge: 2024-04-11 | Disposition: A | Discharge status: Home / Self Care | Source: Ambulatory Visit | Attending: Family Medicine | Admitting: Family Medicine

## 2024-04-11 DIAGNOSIS — N838 Other noninflammatory disorders of ovary, fallopian tube and broad ligament: Secondary | ICD-10-CM | POA: Diagnosis not present

## 2024-04-11 DIAGNOSIS — N939 Abnormal uterine and vaginal bleeding, unspecified: Secondary | ICD-10-CM | POA: Diagnosis not present

## 2024-04-18 LAB — CYTOLOGY - PAP
Chlamydia: NEGATIVE
Comment: NEGATIVE
Comment: NEGATIVE
Comment: NEGATIVE
Comment: NORMAL
Diagnosis: UNDETERMINED — AB
High risk HPV: POSITIVE — AB
Neisseria Gonorrhea: NEGATIVE
Trichomonas: NEGATIVE

## 2024-04-19 ENCOUNTER — Ambulatory Visit

## 2024-04-19 VITALS — BP 103/70 | HR 79 | Ht 64.0 in | Wt 201.8 lb

## 2024-04-19 DIAGNOSIS — R6339 Other feeding difficulties: Secondary | ICD-10-CM | POA: Diagnosis not present

## 2024-04-19 NOTE — Patient Instructions (Addendum)
   WIC: [phone] ([place]);  [phone] ([place])  [place]:  [phone]  [facility] Lactation  [phone]  [facility] Breastfeeding Hotline:  [phone]  Affordable Care Act guarantees new mothers be allowed time and a private place for pumping breast milk.  - see [email or web address]

## 2024-04-19 NOTE — Progress Notes (Signed)
  [facility] Postpartum Visit   Rhonda Dixon is a 23 y.o. [id number] presenting for a postpartum visit.  She has the following concerns today: none She delivered via vaginal at [redacted]w[redacted]d.  She reports no more vaginal bleeding. She is bottle formula feeding her infant. She feels she is bonding well. She is not interested in contraception.  Patient has good support from boyfriend and mother.  They help take care of baby.  Edinburgh Postnatal Depression Scale: 14 (10 or higher is positive), patient on 10 mg Lexapro , states that this helped her mood. Reviewed pregnancy and delivery course.  -  Vitals:   04/19/24 1336  BP: 103/70  Pulse: 79  SpO2: 98%   Exam:  General: Well-appearing, no acute distress Cardiovascular: RRR, no M/R/G Respiratory: CTAB, normal work of breathing on room air Abdomen: Soft, nondistended, nontender to palpation, bowel sounds present Extremities: Warm, dry, no edema, 2+ posterior tibial pulses bilaterally  A/P:  Postpartum visit: patient is 6 weeks postpartum following a vaginal delivery. -Discussed patients delivery and complications -Patient had a bilateral periurethral laceration, perineal healing reviewed. Patient expressed understanding -Patient has urinary incontinence? No -Patient is safe to resume physical and sexual activity -Patient does not want a pregnancy in the next year.  Desired family size is 3 children.  -Reviewed forms of contraception in tiered fashion. Patient is not interested in any form of contraception.  -Return to sexual activity and contraception discussed as above.  -Discussed birth spacing of 18 months -Breastfeeding: No, provided support of decision and resources as indicated  -Mood: doing well with medication   -Discussed sleep and fatigue management and encouraged family/community support.  -Postpartum vaccines: not indicated at this time -Need for postpartum diabetes screening:  no (2 hour glucose tolerance  testing between 6-12 weeks postpartum)  -Reviewed prior Pap and she is next due for Pap 1 year.  -Return in No follow-ups on file.

## 2024-04-20 ENCOUNTER — Ambulatory Visit: Payer: Self-pay | Admitting: Family Medicine

## 2024-04-20 LAB — PROLACTIN: Prolactin: 26.4 ng/mL (ref 4.8–33.4)

## 2024-04-23 ENCOUNTER — Encounter

## 2024-04-23 ENCOUNTER — Ambulatory Visit: Payer: Self-pay

## 2024-04-27 ENCOUNTER — Ambulatory Visit (INDEPENDENT_AMBULATORY_CARE_PROVIDER_SITE_OTHER)

## 2024-04-27 VITALS — BP 110/75 | HR 74 | Ht 64.0 in | Wt 197.8 lb

## 2024-04-27 DIAGNOSIS — Z0000 Encounter for general adult medical examination without abnormal findings: Secondary | ICD-10-CM

## 2024-04-27 DIAGNOSIS — Z111 Encounter for screening for respiratory tuberculosis: Secondary | ICD-10-CM

## 2024-04-27 DIAGNOSIS — Z30011 Encounter for initial prescription of contraceptive pills: Secondary | ICD-10-CM

## 2024-04-27 MED ORDER — NORGESTIMATE-ETH ESTRADIOL 0.25-35 MG-MCG PO TABS: 1.0000 | ORAL_TABLET | Freq: Every day | ORAL | 11 refills | Status: AC

## 2024-04-27 NOTE — Patient Instructions (Addendum)
 I sent your birth control pills into your pharmacy. You can start taking them when you pick them up.

## 2024-04-27 NOTE — Progress Notes (Signed)
    SUBJECTIVE:   Chief compliant/HPI: annual examination  Rhonda Dixon is a 23 y.o. who presents today for an annual exam.   Patient states she is doing well. 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 She brought in a school form to be filled out today with immunization titers and requiring a PPD test. She is also interested in started birth control pills today. She had a normal spontaneous vaginal delivery on 03/06/24 and has not had her period since. She has no complaints at this time.   Updated history tabs and problem list.   OBJECTIVE:   BP 110/75   Pulse 74   Ht 5' 4 (1.626 m)   Wt 197 lb 12.8 oz (89.7 kg)   LMP 05/30/2023   SpO2 100%   BMI 33.95 kg/m   General: well appearing female, no acute distress HEENT: head normocephalic, atraumatic, eye non-icteric, pupils equal, round, reactive to light, ear canals clear, tympanic membranes with good light reflex bilaterally, no septal deviation, nasal passages clear without discharge, throat non-erythematous, without exudates  Neck: no lymphadenopathy, no thyroid nodules, no neck masses  Cardiovascular: RRR, no m/r/g/ Respiratory: CTAB, normal work of breathing on room air  Abdomen: soft, non-distended, non-tender to palpation, bowel sounds present  Extremities: 2+ posterior tibialis pulses bilaterally, no edema  ASSESSMENT/PLAN:   Assessment & Plan Annual physical exam Healthy 23 year old female.  Oral contraception initiation Sprintec ordered today.  PPD screening test Requests PPD screening for school. Informed patient she would have to get test done next week, as we cannot follow-up 48 hours from today as it is a Friday. Patient informed to schedule nurse visit for this on her way out.  Annual Examination  See AVS for age appropriate recommendations.   PHQ score 12, reviewed and discussed. Blood pressure reviewed and at goal.  Asked about intimate partner violence and patient reports  none.  The patient is interested in starting oral contraception pills today.   Cervical cancer screening: prior Pap reviewed, repeat due in 1 year Immunizations up to date  MyChart Activation:Already signed up   Follow up in 1 year.    Raguel KANDICE Lee, DO [facility]

## 2024-05-02 ENCOUNTER — Ambulatory Visit (INDEPENDENT_AMBULATORY_CARE_PROVIDER_SITE_OTHER)

## 2024-05-02 DIAGNOSIS — Z111 Encounter for screening for respiratory tuberculosis: Secondary | ICD-10-CM

## 2024-05-02 NOTE — Progress Notes (Signed)
 Patient presents to nurse clinic for PPD placement.  PPD placed in left forearm. Patient to return on 8/22 @ 11a to have site read.  Form is in PCP box.

## 2024-05-04 ENCOUNTER — Ambulatory Visit

## 2024-05-04 DIAGNOSIS — Z111 Encounter for screening for respiratory tuberculosis: Secondary | ICD-10-CM

## 2024-05-04 LAB — TB SKIN TEST
Induration: 0 mm
TB Skin Test: NEGATIVE

## 2024-05-04 NOTE — Progress Notes (Signed)
 Patient is here for a PPD read.  It was placed on 05/02/2024 in the left forearm @ 11:00 am.    PPD RESULTS:  Result: negative Induration: 0 mm  Letter created and given to patient for documentation purposes. Chiquita JAYSON English, RN

## 2024-05-09 ENCOUNTER — Telehealth: Payer: Self-pay | Admitting: Clinical

## 2024-05-09 NOTE — Telephone Encounter (Signed)
Attempt call regarding referral; Left HIPPA-compliant message to call back Mikle Sternberg from [facility] at  [phone] (Ashelyn Mccravy's office).    

## 2024-06-08 ENCOUNTER — Ambulatory Visit: Admitting: Family Medicine

## 2024-06-08 ENCOUNTER — Other Ambulatory Visit (HOSPITAL_COMMUNITY)
Admission: RE | Admit: 2024-06-08 | Discharge: 2024-06-08 | Disposition: A | Discharge status: Home / Self Care | Source: Ambulatory Visit | Attending: Family Medicine | Admitting: Family Medicine

## 2024-06-08 VITALS — BP 110/71 | HR 97 | Ht 64.0 in | Wt 200.8 lb

## 2024-06-08 DIAGNOSIS — N898 Other specified noninflammatory disorders of vagina: Secondary | ICD-10-CM | POA: Diagnosis present

## 2024-06-08 DIAGNOSIS — R102 Pelvic and perineal pain: Secondary | ICD-10-CM | POA: Diagnosis not present

## 2024-06-08 LAB — POCT WET PREP (WET MOUNT)
Clue Cells Wet Prep Whiff POC: NEGATIVE
Trichomonas Wet Prep HPF POC: ABSENT
WBC, Wet Prep HPF POC: >20

## 2024-06-08 NOTE — Assessment & Plan Note (Signed)
 Exam suggestive of BV but wet prep negative for clue cells.  Will follow-up G/C swab but if negative with persistent symptoms will consider BV treatment.  Recently sure not pregnant, recommend starting Sprintec as prescribed and follow-up as needed discussed other birth control options.

## 2024-06-08 NOTE — Progress Notes (Signed)
    SUBJECTIVE:   CHIEF COMPLAINT / HPI:   Vaginal discharge PP 3 months, delivered 03/06/2024.  Reports discharge started the past couple of days.  Denies vaginal irritation.  Denies dysuria, suprapubic pain and urinary frequency.  Last menstrual cycle around 05/27/2024, has not been sexually active since that time.  Has not been taking Sprintec, considering starting.  She does report ongoing chronic pelvic pain since the delivery of her son a few months back.  Reports she had prior pelvic ultrasound done which showed a small set of endometrium cyst but otherwise reassuring.  Reports she does get pelvic pain and pressure even with simple activity such as walking.  PERTINENT  PMH / PSH: G3P2  OBJECTIVE:   BP 110/71   Pulse 97   Ht 5' 4 (1.626 m)   Wt 200 lb 12.8 oz (91.1 kg)   LMP 05/25/2024 (Approximate)   SpO2 98%   BMI 34.47 kg/m    General: NAD, pleasant, able to participate in exam Pelvic exam: normal external genitalia, vulva, vagina, cervix, uterus and adnexa, mild atrophic changes of the vagina noted, VAGINA: normal appearing vagina with normal color and discharge, no lesions, vaginal discharge - white, copious, and creamy, exam chaperoned by Arlana, CMA.   ASSESSMENT/PLAN:   Assessment & Plan Vaginal discharge Exam suggestive of BV but wet prep negative for clue cells.  Will follow-up G/C swab but if negative with persistent symptoms will consider BV treatment.  Recently sure not pregnant, recommend starting Sprintec as prescribed and follow-up as needed discussed other birth control options. Pelvic pain Ongoing since delivery of her child in 02/2024, with mild atrophy upon exam suspicious for pelvic floor dysfunction given recent reassuring pelvic ultrasound. -Trial pelvic floor PT   Dr. Izetta Nap, DO [facility]

## 2024-06-08 NOTE — Patient Instructions (Signed)
 It was wonderful to see you today! Thank you for choosing [facility].   Please bring ALL of your medications with you to every visit.   Today we talked about:  We got vaginal swabs today and I will follow-up with you regarding those results.  Your symptoms were most consistent with BV so I am suspicious that is what you have. You can start the Sprintec if you would like for birth control.  If it does not work for you can give us  a call and we can discuss other birth control options.  Please keep in mind to use backup contraception such as condoms for the first week after starting the pill and you can get some spotting. For your pelvic pain I am suspicious you might have some pelvic floor dysfunction after delivery which is very common.  Usually the best course of action for this is to do pelvic floor physical therapy so they can do a thorough assessment and see if this might be contributing to your symptoms.  Please follow up as needed for persistent symptoms  We are checking some labs today. If they are abnormal, I will call you. If they are normal, I will send you a MyChart message (if it is active) or a letter in the mail. If you do not hear about your labs in the next 2 weeks, please call the office.  Call the clinic at [phone] if your symptoms worsen or you have any concerns.  Please be sure to schedule follow up at the front desk before you leave today.   Izetta Nap, DO Family Medicine

## 2024-06-11 ENCOUNTER — Ambulatory Visit: Payer: Self-pay | Admitting: Family Medicine

## 2024-06-11 LAB — CERVICOVAGINAL ANCILLARY ONLY
Chlamydia: NEGATIVE
Comment: NEGATIVE
Comment: NORMAL
Neisseria Gonorrhea: NEGATIVE

## 2024-06-12 ENCOUNTER — Telehealth: Payer: Self-pay

## 2024-06-12 DIAGNOSIS — N898 Other specified noninflammatory disorders of vagina: Secondary | ICD-10-CM

## 2024-06-12 MED ORDER — METRONIDAZOLE 0.75 % VA GEL: 1.0000 | Freq: Every day | VAGINAL | 0 refills | Status: AC

## 2024-06-12 NOTE — Telephone Encounter (Signed)
 Patient LVM on nurse line requesting returned call.   Returned call to patient at number in which she provided.   She did not answer, LVM asking that she return call to office.   Chiquita JAYSON English, RN

## 2024-06-12 NOTE — Telephone Encounter (Signed)
 Patient presents to clinic for follow up.   Brought patient back to room to discuss.   Patient has questions about her results from last visit.   She endorses continued vaginal odor and thick white vaginal discharge.   Denies fever.   Reports that pelvic patient has been intermittent.   She would like to go ahead and receive treatment for BV.   Preferred pharmacy is [facility] on [address].   Will forward to Dr. Theophilus.   Chiquita JAYSON English, RN

## 2024-06-12 NOTE — Addendum Note (Signed)
 Addended by: Breckon Reeves on: 06/12/2024 05:14 PM   Modules accepted: Orders

## 2024-08-31 ENCOUNTER — Ambulatory Visit: Attending: Family Medicine | Admitting: Physical Therapy

## 2024-08-31 NOTE — Therapy (Incomplete)
 " OUTPATIENT PHYSICAL THERAPY FEMALE PELVIC EVALUATION   Patient Name: Rhonda Dixon MRN: [id number] DOB:11-24-2000, 23 y.o., female Today's Date: 08/31/2024  END OF SESSION:   Past Medical History:  Diagnosis Date   ALLERGIC RHINITIS, SEASONAL 04/01/2009   Qualifier: Diagnosis of   By: Adella MD, Elizabeth         Anxiety    Cold sore 10/11/2022   Ear itch 06/06/2023   Encounter for counseling regarding contraception 07/23/2021   Medical history non-contributory    Past Surgical History:  Procedure Laterality Date   INDUCED ABORTION  12/2022   Patient Active Problem List   Diagnosis Date Noted   NSVD (normal spontaneous vaginal delivery) 03/06/2024   Obesity affecting pregnancy, antepartum 11/03/2023   Headache 06/06/2023   TMJ (temporomandibular joint disorder) 06/06/2023   Pregnancy 11/30/2022   History of abnormal cervical Pap smear 04/28/2022   Generalized anxiety disorder 10/23/2018   BV (bacterial vaginosis) 08/17/2017   Vaginal discharge 07/12/2017    PCP: Lennie Raguel MATSU, DO  REFERRING PROVIDER: McDiarmid, Krystal BIRCH, MD  REFERRING DIAG: R10.2 (ICD-10-CM) - Pelvic pain  THERAPY DIAG:  No diagnosis found.  Rationale for Evaluation and Treatment: Rehabilitation  ONSET DATE: ***  SUBJECTIVE:                                                                                                                                                                                           SUBJECTIVE STATEMENT: *** Fluid intake:   FUNCTIONAL LIMITATIONS: ***  PERTINENT HISTORY:  Medications for current condition: *** Surgeries: *** Other: *** Sexual abuse: {Yes/No:304960894}  PAIN:  Are you having pain? {yes/no:20286} NPRS scale: ***/10 Pain location: {pelvic pain location:27098}  Pain type: {type:313116} Pain description: {PAIN DESCRIPTION:21022940}   Aggravating factors: *** Relieving factors: ***  PRECAUTIONS: {Therapy precautions:24002}  RED  FLAGS: {PT Red Flags:29287}   WEIGHT BEARING RESTRICTIONS: {Yes ***/No:24003}  FALLS:  Has patient fallen in last 6 months? {fallsyesno:27318}  OCCUPATION: ***  ACTIVITY LEVEL : ***  PLOF: {PLOF:24004}  PATIENT GOALS: ***   BOWEL MOVEMENT: Pain with bowel movement: {yes/no:20286} Type of bowel movement:{PT BM type:27100} Fully empty rectum: {No/Yes:304960894} Leakage: {Yes/No:304960894}                                                  Caused by: *** Bowel urgency: *** Pads: {Yes/No:304960894} Fiber supplement/laxative {YES/NO AS:20300}  URINATION: Pain with urination: {yes/no:20286} Fully empty bladder: {Yes/No:304960894}***  Post-void dribble: {YES/NO AS:20300} Stream: {PT urination:27102} Urgency: {YES/NO AS:20300} Frequency:during the day ***                                                        Nocturia: {Yes/No:304960894}***   Leakage: {PT leakage:27103} Pads/briefs: {Yes/No:304960894}  INTERCOURSE:  Ability to have vaginal penetration {YES/NO:21197} Pain with intercourse: {pain with intercourse PA:27099} Dryness: {YES/NO AS:20300} Climax: *** Marinoff Scale: ***/3 Lubricant:  PREGNANCY: Vaginal deliveries *** Tearing {Yes***/No:304960894} Episiotomy {YES/NO AS:20300} C-section deliveries *** Currently pregnant {Yes***/No:304960894}  PROLAPSE: {PT prolapse:27101}   OBJECTIVE:  Note: Objective measures were completed at Evaluation unless otherwise noted.  DIAGNOSTIC FINDINGS:  Post-void residual: Voiding Cystourethrogram (VCUG):  Ultrasound: ***  PATIENT SURVEYS:  {rehab surveys:24030}  PFIQ-7: *** UIQ-7 *** CRAIG -7 *** POPIQ-7 *** Female Sexual Function Index (FSFI) Questionnaire ***  COGNITION: Overall cognitive status: {cognition:24006}     SENSATION: Light touch: {intact/deficits:24005}  LUMBAR SPECIAL TESTS:  {lumbar special test:25242}  FUNCTIONAL TESTS:  {Functional  tests:24029} Single leg stance:  Rt:  Lt: Sit-up test: Squat: Bed mobility:  GAIT: Assistive device utilized: {Assistive devices:23999} Comments: ***  POSTURE: {posture:25561}   LUMBARAROM/PROM:  A/PROM A/PROM  Eval (% available)  Flexion   Extension   Right lateral flexion   Left lateral flexion   Right rotation   Left rotation    (Blank rows = not tested)  LOWER EXTREMITY ROM:  {AROM/PROM:27142} ROM Right eval Left eval  Hip flexion    Hip extension    Hip abduction    Hip adduction    Hip internal rotation    Hip external rotation    Knee flexion    Knee extension    Ankle dorsiflexion    Ankle plantarflexion    Ankle inversion    Ankle eversion     (Blank rows = not tested)  LOWER EXTREMITY MMT:  MMT Right eval Left eval  Hip flexion    Hip extension    Hip abduction    Hip adduction    Hip internal rotation    Hip external rotation    Knee flexion    Knee extension    Ankle dorsiflexion    Ankle plantarflexion    Ankle inversion    Ankle eversion     (Blank rows = not tested) PALPATION:  General: ***  Pelvic Alignment: ***  Abdominal: ***  Diastasis: {Yes/No:304960894}*** Distortion: {YES/NO AS:20300}  Breathing: *** Scar tissue: {Yes/No:304960894}*** Active Straight Leg Raise: ***                External Perineal Exam: ***                             Internal Pelvic Floor: ***  Patient confirms identification and approves PT to assess internal pelvic floor and treatment {yes/no:20286} All internal or external pelvic floor assessments and/or treatments are completed with proper hand hygiene and gloves hands. If needed gloves are changed with hand hygiene during patient care time.  PELVIC MMT:   MMT eval  Vaginal   Internal Anal Sphincter   External Anal Sphincter   Puborectalis   (Blank rows = not tested)        TONE: ***  PROLAPSE: ***  TODAY'S TREATMENT:  DATE: ***  EVAL ***   PATIENT EDUCATION:  Education details: *** Person educated: {Person educated:25204} Education method: {Education Method:25205} Education comprehension: {Education Comprehension:25206}  HOME EXERCISE PROGRAM: ***  ASSESSMENT:  CLINICAL IMPRESSION: Patient is a *** y.o. *** who was seen today for physical therapy evaluation and treatment for ***.   OBJECTIVE IMPAIRMENTS: {opptimpairments:25111}.   ACTIVITY LIMITATIONS: {activitylimitations:27494}  PARTICIPATION LIMITATIONS: {participationrestrictions:25113}  PERSONAL FACTORS: {Personal factors:25162} are also affecting patient's functional outcome.   REHAB POTENTIAL: {rehabpotential:25112}  CLINICAL DECISION MAKING: {clinical decision making:25114}  EVALUATION COMPLEXITY: {Evaluation complexity:25115}   GOALS: Goals reviewed with patient? {yes/no:20286}  SHORT TERM GOALS: Target date: ***  *** Baseline: Goal status: INITIAL  2.  *** Baseline:  Goal status: INITIAL  3.  *** Baseline:  Goal status: INITIAL  4.  *** Baseline:  Goal status: INITIAL  5.  *** Baseline:  Goal status: INITIAL  6.  *** Baseline:  Goal status: INITIAL  LONG TERM GOALS: Target date: ***  *** Baseline:  Goal status: INITIAL  2.  *** Baseline:  Goal status: INITIAL  3.  *** Baseline:  Goal status: INITIAL  4.  *** Baseline:  Goal status: INITIAL  5.  *** Baseline:  Goal status: INITIAL  6.  *** Baseline:  Goal status: INITIAL  PLAN:  PT FREQUENCY: {rehab frequency:25116}  PT DURATION: {rehab duration:25117}  PLANNED INTERVENTIONS: {rehab planned interventions:25118::97110-Therapeutic exercises,97530- Therapeutic [phone]- Neuromuscular re-education,97535- Self Rjmz,02859- Manual therapy,Patient/Family education}  PLAN FOR NEXT SESSION: ***   Kiki Bivens, PT 08/31/2024, 7:49 AM  "

## 2024-09-04 ENCOUNTER — Telehealth: Payer: Self-pay | Admitting: Physical Therapy

## 2024-09-04 NOTE — Telephone Encounter (Signed)
 Called patient re no show for PT eval, she reported that she called and rescheduled for 09/26/24

## 2024-09-11 ENCOUNTER — Ambulatory Visit: Payer: Self-pay | Admitting: Family Medicine

## 2024-09-26 ENCOUNTER — Ambulatory Visit: Attending: Family Medicine

## 2024-09-26 NOTE — Progress Notes (Unsigned)
 " OUTPATIENT PHYSICAL THERAPY FEMALE PELVIC EVALUATION   Patient Name: Rhonda Dixon MRN: [id number] DOB:2001-03-30, 24 y.o., female Today's Date: 09/26/2024  END OF SESSION:   Past Medical History:  Diagnosis Date   ALLERGIC RHINITIS, SEASONAL 04/01/2009   Qualifier: Diagnosis of   By: Adella MD, Elizabeth         Anxiety    Cold sore 10/11/2022   Ear itch 06/06/2023   Encounter for counseling regarding contraception 07/23/2021   Medical history non-contributory    Past Surgical History:  Procedure Laterality Date   INDUCED ABORTION  12/2022   Patient Active Problem List   Diagnosis Date Noted   NSVD (normal spontaneous vaginal delivery) 03/06/2024   Obesity affecting pregnancy, antepartum 11/03/2023   Headache 06/06/2023   TMJ (temporomandibular joint disorder) 06/06/2023   Pregnancy 11/30/2022   History of abnormal cervical Pap smear 04/28/2022   Generalized anxiety disorder 10/23/2018   BV (bacterial vaginosis) 08/17/2017   Vaginal discharge 07/12/2017    PCP: Lennie Raguel MATSU, DO   REFERRING PROVIDER: McDiarmid, Krystal BIRCH, MD   REFERRING DIAG: R10.2 (ICD-10-CM) - Pelvic pain  THERAPY DIAG:  No diagnosis found.  Rationale for Evaluation and Treatment: Rehabilitation  ONSET DATE: ***  SUBJECTIVE:                                                                                                                                                                                           SUBJECTIVE STATEMENT: ***   PAIN:  Are you having pain? {yes/no:20286} NPRS scale: ***/10 Pain location: {pelvic pain location:27098}  Pain type: {type:313116} Pain description: {PAIN DESCRIPTION:21022940}   Aggravating factors: *** Relieving factors: ***  PRECAUTIONS: None  RED FLAGS: None   WEIGHT BEARING RESTRICTIONS: No  FALLS:  Has patient fallen in last 6 months? No  OCCUPATION: ***  ACTIVITY LEVEL : ***  PLOF: Independent  PATIENT GOALS:  ***  PERTINENT HISTORY:  Anxiety, vaginal delivery,  Sexual abuse: {Yes/No:304960894}  BOWEL MOVEMENT: Pain with bowel movement: {yes/no:20286} Type of bowel movement:{PT BM type:27100} Fully empty rectum: {No/Yes:304960894} Leakage: {Yes/No:304960894} Urgency: {Yes/No:304960894} Pads: {Yes/No:304960894} Fiber supplement/laxative {YES/NO AS:20300}  URINATION: Pain with urination: {yes/no:20286} Fully empty bladder: {Yes/No:304960894} Stream: {PT urination:27102} Urgency: {YES/NO AS:20300} Frequency: *** Nocturia: *** Fluid Intake: *** Leakage: {PT leakage:27103} Pads: {Yes/No:304960894}  INTERCOURSE:  Ability to have vaginal penetration {YES/NO:21197} Pain with intercourse: {pain with intercourse PA:27099} Dryness{YES/NO AS:20300} Climax: *** Marinoff Scale: ***/3 Lubricant: ***  PREGNANCY: Vaginal deliveries *** Tearing {Yes***/No:304960894} Episiotomy {YES/NO AS:20300} C-section deliveries *** Currently pregnant {Yes***/No:304960894}  PROLAPSE: {PT prolapse:27101}   OBJECTIVE:  Note: Objective measures were completed at Evaluation unless otherwise  noted.  09/26/2024 PATIENT SURVEYS:   PFIQ-7: ***  COGNITION: Overall cognitive status: Within functional limits for tasks assessed     SENSATION: Light touch: Appears intact   FUNCTIONAL TESTS:  Squat: *** Single leg stance:  Rt: ***  Lt: *** Curl-up test: *** Sit-up test: *** Active straight leg raise: ***   GAIT: Assistive device utilized: {Assistive devices:23999} Comments: ***  POSTURE: {posture:25561}   LUMBARAROM/PROM:  A/PROM A/PROM  Eval (% available)  Flexion   Extension   Right lateral flexion   Left lateral flexion   Right rotation   Left rotation    (Blank rows = not tested)  PALPATION: General: ***  Abdominal: Sternocostal angle: *** Breathing: *** Tenderness: *** Scar tissue: *** Diastasis: ***                External Perineal Exam: ***                              Internal Pelvic Floor: ***  Patient confirms identification and approves PT to assess internal pelvic floor and treatment {yes/no:20286}  PELVIC MMT:   MMT eval  Vaginal   Internal Anal Sphincter   External Anal Sphincter   Puborectalis   (Blank rows = not tested)        TONE: ***  PROLAPSE: ***  TODAY'S TREATMENT:                                                                                                                              DATE:  09/26/2024 EVAL  Manual:  Neuromuscular re-education:  Exercises:  Therapeutic activities:     PATIENT EDUCATION:  Education details: See above Person educated: Patient Education method: Explanation, Demonstration, Tactile cues, Verbal cues, and Handouts Education comprehension: verbalized understanding  HOME EXERCISE PROGRAM: ***  ASSESSMENT:  CLINICAL IMPRESSION: Patient is a 24 y.o. female who was seen today for physical therapy evaluation and treatment for ***.   OBJECTIVE IMPAIRMENTS: decreased activity tolerance, decreased coordination, decreased endurance, decreased mobility, decreased ROM, decreased strength, increased fascial restrictions, increased muscle spasms, impaired flexibility, impaired tone, improper body mechanics, postural dysfunction, and pain.   ACTIVITY LIMITATIONS: {activitylimitations:27494}  PARTICIPATION LIMITATIONS: {participationrestrictions:25113}  PERSONAL FACTORS: {Personal factors:25162} are also affecting patient's functional outcome.   REHAB POTENTIAL: {rehabpotential:25112}  CLINICAL DECISION MAKING: {clinical decision making:25114}  EVALUATION COMPLEXITY: {Evaluation complexity:25115}   GOALS: Goals reviewed with patient? Yes  SHORT TERM GOALS: Target date: 10/24/2024   Pt will be independent with HEP in order to improve activity tolerance.   Baseline: Goal status: INITIAL  2.  ***  Baseline:  Goal status: INITIAL  3.  ***  Baseline:  Goal status:  INITIAL  4.  *** Baseline:  Goal status: INITIAL  5.  *** Baseline:  Goal status: INITIAL  6.  *** Baseline:  Goal status: INITIAL  LONG TERM GOALS: Target date: ***  Pt will be independent  with advanced HEP in order to improve activity tolerance.   Baseline:  Goal status: INITIAL  2.  *** Baseline:  Goal status: INITIAL  3.  *** Baseline:  Goal status: INITIAL  4.  *** Baseline:  Goal status: INITIAL  5.  *** Baseline:  Goal status: INITIAL  6.  *** Baseline:  Goal status: INITIAL  PLAN:  PT FREQUENCY: 1-2x/week  PT DURATION: ***   PLANNED INTERVENTIONS: 97164- PT Re-evaluation, 97110-Therapeutic exercises, 97530- Therapeutic activity, 97112- Neuromuscular re-education, 97535- Self Care, 02859- Manual therapy, [id number]- Gait training, [phone]- Aquatic Therapy, H9716- Electrical stimulation (unattended), [id number]- Traction (mechanical), [id number]- Ionotophoresis 4mg /ml Dexamethasone, 79439 (1-2 muscles), 20561 (3+ muscles)- Dry Needling, Patient/Family education, Balance training, Taping, Joint mobilization, Joint manipulation, Spinal manipulation, Spinal mobilization, Scar mobilization, Vestibular training, Cryotherapy, Moist heat, and Biofeedback  PLAN FOR NEXT SESSION: ***  Josette Mares, PT, DPT1/14/20268:36 AM [facility] [address], [address] [place], [place] [address] Phone # [phone] Fax [phone]   "

## 2024-10-02 ENCOUNTER — Encounter: Payer: Self-pay | Admitting: Student

## 2024-10-02 ENCOUNTER — Ambulatory Visit: Admitting: Student

## 2024-10-02 VITALS — BP 120/77 | HR 94 | Ht 64.0 in | Wt 211.1 lb

## 2024-10-02 DIAGNOSIS — Z3200 Encounter for pregnancy test, result unknown: Secondary | ICD-10-CM

## 2024-10-02 DIAGNOSIS — Z3490 Encounter for supervision of normal pregnancy, unspecified, unspecified trimester: Secondary | ICD-10-CM

## 2024-10-02 LAB — POCT URINE PREGNANCY: Preg Test, Ur: POSITIVE — AB

## 2024-10-02 NOTE — Progress Notes (Signed)
" ° ° °  SUBJECTIVE:   CHIEF COMPLAINT / HPI:   Missed menses Unknown LMP, possibly October or November. Recent SVD on June/2025 G3P2 Is not certain that she wants to follow-up with her office for prenatal care, she will call to let us  know Deferred blood work today per patient preference  OBJECTIVE:   BP 120/77   Pulse 94   Ht 5' 4 (1.626 m)   Wt 211 lb 2 oz (95.8 kg)   SpO2 98%   BMI 36.24 kg/m    General: NAD, well-appearing, well-nourished Respiratory: No respiratory distress, breathing comfortably, able to speak in full sentences Skin: warm and dry, no rashes noted on exposed skin Psych: Appropriate affect and mood  ASSESSMENT/PLAN:   Assessment & Plan Pregnancy, unspecified gestational age - Positive pregnancy, unreliable dating - Patient will call office to schedule initial OB if she desires to continue care with us     Gladis Church, DO [facility]  "

## 2024-10-02 NOTE — Assessment & Plan Note (Signed)
-   Positive pregnancy, unreliable dating - Patient will call office to schedule initial OB if she desires to continue care with us

## 2024-10-02 NOTE — Patient Instructions (Signed)
 It was great to see you! Thank you for allowing me to participate in your care!   I recommend that you always bring your medications to each appointment as this makes it easy to ensure we are on the correct medications and helps us  not miss when refills are needed.  Our plans for today:  - You are pregnant - Please call our office later today or this week depending on your decisions  Take care and seek immediate care sooner if you develop any concerns. Please remember to show up 15 minutes before your scheduled appointment time!  Gladis Church, DO [facility]

## 2024-10-05 ENCOUNTER — Telehealth: Payer: Self-pay

## 2024-10-05 DIAGNOSIS — Z3490 Encounter for supervision of normal pregnancy, unspecified, unspecified trimester: Secondary | ICD-10-CM

## 2024-10-05 NOTE — Telephone Encounter (Signed)
 Patient calls nurse line in regards to scheduling ultrasound as soon as possible.   She reports that she is planning on termination of pregnancy and needs an ultrasound to determine dating.   She also wanted to let Dr. Howell know that the facility in [place] does not require referral.   Forwarding to Dr. Howell for ultrasound order placement.   Chiquita JAYSON English, RN

## 2024-10-09 ENCOUNTER — Other Ambulatory Visit: Payer: Self-pay | Admitting: Family Medicine

## 2024-10-09 ENCOUNTER — Ambulatory Visit (HOSPITAL_COMMUNITY)
Admission: RE | Admit: 2024-10-09 | Discharge: 2024-10-09 | Disposition: A | Discharge status: Home / Self Care | Source: Ambulatory Visit | Attending: Family Medicine | Admitting: Family Medicine

## 2024-10-09 DIAGNOSIS — Z3491 Encounter for supervision of normal pregnancy, unspecified, first trimester: Secondary | ICD-10-CM | POA: Insufficient documentation

## 2024-10-09 DIAGNOSIS — Z3A10 10 weeks gestation of pregnancy: Secondary | ICD-10-CM | POA: Insufficient documentation

## 2024-10-09 DIAGNOSIS — Z3490 Encounter for supervision of normal pregnancy, unspecified, unspecified trimester: Secondary | ICD-10-CM

## 2024-10-09 NOTE — Telephone Encounter (Signed)
 Patient has US  at 11:30 today

## 2024-10-11 NOTE — Telephone Encounter (Signed)
 Patient returns call to nurse line regarding her ultrasound results.   She states that she was able to view in [place] but wanted to verify her gestational age. Advised patient that per ultrasound, she was 10w 1 day.   She is asking for further advisement on proceeding with termination.   Advised that I would send message to provider for further advisement.   Chiquita JAYSON English, RN

## 2024-11-02 ENCOUNTER — Ambulatory Visit: Payer: Self-pay
